# Patient Record
Sex: Male | Born: 1975 | Race: White | Hispanic: No | Marital: Married | Smoking: Current every day smoker
Health system: Southern US, Community
[De-identification: ages and names within clinical notes are randomized; demographics above are authoritative.]

## PROBLEM LIST (undated history)

## (undated) DIAGNOSIS — E119 Type 2 diabetes mellitus without complications: Secondary | ICD-10-CM

## (undated) DIAGNOSIS — F1910 Other psychoactive substance abuse, uncomplicated: Secondary | ICD-10-CM

## (undated) DIAGNOSIS — I10 Essential (primary) hypertension: Secondary | ICD-10-CM

## (undated) DIAGNOSIS — F419 Anxiety disorder, unspecified: Secondary | ICD-10-CM

## (undated) DIAGNOSIS — R569 Unspecified convulsions: Secondary | ICD-10-CM

## (undated) HISTORY — PX: HERNIA REPAIR: SHX51

## (undated) HISTORY — DX: Anxiety disorder, unspecified: F41.9

## (undated) HISTORY — DX: Unspecified convulsions: R56.9

## (undated) HISTORY — DX: Other psychoactive substance abuse, uncomplicated: F19.10

---

## 2005-03-18 ENCOUNTER — Emergency Department (HOSPITAL_COMMUNITY): Admission: EM | Admit: 2005-03-18 | Discharge: 2005-03-18 | Payer: Self-pay | Admitting: Emergency Medicine

## 2005-03-31 ENCOUNTER — Encounter: Payer: Self-pay | Admitting: Internal Medicine

## 2006-02-16 ENCOUNTER — Ambulatory Visit: Payer: Self-pay | Admitting: Internal Medicine

## 2006-02-18 ENCOUNTER — Ambulatory Visit (HOSPITAL_COMMUNITY): Admission: RE | Admit: 2006-02-18 | Discharge: 2006-02-18 | Payer: Self-pay | Admitting: Internal Medicine

## 2006-03-02 ENCOUNTER — Ambulatory Visit: Payer: Self-pay | Admitting: Internal Medicine

## 2006-08-29 ENCOUNTER — Ambulatory Visit (HOSPITAL_COMMUNITY): Admission: RE | Admit: 2006-08-29 | Discharge: 2006-08-29 | Payer: Self-pay | Admitting: Internal Medicine

## 2006-08-29 ENCOUNTER — Ambulatory Visit: Payer: Self-pay | Admitting: Internal Medicine

## 2007-02-06 ENCOUNTER — Emergency Department (HOSPITAL_COMMUNITY): Admission: EM | Admit: 2007-02-06 | Discharge: 2007-02-07 | Payer: Self-pay | Admitting: Emergency Medicine

## 2008-01-02 ENCOUNTER — Ambulatory Visit: Payer: Self-pay | Admitting: Internal Medicine

## 2008-01-02 DIAGNOSIS — M545 Low back pain: Secondary | ICD-10-CM

## 2008-01-02 DIAGNOSIS — E299 Testicular dysfunction, unspecified: Secondary | ICD-10-CM | POA: Insufficient documentation

## 2008-01-08 ENCOUNTER — Telehealth: Payer: Self-pay | Admitting: Internal Medicine

## 2008-01-10 ENCOUNTER — Encounter: Payer: Self-pay | Admitting: Internal Medicine

## 2008-01-30 ENCOUNTER — Encounter: Payer: Self-pay | Admitting: Internal Medicine

## 2008-05-20 ENCOUNTER — Telehealth: Payer: Self-pay | Admitting: Internal Medicine

## 2008-05-20 ENCOUNTER — Ambulatory Visit: Payer: Self-pay | Admitting: Internal Medicine

## 2008-05-20 ENCOUNTER — Encounter (INDEPENDENT_AMBULATORY_CARE_PROVIDER_SITE_OTHER): Payer: Self-pay | Admitting: *Deleted

## 2008-05-20 DIAGNOSIS — I10 Essential (primary) hypertension: Secondary | ICD-10-CM | POA: Insufficient documentation

## 2008-05-20 DIAGNOSIS — F410 Panic disorder [episodic paroxysmal anxiety] without agoraphobia: Secondary | ICD-10-CM

## 2008-05-20 DIAGNOSIS — F1010 Alcohol abuse, uncomplicated: Secondary | ICD-10-CM | POA: Insufficient documentation

## 2008-05-20 DIAGNOSIS — F17200 Nicotine dependence, unspecified, uncomplicated: Secondary | ICD-10-CM | POA: Insufficient documentation

## 2008-05-20 LAB — CONVERTED CEMR LAB
ALT: 60 units/L — ABNORMAL HIGH (ref 0–53)
AST: 44 units/L — ABNORMAL HIGH (ref 0–37)
Alkaline Phosphatase: 56 units/L (ref 39–117)
Basophils Absolute: 0.1 10*3/uL (ref 0.0–0.1)
Basophils Relative: 0.8 % (ref 0.0–1.0)
Bilirubin, Direct: 0.1 mg/dL (ref 0.0–0.3)
CO2: 29 meq/L (ref 19–32)
Chloride: 102 meq/L (ref 96–112)
Creatinine, Ser: 1.3 mg/dL (ref 0.4–1.5)
HDL: 33.1 mg/dL — ABNORMAL LOW (ref >39.0)
Lymphocytes Relative: 28.5 % (ref 12.0–46.0)
MCHC: 35.5 g/dL (ref 30.0–36.0)
Neutrophils Relative %: 64.5 % (ref 43.0–77.0)
Platelets: 123 10*3/uL — ABNORMAL LOW (ref 150–400)
Potassium: 4.4 meq/L (ref 3.5–5.1)
RBC: 5.26 M/uL (ref 4.22–5.81)
TSH: 2.62 microintl units/mL (ref 0.35–5.50)
Total Bilirubin: 0.9 mg/dL (ref 0.3–1.2)
VLDL: 63 mg/dL — ABNORMAL HIGH (ref 0–40)
WBC: 7.3 10*3/uL (ref 4.5–10.5)

## 2008-05-22 ENCOUNTER — Encounter: Payer: Self-pay | Admitting: Internal Medicine

## 2008-05-27 ENCOUNTER — Telehealth: Payer: Self-pay | Admitting: Internal Medicine

## 2008-05-28 ENCOUNTER — Telehealth: Payer: Self-pay | Admitting: Internal Medicine

## 2008-05-31 ENCOUNTER — Ambulatory Visit: Payer: Self-pay | Admitting: Internal Medicine

## 2008-05-31 DIAGNOSIS — R945 Abnormal results of liver function studies: Secondary | ICD-10-CM

## 2008-05-31 LAB — CONVERTED CEMR LAB
Ferritin: 342.3 ng/mL — ABNORMAL HIGH (ref 22.0–322.0)
Iron: 85 ug/dL (ref 42–165)
Saturation Ratios: 24 % (ref 20.0–50.0)
Transferrin: 253.2 mg/dL (ref >=212.0)

## 2008-06-04 ENCOUNTER — Telehealth: Payer: Self-pay | Admitting: Internal Medicine

## 2008-06-05 ENCOUNTER — Ambulatory Visit (HOSPITAL_BASED_OUTPATIENT_CLINIC_OR_DEPARTMENT_OTHER): Admission: RE | Admit: 2008-06-05 | Discharge: 2008-06-05 | Payer: Self-pay | Admitting: Internal Medicine

## 2008-06-06 ENCOUNTER — Telehealth: Payer: Self-pay | Admitting: Internal Medicine

## 2008-06-07 ENCOUNTER — Encounter: Payer: Self-pay | Admitting: Internal Medicine

## 2008-06-10 ENCOUNTER — Ambulatory Visit: Payer: Self-pay | Admitting: Cardiovascular Disease

## 2008-06-14 ENCOUNTER — Telehealth: Payer: Self-pay | Admitting: Internal Medicine

## 2008-06-18 ENCOUNTER — Ambulatory Visit: Payer: Self-pay

## 2008-06-18 ENCOUNTER — Encounter: Payer: Self-pay | Admitting: Cardiovascular Disease

## 2008-07-17 ENCOUNTER — Telehealth: Payer: Self-pay | Admitting: Internal Medicine

## 2008-08-09 ENCOUNTER — Telehealth: Payer: Self-pay | Admitting: Internal Medicine

## 2008-08-12 ENCOUNTER — Ambulatory Visit: Payer: Self-pay | Admitting: Internal Medicine

## 2008-08-12 ENCOUNTER — Encounter: Payer: Self-pay | Admitting: Family Medicine

## 2008-08-12 LAB — CONVERTED CEMR LAB
Anti Nuclear Antibody(ANA): NEGATIVE
Ceruloplasmin: 42 mg/dL (ref 21–63)
HCV Ab: NEGATIVE
Hep B Core Total Ab: NEGATIVE
Hepatitis B Surface Ag: NEGATIVE

## 2008-08-16 ENCOUNTER — Telehealth: Payer: Self-pay | Admitting: Internal Medicine

## 2008-12-30 ENCOUNTER — Emergency Department (HOSPITAL_COMMUNITY): Admission: EM | Admit: 2008-12-30 | Discharge: 2008-12-30 | Payer: Self-pay | Admitting: Emergency Medicine

## 2009-03-17 ENCOUNTER — Telehealth: Payer: Self-pay | Admitting: Internal Medicine

## 2009-04-16 ENCOUNTER — Ambulatory Visit: Payer: Self-pay | Admitting: Family Medicine

## 2009-04-30 ENCOUNTER — Ambulatory Visit: Payer: Self-pay | Admitting: Family Medicine

## 2009-04-30 LAB — CONVERTED CEMR LAB
ALT: 26 units/L (ref 0–53)
AST: 34 units/L (ref 0–37)
BUN: 15 mg/dL (ref 6–23)
CO2: 27 meq/L (ref 19–32)
Chloride: 110 meq/L (ref 96–112)
Cholesterol: 212 mg/dL — ABNORMAL HIGH (ref 0–200)
Creatinine, Ser: 1.3 mg/dL (ref 0.4–1.5)
Direct LDL: 153.1 mg/dL
Potassium: 4.2 meq/L (ref 3.5–5.1)
Total Protein: 6.7 g/dL (ref 6.0–8.3)

## 2009-05-07 ENCOUNTER — Ambulatory Visit: Payer: Self-pay | Admitting: Family Medicine

## 2009-05-07 DIAGNOSIS — E785 Hyperlipidemia, unspecified: Secondary | ICD-10-CM

## 2009-05-07 DIAGNOSIS — E1169 Type 2 diabetes mellitus with other specified complication: Secondary | ICD-10-CM | POA: Insufficient documentation

## 2009-05-07 DIAGNOSIS — R7309 Other abnormal glucose: Secondary | ICD-10-CM

## 2009-05-07 HISTORY — DX: Type 2 diabetes mellitus with other specified complication: E11.69

## 2009-05-07 LAB — CONVERTED CEMR LAB: Cholesterol, target level: 200 mg/dL

## 2009-05-20 ENCOUNTER — Telehealth: Payer: Self-pay | Admitting: Family Medicine

## 2009-06-19 ENCOUNTER — Telehealth: Payer: Self-pay | Admitting: Family Medicine

## 2009-06-20 ENCOUNTER — Ambulatory Visit: Payer: Self-pay | Admitting: Family Medicine

## 2009-07-18 ENCOUNTER — Telehealth: Payer: Self-pay | Admitting: Family Medicine

## 2009-08-08 ENCOUNTER — Ambulatory Visit: Payer: Self-pay | Admitting: Family Medicine

## 2009-08-21 ENCOUNTER — Encounter (INDEPENDENT_AMBULATORY_CARE_PROVIDER_SITE_OTHER): Payer: Self-pay | Admitting: *Deleted

## 2009-08-21 LAB — CONVERTED CEMR LAB
BUN: 17 mg/dL (ref 6–23)
CO2: 28 meq/L (ref 19–32)
GFR calc non Af Amer: 74.31 mL/min (ref >60)
Glucose, Bld: 102 mg/dL — ABNORMAL HIGH (ref 70–99)
HDL: 28.6 mg/dL — ABNORMAL LOW (ref >39.00)
Potassium: 4.5 meq/L (ref 3.5–5.1)
Sodium: 139 meq/L (ref 135–145)
Triglycerides: 516 mg/dL — ABNORMAL HIGH (ref 0.0–149.0)

## 2009-09-17 ENCOUNTER — Telehealth: Payer: Self-pay | Admitting: Family Medicine

## 2009-09-29 ENCOUNTER — Ambulatory Visit: Payer: Self-pay | Admitting: Family Medicine

## 2009-11-13 ENCOUNTER — Telehealth: Payer: Self-pay | Admitting: Family Medicine

## 2009-12-19 ENCOUNTER — Telehealth: Payer: Self-pay | Admitting: Family Medicine

## 2010-01-30 ENCOUNTER — Telehealth: Payer: Self-pay | Admitting: Family Medicine

## 2010-03-18 ENCOUNTER — Telehealth: Payer: Self-pay | Admitting: Family Medicine

## 2010-04-17 ENCOUNTER — Telehealth: Payer: Self-pay | Admitting: Family Medicine

## 2010-06-22 ENCOUNTER — Telehealth: Payer: Self-pay | Admitting: Family Medicine

## 2010-07-21 ENCOUNTER — Emergency Department: Payer: Self-pay | Admitting: Emergency Medicine

## 2010-08-03 ENCOUNTER — Telehealth: Payer: Self-pay | Admitting: Family Medicine

## 2010-09-18 ENCOUNTER — Telehealth: Payer: Self-pay | Admitting: Family Medicine

## 2010-10-14 ENCOUNTER — Encounter: Admission: RE | Admit: 2010-10-14 | Discharge: 2010-10-14 | Payer: Self-pay | Admitting: Internal Medicine

## 2010-11-17 ENCOUNTER — Telehealth: Payer: Self-pay | Admitting: Family Medicine

## 2010-12-14 ENCOUNTER — Encounter: Payer: Self-pay | Admitting: Internal Medicine

## 2010-12-18 ENCOUNTER — Telehealth: Payer: Self-pay | Admitting: Family Medicine

## 2010-12-22 NOTE — Progress Notes (Signed)
Summary: refill request for clonazepam  Phone Note Refill Request Message from:  Fax from Pharmacy  Refills Requested: Medication #1:  CLONAZEPAM 0.5 MG  TABS 1/2 to tab in the morning  and 1/2 tab in the evening.   Last Refilled: 02/02/2010 Faxed request from [facility] [address], [phone].  Initial call taken by: Lowella Petties CMA,  March 18, 2010 8:46 AM  Follow-up for Phone Call        Overdue for CPX.Marland Kitchen reifll until appt made  Follow-up by: Kerby Nora MD,  March 18, 2010 10:43 AM  Additional Follow-up for Phone Call Additional follow up Details #1::        Rx called to pharmacy unable to reach patient so will have pharmacy have patient call and schedule appt before any furthur refills.Consuello Masse CMA  Additional Follow-up by: Benny Lennert CMA [place] Dull),  March 19, 2010 12:24 PM    Prescriptions: CLONAZEPAM 0.5 MG  TABS (CLONAZEPAM) 1/2 to tab in the morning  and 1/2 tab in the evening  #30 x 0   Entered and Authorized by:   Kerby Nora MD   Signed by:   Kerby Nora MD on 03/18/2010   Method used:   Telephoned to ...       [id number]       [address]       [place], [place]  [address]       Ph: [phone]       Fax: [phone]   RxID:   [id number]

## 2010-12-22 NOTE — Progress Notes (Signed)
Summary: refill request for clonazepam  Phone Note Refill Request Message from:  Fax from Pharmacy  Refills Requested: Medication #1:  CLONAZEPAM 0.5 MG  TABS 1/2 to tab in the morning  and 1/2 tab in the evening.   Last Refilled: 08/04/2010 Faxed request from [facility] [address], [phone].  Initial call taken by: Lowella Petties CMA, AAMA,  September 18, 2010 8:03 AM  Follow-up for Phone Call        Overdue for CPX.Marland Kitchenneeds appt..one refill until appt.  Follow-up by: Kerby Nora MD,  September 18, 2010 11:09 AM  Additional Follow-up for Phone Call Additional follow up Details #1::        Rx called to pharmacy Additional Follow-up by: Benny Lennert CMA [place] Dull),  September 18, 2010 1:29 PM    Prescriptions: CLONAZEPAM 0.5 MG  TABS (CLONAZEPAM) 1/2 to tab in the morning  and 1/2 tab in the evening  #30 x 0   Entered and Authorized by:   Kerby Nora MD   Signed by:   Kerby Nora MD on 09/18/2010   Method used:   Telephoned to ...       [id number]       [address]       [place], [place]  [address]       Ph: [phone]       Fax: [phone]   RxID:   [id number]

## 2010-12-22 NOTE — Progress Notes (Signed)
Summary: refill request for clonazepam  Phone Note Refill Request Message from:  Fax from Pharmacy  Refills Requested: Medication #1:  CLONAZEPAM 0.5 MG  TABS 1/2 to tab in the morning  and 1/2 tab in the evening.   Last Refilled: 06/22/2010 Faxed requests from [facility] [address], [phone].  Initial call taken by: Lowella Petties CMA,  August 03, 2010 11:20 AM  Follow-up for Phone Call        Rx called to pharmacy Follow-up by: Benny Lennert CMA [place] Dull),  August 04, 2010 8:47 AM    Prescriptions: CLONAZEPAM 0.5 MG  TABS (CLONAZEPAM) 1/2 to tab in the morning  and 1/2 tab in the evening  #30 x 0   Entered and Authorized by:   Kerby Nora MD   Signed by:   Kerby Nora MD on 08/04/2010   Method used:   Telephoned to ...       [id number]       [address]       [place], [place]  [address]       Ph: [phone]       Fax: [phone]   RxID:   [id number]

## 2010-12-22 NOTE — Progress Notes (Signed)
Summary: clonazepam  Phone Note Refill Request Message from:  Fax from Pharmacy on Apr 17, 2010 2:30 PM  Refills Requested: Medication #1:  CLONAZEPAM 0.5 MG  TABS 1/2 to tab in the morning  and 1/2 tab in the evening.   [id number]   Method Requested: Telephone to Pharmacy Initial call taken by: Benny Lennert CMA Duncan Dull),  Apr 17, 2010 2:30 PM  Follow-up for Phone Call        OVerdue for CPX.Marland Kitchenschedule with fasting LIPIDs, CMET dX 272.0 No refills until that appt after this one.  Follow-up by: Kerby Nora MD,  Apr 17, 2010 3:20 PM  Additional Follow-up for Phone Call Additional follow up Details #1::        Rx called to pharmacy, patient advised no furhur refills until cpx appt Additional Follow-up by: Benny Lennert CMA [place] Dull),  Apr 17, 2010 3:28 PM    Prescriptions: CLONAZEPAM 0.5 MG  TABS (CLONAZEPAM) 1/2 to tab in the morning  and 1/2 tab in the evening  #30 x 0   Entered and Authorized by:   Kerby Nora MD   Signed by:   Kerby Nora MD on 04/17/2010   Method used:   Telephoned to ...       [id number]       [address]       [place], [place]  [address]       Ph: [phone]       Fax: [phone]   RxID:   [id number]

## 2010-12-22 NOTE — Progress Notes (Signed)
Summary: refill request for clonazepam  Phone Note Refill Request Message from:  Fax from Pharmacy  Refills Requested: Medication #1:  CLONAZEPAM 0.5 MG  TABS 1/2 to tab in the morning  and 1/2 tab in the evening.   Last Refilled: 12/19/2009 Faxed request from [facility] [address], [phone].  Initial call taken by: Lowella Petties CMA,  January 30, 2010 8:15 AM  Follow-up for Phone Call        Rx called to pharmacy Follow-up by: Benny Lennert CMA Duncan Dull),  February 02, 2010 7:44 AM    Prescriptions: CLONAZEPAM 0.5 MG  TABS (CLONAZEPAM) 1/2 to tab in the morning  and 1/2 tab in the evening  #30 x 0   Entered and Authorized by:   Kerby Nora MD   Signed by:   Kerby Nora MD on 02/02/2010   Method used:   Telephoned to ...       [id number]       [address]       [place], [place]  [address]       Ph: [phone]       Fax: [phone]   RxID:   [id number]

## 2010-12-22 NOTE — Progress Notes (Signed)
Summary: Rx Clonazepam  Phone Note Refill Request Call back at [phone] Message from:  [facility] on December 19, 2009 8:16 AM  Refills Requested: Medication #1:  CLONAZEPAM 0.5 MG  TABS 1/2 to tab in the morning  and 1/2 tab in the evening.   Last Refilled: 11/13/2009 Received faxed refill request, please advise   Method Requested: Telephone to Pharmacy Initial call taken by: Linde Gillis CMA [place] Dull),  December 19, 2009 8:17 AM  Follow-up for Phone Call        Rx called to pharmacy Follow-up by: Linde Gillis CMA Duncan Dull),  December 19, 2009 5:29 PM    Prescriptions: CLONAZEPAM 0.5 MG  TABS (CLONAZEPAM) 1/2 to tab in the morning  and 1/2 tab in the evening  #30 x 0   Entered and Authorized by:   Kerby Nora MD   Signed by:   Kerby Nora MD on 12/19/2009   Method used:   Telephoned to ...       [id number]       [address]       [place], [place]  [address]       Ph: [phone]       Fax: [phone]   RxID:   [id number]

## 2010-12-22 NOTE — Progress Notes (Signed)
Summary: clonazepam  Phone Note Refill Request Message from:  Fax from Pharmacy on June 22, 2010 3:58 PM  Refills Requested: Medication #1:  CLONAZEPAM 0.5 MG  TABS 1/2 to tab in the morning  and 1/2 tab in the evening.   Supply Requested: [facility] month [facility]   Method Requested: Telephone to Pharmacy Initial call taken by: Benny Lennert CMA Duncan Dull),  June 22, 2010 3:59 PM  Follow-up for Phone Call        Rx called to pharmacy Follow-up by: Sydell Axon LPN,  June 22, 2010 5:05 PM    Prescriptions: CLONAZEPAM 0.5 MG  TABS (CLONAZEPAM) 1/2 to tab in the morning  and 1/2 tab in the evening  #30 x 0   Entered and Authorized by:   Kerby Nora MD   Signed by:   Kerby Nora MD on 06/22/2010   Method used:   Telephoned to ...       [id number]       [address]       [place], [place]  [address]       Ph: [phone]       Fax: [phone]   RxID:   [id number]

## 2010-12-24 NOTE — Progress Notes (Signed)
Summary: clonazepam  Phone Note Refill Request Message from:  Fax from Pharmacy on December 18, 2010 10:37 AM  Refills Requested: Medication #1:  CLONAZEPAM 0.5 MG  TABS 1/2 to tab in the morning  and 1/2 tab in the evening.   Last Refilled: 11/17/2010 Refill request from [facility] [address] [phone]  Initial call taken by: Melody Comas,  December 18, 2010 10:37 AM  Follow-up for Phone Call        Overdue for CPX.Marland Kitchen please schedule with fasting labs prior  No reiflls until appt made after this one.  Follow-up by: Kerby Nora MD,  December 18, 2010 2:09 PM  Additional Follow-up for Phone Call Additional follow up Details #1::        Rx called to pharmacy  Patient has appt on 01-06-2011 Additional Follow-up by: Benny Lennert CMA Duncan Dull),  December 18, 2010 2:41 PM    Prescriptions: CLONAZEPAM 0.5 MG  TABS (CLONAZEPAM) 1/2 to tab in the morning  and 1/2 tab in the evening  #30 x 0   Entered and Authorized by:   Kerby Nora MD   Signed by:   Kerby Nora MD on 12/18/2010   Method used:   Telephoned to ...       [id number]       [address]       [place], [place]  [address]       Ph: [phone]       Fax: [phone]   RxID:   [id number]

## 2010-12-24 NOTE — Progress Notes (Signed)
Summary: Rx Clonzaepam  Phone Note Refill Request Call back at [phone] Message from:  [facility] on November 17, 2010 9:31 AM  Refills Requested: Medication #1:  CLONAZEPAM 0.5 MG  TABS 1/2 to tab in the morning  and 1/2 tab in the evening.   Last Refilled: 09/18/2010 Received faxed refill request please advise.   Method Requested: Telephone to Pharmacy Initial call taken by: Linde Gillis CMA Duncan Dull),  November 17, 2010 9:31 AM  Follow-up for Phone Call        Rx called to pharmacy Follow-up by: Benny Lennert CMA Duncan Dull),  November 17, 2010 10:13 AM    Prescriptions: CLONAZEPAM 0.5 MG  TABS (CLONAZEPAM) 1/2 to tab in the morning  and 1/2 tab in the evening  #30 x 0   Entered and Authorized by:   Kerby Nora MD   Signed by:   Kerby Nora MD on 11/17/2010   Method used:   Telephoned to ...       [id number]       [address]       [place], [place]  [address]       Ph: [phone]       Fax: [phone]   RxID:   [id number]

## 2011-01-06 ENCOUNTER — Encounter: Payer: Self-pay | Admitting: Family Medicine

## 2011-01-06 ENCOUNTER — Ambulatory Visit (INDEPENDENT_AMBULATORY_CARE_PROVIDER_SITE_OTHER): Payer: 59 | Admitting: Family Medicine

## 2011-01-06 ENCOUNTER — Other Ambulatory Visit: Payer: Self-pay | Admitting: Family Medicine

## 2011-01-06 DIAGNOSIS — R945 Abnormal results of liver function studies: Secondary | ICD-10-CM

## 2011-01-06 DIAGNOSIS — R7309 Other abnormal glucose: Secondary | ICD-10-CM

## 2011-01-06 DIAGNOSIS — M109 Gout, unspecified: Secondary | ICD-10-CM | POA: Insufficient documentation

## 2011-01-06 DIAGNOSIS — E785 Hyperlipidemia, unspecified: Secondary | ICD-10-CM

## 2011-01-06 DIAGNOSIS — Z0000 Encounter for general adult medical examination without abnormal findings: Secondary | ICD-10-CM

## 2011-01-06 LAB — HEPATIC FUNCTION PANEL
AST: 48 U/L — ABNORMAL HIGH (ref 0–37)
Albumin: 4 g/dL (ref 3.5–5.2)
Alkaline Phosphatase: 69 U/L (ref 39–117)
Bilirubin, Direct: 0.1 mg/dL (ref 0.0–0.3)
Total Bilirubin: 0.4 mg/dL (ref 0.3–1.2)

## 2011-01-06 LAB — LIPID PANEL
Total CHOL/HDL Ratio: 7
VLDL: 87.4 mg/dL — ABNORMAL HIGH (ref 0.0–40.0)

## 2011-01-06 LAB — BASIC METABOLIC PANEL
BUN: 18 mg/dL (ref 6–23)
Chloride: 104 mEq/L (ref 96–112)
Creatinine, Ser: 1.2 mg/dL (ref 0.4–1.5)
Glucose, Bld: 98 mg/dL (ref 70–99)
Potassium: 4.3 mEq/L (ref 3.5–5.1)

## 2011-01-13 ENCOUNTER — Encounter (INDEPENDENT_AMBULATORY_CARE_PROVIDER_SITE_OTHER): Payer: Self-pay | Admitting: *Deleted

## 2011-01-13 NOTE — Assessment & Plan Note (Signed)
Summary: cpx   Vital Signs:  Patient profile:   35 year old male Height:      68.5 inches Weight:      206 pounds BMI:     30.98 Temp:     98.3 degrees F oral Pulse rate:   84 / minute Pulse rhythm:   regular BP sitting:   110 / 80  (left arm) Cuff size:   regular  Vitals Entered By: Benny Lennert CMA Duncan Dull) (January 06, 2011 8:14 AM)  History of Present Illness: Chief complaint cpx  The patient is here for annual wellness exam and preventative care.   Has not been seen since 2010... overdue for labs etc.   HTN, well controlled on clonidine, well controlled at home as well.   High cholesterol: Due for reeval.  Poor diet...eating a lot of fast food.  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  In past severely high triglycerides.  Panic attacks; well controlled  on sertraline and using clonazepam  daily in AMs   Prediabetes and elevated  transaninases: Due for reeval.  Alcohol use currently: Has stopped drinking beer... 5 mixed drinks a night... every night.  . 2 months ago left foot.. red, warm , very painful. No known injury.  X-ray of foot at urgent care nml. Told likely gout...used indocin for pain until gone.  2 weeks later notice nodule appearing. No current pain.   Lipid Management History:      Positive NCEP/ATP III risk factors include HDL cholesterol less than 40, current tobacco user, and hypertension.  Negative NCEP/ATP III risk factors include male age less than 91 years old.        The patient states that he knows about the "Therapeutic Lifestyle Change" diet.  His compliance with the TLC diet is poor.     Preventive Screening-Counseling & Management  Alcohol-Tobacco     Alcohol drinks/day: 5     >5/day in last 3 mos: yes     Alcohol Counseling: to decrease amount and/or frequency of alcohol intake     Feels need to cut down: yes     Feels annoyed by complaints: no     Feels guilty re: drinking: no     Needs 'eye opener' in am: no  Smoking Status: current     Smoking Cessation Counseling: yes     Smoke Cessation Stage: precontemplative     Packs/Day: 2 packs a day     Tobacco Counseling: to quit use of tobacco products  Problems Prior to Update: 1)  Rib Pain, Left Sided  (ICD-786.50) 2)  Oth Spec Pers Hx Presenting Hazards Health Oth  (ICD-V15.89) 3)  Prediabetes  (ICD-790.29) 4)  Hyperlipidemia  (ICD-272.4) 5)  Liver Function Tests, Abnormal  (ICD-794.8) 6)  Alcohol Abuse  (ICD-305.00) 7)  Tobacco Abuse  (ICD-305.1) 8)  Chest Pain  (ICD-786.50) 9)  Hypertension  (ICD-401.9) 10)  Panic Attack  (ICD-300.01) 11)  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 1 Tab By Mouth Daily 2)  Clonidine Hcl 0.1 Mg  Tabs (Clonidine Hcl) .... Take 1 Tablet By Mouth Two Times A Day (Office Visit Required For Refills) 3)  Clonazepam 0.5 Mg  Tabs (Clonazepam) .... 1/2 To Tab in The Morning  and 1/2 Tab in The Evening  Allergies (verified): No Known Drug Allergies  Past History:  Past medical, surgical, family and social histories (including risk factors) reviewed, and no changes noted (except as noted below).  Past Medical History: Reviewed history from 08/12/2008 and no changes required. History of Seizures as adolescent Chronic neck pain Chronic low back pain Alcohol abuse Tobacco abuse Anxiety/panic attacks Atypical chest pain - normal stress echo 06/18/08 Abnormal LFT's  Past Surgical History: Reviewed history from 08/12/2008 and no changes required. 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 Mother is 61 - DM II Father 73 - alcoholism, hyperlipidemia, CAD, CHF  Social History: Reviewed history from 04/16/2009 and no changes required. Occupation:  Works for [employer],  plumbing service Married 2 children: healthy Current Smoker - 10 cigarettes a day Alcohol use-1 case a week!! Drug use-no (denies cocaine or amphetamines) Regular exercise-yes Diet: fast food, skips meals Packs/Day:  2 packs a day  Review of Systems General:  Denies fatigue. CV:  Denies chest pain or discomfort. Resp:  Denies shortness of breath; no daily cough. GI:  Denies abdominal pain and bloody stools. GU:  Denies dysuria.  Physical Exam  General:  Overweight appearing male with central obesity Ears:  External ear exam shows no significant lesions or deformities.  Otoscopic examination reveals clear canals, tympanic membranes are intact bilaterally without bulging, retraction, inflammation or discharge. Hearing is grossly normal bilaterally. Nose:  External nasal examination shows no deformity or inflammation. Nasal mucosa are pink and moist without lesions or exudates. Mouth:  Oral mucosa and oropharynx without lesions or exudates.  Teeth in good repair. Neck:  no carotid bruit or thyromegaly no cervical or supraclavicular lymphadenopathy  Lungs:  Normal respiratory effort, chest expands symmetrically. Lungs are clear to auscultation, no crackles or wheezes. Heart:  Normal rate and regular rhythm. S1 and S2 normal without gallop, murmur, click, rub or other extra sounds. Abdomen:  Bowel sounds positive,abdomen soft and non-tender without masses, organomegaly or hernias noted. Genitalia:  Testes bilaterally descended without nodularity, tenderness or masses. No scrotal masses or lesions. No penis lesions or urethral discharge. Msk:  No deformity or scoliosis noted of thoracic or lumbar spine.   Firm nodule on left dorasal foot, nonmobile... consistent with gout tophi Pulses:  R and L posterior tibial pulses are full and equal bilaterally  Extremities:  no edema Neurologic:  No cranial nerve deficits noted. Station and gait are normal. Plantar reflexes are down-going bilaterally.  DTRs are symmetrical throughout. Sensory, motor and coordinative functions appear intact. Skin:  Intact without suspicious lesions or rashes Psych:  Cognition and judgment appear intact. Alert and cooperative with normal attention span and concentration. No apparent delusions, illusions, hallucinations   Impression & Recommendations:  Problem # 1:  Preventive Health Care (ICD-V70.0) The patient's preventative maintenance and recommended screening tests for an annual wellness exam were reviewed in full today. Brought up to date unless services declined.  Counselled on the importance of diet, exercise, and its role in overall health and mortality. The patient's FH and SH was reviewed, including their home life, tobacco status, and drug and alcohol status.     Problem # 2:  HYPERLIPIDEMIA (ICD-272.4) Due for reeval.  Encouraged exercise, weight loss, healthy eating habits.  Orders: TLB-Lipid Panel (80061-LIPID) TLB-Hepatic/Liver Function Pnl (80076-HEPATIC)  Problem # 3:  LIVER FUNCTION TESTS, ABNORMAL (ICD-794.8) Due for reeval. Stop or decrease alcohol. Orders: TLB-Hepatic/Liver Function Pnl (80076-HEPATIC)  Problem # 4:  ? of GOUT, UNSPECIFIED (ICD-274.9) Likely  recent gout flare. Counsled on low uric acid diet. Decrease alcohol. Will eval uric acid level... nodule likely a tophi... can consider allopurinol if level very high and not decreasing with lifestyle changes.   Orders: TLB-Uric Acid, Blood (84550-URIC)  Problem # 5:  HYPERTENSION (ICD-401.9) Well controlled. Continue current medication.  His updated medication list for this problem includes:    Clonidine Hcl 0.1 Mg Tabs (Clonidine hcl) .Marland Kitchen... Take 1 tablet by mouth two times a day (office visit required for refills)  Problem # 6:  PANIC ATTACK (ICD-300.01) Stable on current mediction.  His updated medication list for this problem includes:    Sertraline Hcl 100 Mg Tabs (Sertraline hcl) .Marland Kitchen... 1 tab by mouth daily     Clonazepam 0.5 Mg Tabs (Clonazepam) .Marland Kitchen... 1/2 to tab in the morning  and 1/2 tab in the evening  Complete Medication List: 1)  Sertraline Hcl 100 Mg Tabs (Sertraline hcl) .Marland Kitchen.. 1 tab by mouth daily 2)  Clonidine Hcl 0.1 Mg Tabs (Clonidine hcl) .... Take 1 tablet by mouth two times a day (office visit required for refills) 3)  Clonazepam 0.5 Mg Tabs (Clonazepam) .... 1/2 to tab in the morning  and 1/2 tab in the evening  Other Orders: TLB-BMP (Basic Metabolic Panel-BMET) (80048-METABOL)  Lipid Assessment/Plan:      Based on NCEP/ATP III, the patient's risk factor category is "2 or more risk factors and a calculated 10 year CAD risk of > 20%".  The patient's lipid goals are as follows: Total cholesterol goal is 200; LDL cholesterol goal is 130; HDL cholesterol goal is 40; Triglyceride goal is 150.  His LDL cholesterol goal has not been met.    Patient Instructions: 1)  Decrease alcohol intake to at least 2 drinks  minimum a day. 2)   Work on low uric acid diet.Marland Kitchen decrease alcohol, , avoid shellfish, beans, red meat. 3)  Increase exercise and work on low fat diet. Stop fast food. 4)   We will call with lab results.  Prescriptions: CLONIDINE HCL 0.1 MG  TABS (CLONIDINE HCL) Take 1 tablet by mouth two times a day (OFFICE VISIT REQUIRED FOR REFILLS)  #60 Tablet x 11   Entered and Authorized by:   Kerby Nora MD   Signed by:   Kerby Nora MD on 01/06/2011   Method used:   Electronically to        [facility] [phone]* (retail)       [address]       [place], [place]  [address]       Ph: [phone]       Fax: [phone]   RxID:   [id number] SERTRALINE HCL 100 MG TABS (SERTRALINE HCL) 1 tab by mouth daily  #30 Tablet x 11   Entered and Authorized by:   Kerby Nora MD   Signed by:   Kerby Nora MD on 01/06/2011   Method used:   Electronically to        [facility] [phone]* (retail)       [address] [place] [address]       [place], [place]   [address]       Ph: [phone]       Fax: [phone]   RxID:   [id number]    Orders Added: 1)  TLB-BMP (Basic Metabolic Panel-BMET) [80048-METABOL] 2)  TLB-Lipid Panel [80061-LIPID] 3)  TLB-Hepatic/Liver  Function Pnl [80076-HEPATIC] 4)  TLB-Uric Acid, Blood [84550-URIC] 5)  Est. Patient 18-39 years [[address]] 6)  Est. Patient Level III [[address]]    Current Allergies (reviewed today): No known allergies   Flu Vaccine Next Due:  Refused

## 2011-01-19 NOTE — Letter (Signed)
Summary: Generic Letter  [place] [facility]  [address] [place], [place] [address]   Phone: [phone]  Fax: [phone]    01/13/2011  TOUSSAINT GOLSON [address] [place], [place]  [address]  [place]  Dear Mr. Reveles,  Notify pt that triglycerides remain very elevated.. he is high risk for pancreatitis.Marland KitchenMarland KitchenLDL at goal <130 and HDL low. Start fish oil 4000 mg daily and I would like to call in rx for fenofibrate daily. LET ME KNOW if pt agreeable. No DM, nml kidney one liver test mildly elevated likely due to ETOH use...decrease alcohol use as previously discussed. Uric acid level is high... decrease foods that are high in uric acid.. like alcohol, small fish, beand, shrimp, red meats. Will recheck uric acid in 3 month Dx 274.9.Marland Kitchen. if remains elevated and/or he has additional gout flares , we can treat with a med to lower uric acid like allopurinol.  Recheck fasting LIPIDS, AST, ALT  in 3 months Dx 272.0     Please contact our office to let us no if you are agreeable to medication.         Sincerely,  Kerby Nora MD

## 2011-01-21 ENCOUNTER — Telehealth: Payer: Self-pay | Admitting: Family Medicine

## 2011-01-28 NOTE — Progress Notes (Signed)
Summary: clonazepam  Phone Note Refill Request Message from:  Fax from Pharmacy on January 21, 2011 8:17 AM  Refills Requested: Medication #1:  CLONAZEPAM 0.5 MG  TABS 1/2 to tab in the morning  and 1/2 tab in the evening   Last Refilled: 12/18/2010 Refill request from [facility] [address] [phone]  Initial call taken by: Melody Comas,  January 21, 2011 8:18 AM  Follow-up for Phone Call        Rx called to pharmacy Follow-up by: Benny Lennert CMA Duncan Dull),  January 22, 2011 2:07 PM    Prescriptions: CLONAZEPAM 0.5 MG  TABS (CLONAZEPAM) 1/2 to tab in the morning  and 1/2 tab in the evening  #30 x 0   Entered and Authorized by:   Kerby Nora MD   Signed by:   Kerby Nora MD on 01/22/2011   Method used:   Telephoned to ...       [id number]       [address]       [place], [place]  [address]       Ph: [phone]       Fax: [phone]   RxID:   [id number]

## 2011-02-21 ENCOUNTER — Other Ambulatory Visit: Payer: Self-pay | Admitting: Family Medicine

## 2011-02-22 ENCOUNTER — Other Ambulatory Visit: Payer: Self-pay | Admitting: *Deleted

## 2011-02-22 MED ORDER — CLONAZEPAM 0.5 MG PO TABS: 0.5000 mg | ORAL_TABLET | Freq: Two times a day (BID) | ORAL | Status: DC

## 2011-02-22 NOTE — Telephone Encounter (Signed)
Forward to AEB 

## 2011-02-22 NOTE — Telephone Encounter (Signed)
Call in prescription.

## 2011-02-22 NOTE — Telephone Encounter (Signed)
Will defer to AEB

## 2011-02-23 NOTE — Telephone Encounter (Signed)
Rx called to pharmacy

## 2011-03-23 ENCOUNTER — Other Ambulatory Visit: Payer: Self-pay | Admitting: *Deleted

## 2011-03-23 MED ORDER — CLONAZEPAM 0.5 MG PO TABS: ORAL_TABLET | ORAL | Status: DC

## 2011-03-23 NOTE — Telephone Encounter (Signed)
Rx called to pharmacy

## 2011-03-29 ENCOUNTER — Other Ambulatory Visit: Payer: Self-pay | Admitting: *Deleted

## 2011-03-29 MED ORDER — CLONAZEPAM 0.5 MG PO TABS: ORAL_TABLET | ORAL | Status: DC

## 2011-03-29 NOTE — Telephone Encounter (Signed)
rx called to pharmacy 

## 2011-04-06 NOTE — Assessment & Plan Note (Signed)
HEALTHCARE                            CARDIOLOGY OFFICE NOTE   NAME:Oscar Ruiz, Oscar Ruiz                   MRN:          [id number]  DATE:06/10/2008                            DOB:          1975/11/26    Oscar Ruiz is a 35 year old patient referred by Dr. Artist Pais for multiple  coronary risk factors and atypical chest pain.   The patient seems to be under a lot of stress lately.  He has been  having anxiety attacks.  They particularly happen in social places and  at work.  He actually sounds like he is somewhat agoraphobic.   The patient has had some benefit from clonazepam that he is taking  b.i.d. as well as sertraline.   He has had mild atypical chest pain.  He has had occasional episode of  substernal chest pressure, which is sharp.  It is fleeting.  It is not  long lasting.  It is nonexertional.  In regards to his blood pressure,  he has been started on clonidine over the last few weeks.   He has noted an improvement.  I am not quite sure how clonidine got  chosen as a first-line agent, particularly given its propensity for  rebound hypertension if the patient is noncompliant.   The patient unfortunately is smoking about 2 packs per day.  He also  probably drinks a little bit excessively.   Overall, I think his biggest issues seem to be anxiety, depression, and  smoking.   He is concerned about a family history of coronary artery disease.  His  grandfather was 86 or 3 years old and just had a massive heart attack.  He also has a father with heart failure who sees Dr. Antoine Poche.   His review of systems is otherwise negative.  In particular, he has not  had rapid palpitations.  He has not had a cough.  He has not had fever.  He has not had any suicidal ideation.   He has no known allergies.   He is on clonidine 0.1 b.i.d., clonazepam half a tablet 0.5 mg b.i.d.  and sertraline 25 a day.   He is married.  He has 2 children.  They are with  him today.  This is a  source of stress as well.  He works at the [employer].  He has  been there for 2 years.  He smokes 2 packs a day and drinks beer  excessively.   His past medical history is otherwise remarkable for no previous  surgeries.  He has had multiple tattoos.   He has limited hobbies.   His family history is remarkable for mother and father being alive.  His  father is 64 and has heart failure.  Grandfather died at age 38 of an  MI.   The patient drinks way too many caffeinated beverages a day, up to 5,  and he has 7-9 beers almost on a daily basis and smokes +2 packs a day.   His exam is remarkable for an anxious white male in no distress.  Blood  pressure is 136/88, pulse 86  and regular, weight 192, respiratory rate  14, and afebrile.  He has multiple tattoos.  HEENT is unremarkable.  Carotids are normal without bruit.  No lymphadenopathy, thyromegaly, or  JVP elevation.  Lungs are clear with good diaphragmatic motion.  No  wheezing.  S1 and S2, with normal heart sounds.  PMI normal.  Abdomen is  benign.  Bowel sounds positive.  No triple A.  No tenderness.  No bruit.  No hepatosplenomegaly or hepatojugular reflux.  Distal pulses are  intact.  No edema.  Neuro is nonfocal.  Skin is warm and dry.  No  muscular weakness.   His electrocardiogram shows sinus rhythm with some nonspecific ST-T wave  changes.   IMPRESSION:  1. Atypical chest pain, coronary risk factors.  Check stress      echocardiogram.  2. Smoking cessation.  I talked to the patient at length regarding      long-term risks of smoking.  He has not tried to quit in over 2      years.  He will follow up with Dr. Artist Pais given his anxiety and      depression and the fact that he is on sertraline already.  It may      be worthwhile to try Chantix once his anxiety is better controlled.  3. Hypertension, currently improved on clonidine.  I would be a little      bit concerned about long-term clonidine  in a young patient.  I      think he would probably do better with an angiotensin converting      enzyme inhibitor.  I will leave this up to Dr. Artist Pais.  4. Anxiety and depression.  The patient probably needs some      psychological counseling in addition to his clonazepam and      sertraline.  Consider referral to Dr. Dellia Cloud.   As long as the patient's stress echocardiogram is normal he will only be  seen on a p.r.n. basis.     Noralyn Pick. Eden Emms, MD, [facility]  Electronically Signed    PCN/MedQ  DD: 06/10/2008  DT: 06/11/2008  Job #: [id number]   cc:   Barbette Hair. Artist Pais, DO

## 2011-05-24 ENCOUNTER — Other Ambulatory Visit: Payer: Self-pay | Admitting: Family Medicine

## 2011-05-25 ENCOUNTER — Other Ambulatory Visit: Payer: Self-pay | Admitting: *Deleted

## 2011-05-25 MED ORDER — CLONAZEPAM 0.5 MG PO TABS: ORAL_TABLET | ORAL | Status: DC

## 2011-05-25 NOTE — Telephone Encounter (Signed)
Rx called to pharmacy

## 2011-06-22 ENCOUNTER — Other Ambulatory Visit: Payer: Self-pay | Admitting: *Deleted

## 2011-06-22 MED ORDER — CLONAZEPAM 0.5 MG PO TABS: ORAL_TABLET | ORAL | Status: DC

## 2011-06-22 NOTE — Telephone Encounter (Signed)
Last refilled 05/25/2011

## 2011-06-23 NOTE — Telephone Encounter (Signed)
rx called to pharmacy 

## 2011-07-21 ENCOUNTER — Other Ambulatory Visit: Payer: Self-pay | Admitting: *Deleted

## 2011-07-22 MED ORDER — CLONAZEPAM 0.5 MG PO TABS: ORAL_TABLET | ORAL | Status: DC

## 2011-07-22 NOTE — Telephone Encounter (Signed)
Rx phoned to pharmacy.  

## 2011-08-23 ENCOUNTER — Other Ambulatory Visit: Payer: Self-pay | Admitting: *Deleted

## 2011-08-23 MED ORDER — CLONAZEPAM 0.5 MG PO TABS: ORAL_TABLET | ORAL | Status: DC

## 2011-08-23 NOTE — Telephone Encounter (Signed)
Patient called to check the status of this refill

## 2011-08-23 NOTE — Telephone Encounter (Signed)
rx called to pharmacy 

## 2011-10-22 ENCOUNTER — Other Ambulatory Visit: Payer: Self-pay | Admitting: *Deleted

## 2011-10-22 MED ORDER — CLONAZEPAM 0.5 MG PO TABS: ORAL_TABLET | ORAL | Status: DC

## 2011-10-22 NOTE — Telephone Encounter (Signed)
rx called to pharmacy 

## 2011-11-20 ENCOUNTER — Emergency Department (HOSPITAL_COMMUNITY)
Admission: EM | Admit: 2011-11-20 | Discharge: 2011-11-21 | Disposition: A | Discharge status: Home / Self Care | Payer: 59 | Attending: Emergency Medicine | Admitting: Emergency Medicine

## 2011-11-20 DIAGNOSIS — F17200 Nicotine dependence, unspecified, uncomplicated: Secondary | ICD-10-CM | POA: Insufficient documentation

## 2011-11-20 DIAGNOSIS — IMO0002 Reserved for concepts with insufficient information to code with codable children: Secondary | ICD-10-CM | POA: Insufficient documentation

## 2011-11-20 DIAGNOSIS — I10 Essential (primary) hypertension: Secondary | ICD-10-CM | POA: Insufficient documentation

## 2011-11-20 DIAGNOSIS — W540XXA Bitten by dog, initial encounter: Secondary | ICD-10-CM | POA: Insufficient documentation

## 2011-11-20 DIAGNOSIS — R209 Unspecified disturbances of skin sensation: Secondary | ICD-10-CM | POA: Insufficient documentation

## 2011-11-20 DIAGNOSIS — S61209A Unspecified open wound of unspecified finger without damage to nail, initial encounter: Secondary | ICD-10-CM | POA: Insufficient documentation

## 2011-11-20 DIAGNOSIS — S61259A Open bite of unspecified finger without damage to nail, initial encounter: Secondary | ICD-10-CM

## 2011-11-20 DIAGNOSIS — M79609 Pain in unspecified limb: Secondary | ICD-10-CM | POA: Insufficient documentation

## 2011-11-20 DIAGNOSIS — Z79899 Other long term (current) drug therapy: Secondary | ICD-10-CM | POA: Insufficient documentation

## 2011-11-20 HISTORY — DX: Essential (primary) hypertension: I10

## 2011-11-20 MED ORDER — LIDOCAINE-EPINEPHRINE (PF) 1 %-1:200000 IJ SOLN
INTRAMUSCULAR | Status: AC
  Filled 2011-11-20: qty 10

## 2011-11-20 NOTE — ED Notes (Signed)
Pt presents with 3 dog bites to left hand. Pt states finger is numb. Bleeding controlled. Dog was pts dog and dog was put down after incident.

## 2011-11-21 ENCOUNTER — Emergency Department (HOSPITAL_COMMUNITY): Payer: 59

## 2011-11-21 MED ORDER — LIDOCAINE-EPINEPHRINE (PF) 1 %-1:200000 IJ SOLN: 30.0000 mL | Freq: Once | INTRAMUSCULAR | Status: DC

## 2011-11-21 MED ORDER — AMOXICILLIN-POT CLAVULANATE 875-125 MG PO TABS
1.0000 | ORAL_TABLET | Freq: Two times a day (BID) | ORAL | Status: DC
Start: 2011-11-21 — End: 2011-11-21
  Administered 2011-11-21: 1 via ORAL
  Filled 2011-11-21: qty 1

## 2011-11-21 MED ORDER — HYDROCODONE-ACETAMINOPHEN 5-500 MG PO TABS: 1.0000 | ORAL_TABLET | Freq: Four times a day (QID) | ORAL | Status: AC | PRN

## 2011-11-21 MED ORDER — AMOXICILLIN-POT CLAVULANATE 500-125 MG PO TABS: 1.0000 | ORAL_TABLET | Freq: Two times a day (BID) | ORAL | Status: AC

## 2011-11-21 NOTE — ED Provider Notes (Signed)
History     CSN: [id number]  Arrival date & time 11/20/11  2149   First MD Initiated Contact with Patient 11/20/11 2303      Chief Complaint  Patient presents with  . Animal Bite     HPI The patient was bit on his left hand this evening by his own dog is up-to-date on his shots.  He reports pain and numbness of his left index finger.  He has no other complaints.  Reports no difficulty bending or extending his left index finger.  He reports is on erythromycin and and is not concerned about infection.  He reports is concerned about the numbness.  Past Medical History  Diagnosis Date  . Hypertension     History reviewed. No pertinent past surgical history.  No family history on file.  History  Substance Use Topics  . Smoking status: Current Everyday Smoker -- 2.0 packs/day  . Smokeless tobacco: Not on file  . Alcohol Use: Yes      Review of Systems  All other systems reviewed and are negative.    Allergies  Review of patient's allergies indicates no known allergies.  Home Medications   Current Outpatient Rx  Name Route Sig Dispense Refill  . CLONAZEPAM 0.5 MG PO TABS  Take 1/2 tablet by mouth twice daily 30 tablet 1    Take 1/2 tablet by mouth in the morning and 1/2 in ...  . SERTRALINE HCL 100 MG PO TABS  TAKE 1 TABLET EVERY DAY 30 tablet 9    BP 147/80  Pulse 99  Temp(Src) 98.2 F (36.8 C) (Oral)  Resp 20  Ht 5\' 8"  (1.727 m)  Wt 220 lb (99.791 kg)  BMI 33.45 kg/m2  SpO2 99%  Physical Exam  Constitutional: He is oriented to person, place, and time. He appears well-developed and well-nourished.  HENT:  Head: Normocephalic.  Eyes: EOM are normal.  Neck: Normal range of motion.  Pulmonary/Chest: Effort normal.  Musculoskeletal: Normal range of motion.       Left index finger with evidence of laceration/puncture wound at the radial aspect of the left index finger on the volar surface overlying the proximal phalanx.  This is a V-shaped cut and there  does appear to be a very small cut on the ulnar aspect of the proximal phalanx of the left finger on the volar surface.  He has normal extension and flexion of his left index finger.  There is a small abrasion of the ulnar aspect of his left little finger.  He has normal perfusion of his tip of his left index finger.  He reports decreased sensation to fine touch of his left index finger distal to the wound however he can feel pressure and pain  Neurological: He is alert and oriented to person, place, and time.  Psychiatric: He has a normal mood and affect.    ED Course  Procedures (including critical care time)  Labs Reviewed - No data to display No results found.   1. Animal bite of finger       MDM  The wound was washed out extensively at the bedside after local anesthetic infiltration.  Infection warnings were given.  The patient we placed on Augmentin.  He's been instructed to stop his erythromycin.  He has been instructed to return to the ER in 24 hours for recheck of his finger and he understands the significant risk of infection given the swelling.  He has normal extensor and flexor tendon function.  I have ordered an x-ray of his finger however the patient has grown inpatient waiting for the x-ray and he would like to be discharged prior to the x-ray.  My suspicion for an underlying fracture is very low.  I suspect the majority of his "numbness" is probably more neuropraxia than it is true nerve injury        Lyanne Co, MD 11/21/11 [phone]

## 2011-11-22 ENCOUNTER — Other Ambulatory Visit: Payer: Self-pay | Admitting: *Deleted

## 2011-11-22 MED ORDER — CLONAZEPAM 0.5 MG PO TABS: ORAL_TABLET | ORAL | Status: DC

## 2012-01-18 ENCOUNTER — Other Ambulatory Visit: Payer: Self-pay | Admitting: Family Medicine

## 2012-01-19 ENCOUNTER — Other Ambulatory Visit: Payer: Self-pay | Admitting: *Deleted

## 2012-01-19 MED ORDER — CLONAZEPAM 0.5 MG PO TABS: ORAL_TABLET | ORAL | Status: DC

## 2012-01-19 NOTE — Telephone Encounter (Signed)
   Okay to refill, but only if pt schedules a Cpx in 03/2012 as he was due 2/15.

## 2012-01-20 ENCOUNTER — Telehealth: Payer: Self-pay | Admitting: Family Medicine

## 2012-01-20 NOTE — Telephone Encounter (Signed)
Medication called to pharmacy. 

## 2012-01-20 NOTE — Telephone Encounter (Signed)
Pt called, he will be out of his medications  on Friday, March 1st. Need all medication refill. Pt has scheduled an appointment w/ Dr. Patsy Lager for March 13th.  Mr France says his medication was denied because he did not have an appointment scheduled.

## 2012-01-20 NOTE — Telephone Encounter (Signed)
Left message for patient to return call and schedule appt

## 2012-01-20 NOTE — Telephone Encounter (Signed)
Patient scheduled appt with dr copland in march

## 2012-02-01 ENCOUNTER — Encounter: Payer: Self-pay | Admitting: Family Medicine

## 2012-02-02 ENCOUNTER — Ambulatory Visit (INDEPENDENT_AMBULATORY_CARE_PROVIDER_SITE_OTHER): Payer: 59 | Admitting: Family Medicine

## 2012-02-02 ENCOUNTER — Encounter: Payer: Self-pay | Admitting: Family Medicine

## 2012-02-02 VITALS — BP 120/82 | HR 76 | Temp 98.3°F | Ht 68.0 in | Wt 216.0 lb

## 2012-02-02 DIAGNOSIS — Z79899 Other long term (current) drug therapy: Secondary | ICD-10-CM

## 2012-02-02 DIAGNOSIS — I10 Essential (primary) hypertension: Secondary | ICD-10-CM

## 2012-02-02 DIAGNOSIS — E785 Hyperlipidemia, unspecified: Secondary | ICD-10-CM

## 2012-02-02 LAB — HEPATIC FUNCTION PANEL
ALT: 27 U/L (ref 0–53)
Albumin: 4.1 g/dL (ref 3.5–5.2)
Total Bilirubin: 0.3 mg/dL (ref 0.3–1.2)
Total Protein: 6.6 g/dL (ref 6.0–8.3)

## 2012-02-02 LAB — BASIC METABOLIC PANEL
BUN: 18 mg/dL (ref 6–23)
Chloride: 104 mEq/L (ref 96–112)
Glucose, Bld: 111 mg/dL — ABNORMAL HIGH (ref 70–99)
Potassium: 4.2 mEq/L (ref 3.5–5.1)

## 2012-02-02 LAB — CBC WITH DIFFERENTIAL/PLATELET
Basophils Relative: 0.4 % (ref 0.0–3.0)
Eosinophils Relative: 1.7 % (ref 0.0–5.0)
HCT: 42.8 % (ref 39.0–52.0)
Hemoglobin: 14.6 g/dL (ref 13.0–17.0)
Lymphs Abs: 2.1 10*3/uL (ref 0.7–4.0)
MCV: 92.6 fl (ref 78.0–100.0)
Monocytes Absolute: 0.7 10*3/uL (ref 0.1–1.0)
Neutro Abs: 5.7 10*3/uL (ref 1.4–7.7)
Platelets: 152 10*3/uL (ref 150.0–400.0)
WBC: 8.6 10*3/uL (ref 4.5–10.5)

## 2012-02-02 LAB — LIPID PANEL
Cholesterol: 224 mg/dL — ABNORMAL HIGH (ref 0–200)
Triglycerides: 234 mg/dL — ABNORMAL HIGH (ref 0.0–149.0)

## 2012-02-02 MED ORDER — FENOFIBRATE 160 MG PO TABS: 160.0000 mg | ORAL_TABLET | Freq: Every day | ORAL | Status: DC

## 2012-02-02 MED ORDER — CLONIDINE HCL 0.1 MG PO TABS: ORAL_TABLET | ORAL | Status: DC

## 2012-02-02 MED ORDER — CLONAZEPAM 0.5 MG PO TABS: ORAL_TABLET | ORAL | Status: DC

## 2012-02-02 MED ORDER — SERTRALINE HCL 100 MG PO TABS: 100.0000 mg | ORAL_TABLET | Freq: Every day | ORAL | Status: DC

## 2012-02-02 NOTE — Progress Notes (Signed)
Patient Name: Oscar Ruiz Date of Birth: 1976-09-05 Age: 36 y.o. Medical Record Number: [id number] Gender: male Date of Encounter: 02/02/2012  History of Present Illness:  Oscar Ruiz is a 36 y.o. very pleasant male patient who presents with the following:  HTN: Tolerating all medications without side effects Stable and at goal No CP, no sob. No HA.  BP Readings from Last 3 Encounters:  02/02/12 120/82  11/20/11 147/80  01/06/11 110/80    Basic Metabolic Panel:    Component Value Date/Time   NA 136 01/06/2011 0835   K 4.3 01/06/2011 0835   CL 104 01/06/2011 0835   CO2 26 01/06/2011 0835   BUN 18 01/06/2011 0835   CREATININE 1.2 01/06/2011 0835   GLUCOSE 98 01/06/2011 0835   CALCIUM 9.2 01/06/2011 0835   Lipids: Doing well, no recent f/u of levels. Tolerating meds fine with no SE. Panel reviewed with patient.  Lipids:    Component Value Date/Time   CHOL 222* 01/06/2011 0835   TRIG 437.0 Triglyceride is over 400; calculations on Lipids are invalid.* 01/06/2011 0835   HDL 33.80* 01/06/2011 0835   LDLDIRECT 130.4 01/06/2011 0835   VLDL 87.4* 01/06/2011 0835   CHOLHDL 7 01/06/2011 0835    Lab Results  Component Value Date   ALT 52 01/06/2011   AST 48* 01/06/2011   ALKPHOS 69 01/06/2011   BILITOT 0.4 01/06/2011   Panic attacks, stable on ssri and klonopin qday to bid  Past Medical History, Surgical History, Social History, Family History, Problem List, Medications, and Allergies have been reviewed and updated if relevant.  Review of Systems:  GEN: No acute illnesses, no fevers, chills. GI: No n/v/d, eating normally Pulm: No SOB Interactive and getting along well at home.  Otherwise, ROS is as per the HPI.   Physical Examination: Filed Vitals:   02/02/12 0807  BP: 120/82  Pulse: 76  Temp: 98.3 F (36.8 C)  TempSrc: Oral  Height: 5\' 8"  (1.727 m)  Weight: 216 lb (97.977 kg)    Body mass index is 32.84 kg/(m^2).   GEN: WDWN, NAD, Non-toxic, A & O x  3 HEENT: Atraumatic, Normocephalic. Neck supple. No masses, No LAD. Ears and Nose: No external deformity. CV: RRR, No M/G/R. No JVD. No thrill. No extra heart sounds. PULM: CTA B, no wheezes, crackles, rhonchi. No retractions. No resp. distress. No accessory muscle use. EXTR: No c/c/e NEURO Normal gait.  PSYCH: Normally interactive. Conversant. Not depressed or anxious appearing.  Calm demeanor.    Assessment and Plan:  1. HYPERLIPIDEMIA  Lipid panel  2. HYPERTENSION  Basic metabolic panel  3. Encounter for long-term (current) use of other medications  CBC with Differential, Hepatic function panel    Medications Today: Meds ordered this encounter  Medications  . clonazePAM (KLONOPIN) 0.5 MG tablet    Sig: 1/2 tab po in AM and 1/2 tab po in PM.    Dispense:  30 tablet    Refill:  0    Take 1/2 tablet by mouth in the morning and 1/2 in the evening  . DISCONTD: cloNIDine (CATAPRES) 0.1 MG tablet    Sig: Take 1 tablet two times daily    Dispense:  60 tablet    Refill:  11  . fenofibrate 160 MG tablet    Sig: Take 1 tablet (160 mg total) by mouth daily.    Dispense:  30 tablet    Refill:  11  . sertraline (ZOLOFT) 100 MG tablet  Sig: Take 1 tablet (100 mg total) by mouth daily.    Dispense:  30 tablet    Refill:  11  . cloNIDine (CATAPRES) 0.1 MG tablet    Sig: Take 1 tablet two times daily    Dispense:  60 tablet    Refill:  11    Recheck labs and refill all meds

## 2012-02-11 ENCOUNTER — Encounter: Payer: Self-pay | Admitting: *Deleted

## 2012-02-21 ENCOUNTER — Other Ambulatory Visit: Payer: Self-pay | Admitting: *Deleted

## 2012-02-21 MED ORDER — CLONAZEPAM 0.5 MG PO TABS: ORAL_TABLET | ORAL | Status: DC

## 2012-02-21 NOTE — Telephone Encounter (Signed)
Last filled 01-20-2012 and last seen 02-02-2012

## 2012-02-22 NOTE — Telephone Encounter (Signed)
rx called to pharmacy 

## 2012-03-21 ENCOUNTER — Other Ambulatory Visit: Payer: Self-pay | Admitting: *Deleted

## 2012-03-21 MED ORDER — CLONAZEPAM 0.5 MG PO TABS: ORAL_TABLET | ORAL | Status: DC

## 2012-03-21 NOTE — Telephone Encounter (Signed)
rx called to pharmacy 

## 2012-04-19 ENCOUNTER — Other Ambulatory Visit: Payer: Self-pay | Admitting: *Deleted

## 2012-04-19 NOTE — Telephone Encounter (Signed)
Faxed refill request from [facility] [address], last filled date not given.

## 2012-04-20 MED ORDER — CLONAZEPAM 0.5 MG PO TABS: ORAL_TABLET | ORAL | Status: DC

## 2012-04-20 NOTE — Telephone Encounter (Signed)
[facility] called to [facility].

## 2012-05-22 ENCOUNTER — Other Ambulatory Visit: Payer: Self-pay | Admitting: *Deleted

## 2012-05-22 MED ORDER — CLONAZEPAM 0.5 MG PO TABS: ORAL_TABLET | ORAL | Status: DC

## 2012-06-22 ENCOUNTER — Other Ambulatory Visit: Payer: Self-pay | Admitting: *Deleted

## 2012-06-22 MED ORDER — CLONAZEPAM 0.5 MG PO TABS: ORAL_TABLET | ORAL | Status: DC

## 2012-06-22 NOTE — Telephone Encounter (Signed)
LAST REFILLED 05-22-2012

## 2012-06-22 NOTE — Telephone Encounter (Signed)
Rx called in as directed.   

## 2012-07-21 ENCOUNTER — Other Ambulatory Visit: Payer: Self-pay | Admitting: Family Medicine

## 2012-07-21 MED ORDER — CLONAZEPAM 0.5 MG PO TABS: ORAL_TABLET | ORAL | Status: DC

## 2012-07-21 NOTE — Telephone Encounter (Signed)
Pt needs clonazepam called in to pharmacy only has 3 left and will not make it through the weekend. Please advise.

## 2012-07-21 NOTE — Telephone Encounter (Signed)
Medication phoned to [facility] as instructed.Patient notified as instructed by telephone.

## 2012-08-21 ENCOUNTER — Other Ambulatory Visit: Payer: Self-pay | Admitting: Family Medicine

## 2012-08-21 NOTE — Telephone Encounter (Signed)
Electronic refill request.  Please advise. 

## 2012-08-22 NOTE — Telephone Encounter (Signed)
Medication phoned to [facility] as instructed.  

## 2012-09-18 ENCOUNTER — Other Ambulatory Visit: Payer: Self-pay | Admitting: Family Medicine

## 2012-09-18 NOTE — Telephone Encounter (Signed)
Received refill request electronically.  Last office visit 02/02/12. Is it okay to refill medication?

## 2012-09-19 NOTE — Telephone Encounter (Signed)
RX called into pharmacy

## 2012-10-17 ENCOUNTER — Other Ambulatory Visit: Payer: Self-pay | Admitting: Family Medicine

## 2012-10-18 NOTE — Telephone Encounter (Signed)
rx called to pharmacy 

## 2012-11-19 ENCOUNTER — Other Ambulatory Visit: Payer: Self-pay | Admitting: Family Medicine

## 2012-11-21 ENCOUNTER — Other Ambulatory Visit: Payer: Self-pay | Admitting: Family Medicine

## 2012-11-21 NOTE — Telephone Encounter (Signed)
Pt left vm stating that [facility] informed him that they have sent a refill request x 2 for clonazepam 0.5 mg tablet without any response from our office.  Dr Ermalene Searing approved refill on 11/19/12.  Refill called in to pharmacy and pt notified.

## 2012-11-21 NOTE — Telephone Encounter (Signed)
Medication phoned [facility] as instructed.

## 2012-12-17 ENCOUNTER — Other Ambulatory Visit: Payer: Self-pay | Admitting: Family Medicine

## 2012-12-19 NOTE — Telephone Encounter (Signed)
[facility]ill [facility] [place] [address]

## 2013-01-20 ENCOUNTER — Other Ambulatory Visit: Payer: Self-pay | Admitting: Family Medicine

## 2013-01-22 NOTE — Telephone Encounter (Signed)
[facility] electronically request refill Clonazepam.Please advise.

## 2013-01-23 NOTE — Telephone Encounter (Signed)
[facility] 

## 2013-02-14 ENCOUNTER — Other Ambulatory Visit: Payer: Self-pay | Admitting: Family Medicine

## 2013-02-15 NOTE — Telephone Encounter (Signed)
Notify pt he is not due until 4/3.Marland Kitchen Will send in pharmacy not fill until then.

## 2013-02-15 NOTE — Telephone Encounter (Signed)
Advised patient as instructed, [facility]

## 2013-03-18 ENCOUNTER — Other Ambulatory Visit: Payer: Self-pay | Admitting: Family Medicine

## 2013-03-20 NOTE — Telephone Encounter (Signed)
Overdue for CPX.  Refill until appt made.

## 2013-03-20 NOTE — Telephone Encounter (Signed)
rx called to pharmacy 

## 2013-04-20 ENCOUNTER — Other Ambulatory Visit: Payer: Self-pay | Admitting: Family Medicine

## 2013-04-20 MED ORDER — FENOFIBRATE 160 MG PO TABS: ORAL_TABLET | ORAL | Status: DC

## 2013-04-20 MED ORDER — SERTRALINE HCL 100 MG PO TABS: ORAL_TABLET | ORAL | Status: DC

## 2013-04-20 MED ORDER — CLONIDINE HCL 0.1 MG PO TABS: ORAL_TABLET | ORAL | Status: DC

## 2013-04-20 NOTE — Telephone Encounter (Signed)
Patient has appt for next Friday scheduled. Refilled all medication requested except for clonazepam b/c patient said he could wait until appt

## 2013-04-20 NOTE — Telephone Encounter (Signed)
Overdue for CPX.Marland Kitchen He needs to schedule this prior to further refills.

## 2013-04-20 NOTE — Telephone Encounter (Signed)
Left message for patient to return my call to set up appt

## 2013-04-20 NOTE — Telephone Encounter (Signed)
Patient not seen in over 1 year is it okay to refill

## 2013-04-26 ENCOUNTER — Encounter: Payer: Self-pay | Admitting: Radiology

## 2013-04-27 ENCOUNTER — Encounter: Payer: Self-pay | Admitting: Family Medicine

## 2013-04-27 ENCOUNTER — Ambulatory Visit (INDEPENDENT_AMBULATORY_CARE_PROVIDER_SITE_OTHER): Payer: 59 | Admitting: Family Medicine

## 2013-04-27 VITALS — BP 120/80 | HR 84 | Temp 98.2°F | Wt 213.0 lb

## 2013-04-27 DIAGNOSIS — I10 Essential (primary) hypertension: Secondary | ICD-10-CM

## 2013-04-27 DIAGNOSIS — M109 Gout, unspecified: Secondary | ICD-10-CM

## 2013-04-27 DIAGNOSIS — F411 Generalized anxiety disorder: Secondary | ICD-10-CM

## 2013-04-27 DIAGNOSIS — M1A9XX1 Chronic gout, unspecified, with tophus (tophi): Secondary | ICD-10-CM

## 2013-04-27 DIAGNOSIS — R7309 Other abnormal glucose: Secondary | ICD-10-CM

## 2013-04-27 DIAGNOSIS — E785 Hyperlipidemia, unspecified: Secondary | ICD-10-CM

## 2013-04-27 DIAGNOSIS — R945 Abnormal results of liver function studies: Secondary | ICD-10-CM

## 2013-04-27 LAB — COMPREHENSIVE METABOLIC PANEL
Albumin: 4.1 g/dL (ref 3.5–5.2)
BUN: 14 mg/dL (ref 6–23)
CO2: 24 mEq/L (ref 19–32)
Calcium: 9.3 mg/dL (ref 8.4–10.5)
Chloride: 103 mEq/L (ref 96–112)
Creatinine, Ser: 1.5 mg/dL (ref 0.4–1.5)
GFR: 56.19 mL/min — ABNORMAL LOW (ref >60.00)
Glucose, Bld: 121 mg/dL — ABNORMAL HIGH (ref 70–99)
Potassium: 5 mEq/L (ref 3.5–5.1)

## 2013-04-27 LAB — LIPID PANEL
HDL: 22.4 mg/dL — ABNORMAL LOW (ref >39.00)
Triglycerides: 272 mg/dL — ABNORMAL HIGH (ref 0.0–149.0)

## 2013-04-27 MED ORDER — FENOFIBRATE 160 MG PO TABS: ORAL_TABLET | ORAL | Status: DC

## 2013-04-27 MED ORDER — CLONIDINE HCL 0.1 MG PO TABS: ORAL_TABLET | ORAL | Status: DC

## 2013-04-27 MED ORDER — CLONAZEPAM 0.5 MG PO TABS: ORAL_TABLET | ORAL | Status: DC

## 2013-04-27 MED ORDER — SERTRALINE HCL 100 MG PO TABS: ORAL_TABLET | ORAL | Status: DC

## 2013-04-27 NOTE — Assessment & Plan Note (Signed)
Well controlled. Continue current medication. Possible high Bp when feeling dizzy at mid day vs anxiety... Will check BPs then and call with measurements

## 2013-04-27 NOTE — Assessment & Plan Note (Signed)
Due for re-eval. 

## 2013-04-27 NOTE — Patient Instructions (Addendum)
Schedule CPX with NO fasting labs prior in next few months. Work ing healthy eating.. No fast food, increase fruit and veggies, increase water. Stop soda. Start exercise 3-5 times a week. Check BPs mid day.. To see if BP is elevating then. Try to decrease clonazepam to 1/2 tab in morning only.. Decrease slowly.

## 2013-04-27 NOTE — Progress Notes (Signed)
  Subjective:    Patient ID: Oscar Ruiz, male    DOB: [birth year], 37 y.o.   MRN: [id number]  HPI 37 year old male presents for med refill follow up  Hypertension:   Well controlled on clonidine  Using medication without problems or lightheadedness: Daily feels lightheaded at midday.. ? If medicaiton wearing off at that time. Chest pain with exertion:None Edema:None Short of breath:None Average home BPs: Not checking Other issues:  ALSo skipping meals.  Elevated Cholesterol:  Due for re-eval. On fenofibrate Lab Results  Component Value Date   CHOL 224* 02/02/2012   HDL 28.70* 02/02/2012   LDLDIRECT 147.0 02/02/2012   TRIG 234.0* 02/02/2012   CHOLHDL 8 02/02/2012    Using medications without problems: None Muscle aches: None Diet compliance: Poor Exercise:None Wt Readings from Last 3 Encounters:  04/27/13 213 lb (96.616 kg)  02/02/12 216 lb (97.977 kg)  11/20/11 220 lb (99.791 kg)    Other complaints:  Panic attack, anxiety: Fairly well controlled on clonazepam (using 1/2 tab BID) and sertraline  100 mg daily   Review of Systems  Constitutional: Negative for fever, fatigue and unexpected weight change.  HENT: Negative for ear pain, congestion, sore throat, rhinorrhea, trouble swallowing and postnasal drip.   Eyes: Negative for pain.  Respiratory: Negative for cough, shortness of breath and wheezing.   Cardiovascular: Negative for chest pain, palpitations and leg swelling.  Gastrointestinal: Negative for nausea, abdominal pain, diarrhea, constipation and blood in stool.  Genitourinary: Negative for dysuria, urgency, hematuria, discharge, penile swelling, scrotal swelling, difficulty urinating, penile pain and testicular pain.  Skin: Negative for rash.  Neurological: Positive for light-headedness. Negative for syncope, weakness, numbness and headaches.  Psychiatric/Behavioral: Negative for behavioral problems and dysphoric mood. The patient is not nervous/anxious.         Objective:   Physical Exam  Constitutional: Vital signs are normal. He appears well-developed and well-nourished.  HENT:  Head: Normocephalic.  Right Ear: Hearing normal.  Left Ear: Hearing normal.  Nose: Nose normal.  Mouth/Throat: Oropharynx is clear and moist and mucous membranes are normal.  Neck: Trachea normal. Carotid bruit is not present. No mass and no thyromegaly present.  Cardiovascular: Normal rate, regular rhythm and normal pulses.  Exam reveals no gallop, no distant heart sounds and no friction rub.   No murmur heard. No peripheral edema  Pulmonary/Chest: Effort normal and breath sounds normal. No respiratory distress.  Skin: Skin is warm, dry and intact. No rash noted.  Psychiatric: He has a normal mood and affect. His speech is normal and behavior is normal. Thought content normal.          Assessment & Plan:

## 2013-04-27 NOTE — Assessment & Plan Note (Signed)
Fairly good control on current regimen..he will try to decrease clonazepam if possible.  ? Anxiety at mid day.. Consider different SSRI so we can increase it.

## 2013-05-01 ENCOUNTER — Other Ambulatory Visit: Payer: 59

## 2013-05-04 ENCOUNTER — Other Ambulatory Visit: Payer: Self-pay | Admitting: Family Medicine

## 2013-05-04 ENCOUNTER — Other Ambulatory Visit (INDEPENDENT_AMBULATORY_CARE_PROVIDER_SITE_OTHER): Payer: 59

## 2013-05-04 DIAGNOSIS — R7303 Prediabetes: Secondary | ICD-10-CM

## 2013-05-04 DIAGNOSIS — R7309 Other abnormal glucose: Secondary | ICD-10-CM

## 2013-05-04 LAB — HEMOGLOBIN A1C: Hgb A1c MFr Bld: 6.1 % (ref 4.6–6.5)

## 2013-05-08 ENCOUNTER — Telehealth: Payer: Self-pay | Admitting: Family Medicine

## 2013-05-08 DIAGNOSIS — R7303 Prediabetes: Secondary | ICD-10-CM

## 2013-05-08 DIAGNOSIS — E7800 Pure hypercholesterolemia, unspecified: Secondary | ICD-10-CM

## 2013-05-08 MED ORDER — SERTRALINE HCL 100 MG PO TABS: ORAL_TABLET | ORAL | Status: DC

## 2013-05-08 MED ORDER — CLONIDINE HCL 0.1 MG PO TABS: ORAL_TABLET | ORAL | Status: DC

## 2013-05-08 MED ORDER — FENOFIBRATE 160 MG PO TABS: ORAL_TABLET | ORAL | Status: DC

## 2013-05-08 MED ORDER — ATORVASTATIN CALCIUM 40 MG PO TABS: 40.0000 mg | ORAL_TABLET | Freq: Every day | ORAL | Status: DC

## 2013-05-08 NOTE — Addendum Note (Signed)
Addended by: Consuello Masse on: 05/08/2013 08:51 AM   Modules accepted: Orders

## 2013-05-08 NOTE — Telephone Encounter (Signed)
Rx sent in.. Please make sure pt has appt scheduled for labs in 3 months.

## 2013-05-08 NOTE — Telephone Encounter (Signed)
Message copied by Excell Seltzer on Tue May 08, 2013  3:40 PM ------      Message from: Consuello Masse      Created: Tue May 08, 2013  9:01 AM       Patient is open to cholesterol medication but, would like just one pill. Patient does not  want nutritionist referral.      [facility] [address] and info mailed ------

## 2013-05-08 NOTE — Telephone Encounter (Signed)
Advised patient that script has been sent to pharmacy.  He declined to schedule lab appt, said he will call back.  Advised him he needs the labs in mid September.

## 2013-05-15 ENCOUNTER — Encounter: Payer: Self-pay | Admitting: Family Medicine

## 2013-05-21 ENCOUNTER — Other Ambulatory Visit: Payer: Self-pay | Admitting: Family Medicine

## 2013-06-09 ENCOUNTER — Other Ambulatory Visit: Payer: Self-pay | Admitting: Family Medicine

## 2013-06-11 NOTE — Telephone Encounter (Signed)
Received refill electronically from pharmacy. Last office visit 04/27/13. Is it okay to refill?

## 2013-06-12 NOTE — Telephone Encounter (Signed)
rx called to pharmacy 

## 2013-07-20 ENCOUNTER — Other Ambulatory Visit: Payer: Self-pay | Admitting: Family Medicine

## 2013-07-20 NOTE — Telephone Encounter (Signed)
Rx called to pharmacy

## 2013-07-20 NOTE — Telephone Encounter (Addendum)
Received refill request electronically.  Last office visit 6/ 6/14. Is it okay to refill?

## 2013-09-10 ENCOUNTER — Other Ambulatory Visit: Payer: Self-pay | Admitting: Family Medicine

## 2013-09-10 NOTE — Telephone Encounter (Signed)
Last office visit 04/27/2013.  Ok to refill?

## 2013-09-11 NOTE — Telephone Encounter (Signed)
Called to [facility] [address] 

## 2013-10-12 ENCOUNTER — Telehealth: Payer: Self-pay | Admitting: Family Medicine

## 2013-10-15 ENCOUNTER — Encounter: Payer: Self-pay | Admitting: Radiology

## 2013-10-16 ENCOUNTER — Encounter: Payer: Self-pay | Admitting: Family Medicine

## 2013-10-16 ENCOUNTER — Ambulatory Visit (INDEPENDENT_AMBULATORY_CARE_PROVIDER_SITE_OTHER): Payer: 59 | Admitting: Family Medicine

## 2013-10-16 ENCOUNTER — Ambulatory Visit (INDEPENDENT_AMBULATORY_CARE_PROVIDER_SITE_OTHER)
Admission: RE | Admit: 2013-10-16 | Discharge: 2013-10-16 | Disposition: A | Discharge status: Home / Self Care | Payer: 59 | Source: Ambulatory Visit | Attending: Family Medicine | Admitting: Family Medicine

## 2013-10-16 ENCOUNTER — Ambulatory Visit
Admission: RE | Admit: 2013-10-16 | Discharge: 2013-10-16 | Disposition: A | Discharge status: Home / Self Care | Payer: 59 | Source: Ambulatory Visit | Attending: Family Medicine | Admitting: Family Medicine

## 2013-10-16 VITALS — BP 130/70 | HR 107 | Temp 98.1°F | Ht 68.0 in | Wt 217.0 lb

## 2013-10-16 DIAGNOSIS — R209 Unspecified disturbances of skin sensation: Secondary | ICD-10-CM

## 2013-10-16 DIAGNOSIS — R200 Anesthesia of skin: Secondary | ICD-10-CM

## 2013-10-16 DIAGNOSIS — M5010 Cervical disc disorder with radiculopathy, unspecified cervical region: Secondary | ICD-10-CM

## 2013-10-16 DIAGNOSIS — M545 Low back pain, unspecified: Secondary | ICD-10-CM

## 2013-10-16 DIAGNOSIS — F411 Generalized anxiety disorder: Secondary | ICD-10-CM

## 2013-10-16 DIAGNOSIS — F1010 Alcohol abuse, uncomplicated: Secondary | ICD-10-CM

## 2013-10-16 DIAGNOSIS — R5381 Other malaise: Secondary | ICD-10-CM

## 2013-10-16 DIAGNOSIS — R55 Syncope and collapse: Secondary | ICD-10-CM

## 2013-10-16 DIAGNOSIS — E785 Hyperlipidemia, unspecified: Secondary | ICD-10-CM

## 2013-10-16 DIAGNOSIS — R5383 Other fatigue: Secondary | ICD-10-CM

## 2013-10-16 DIAGNOSIS — M5412 Radiculopathy, cervical region: Secondary | ICD-10-CM

## 2013-10-16 DIAGNOSIS — F410 Panic disorder [episodic paroxysmal anxiety] without agoraphobia: Secondary | ICD-10-CM

## 2013-10-16 DIAGNOSIS — Z79899 Other long term (current) drug therapy: Secondary | ICD-10-CM

## 2013-10-16 DIAGNOSIS — I10 Essential (primary) hypertension: Secondary | ICD-10-CM

## 2013-10-16 MED ORDER — SERTRALINE HCL 50 MG PO TABS: ORAL_TABLET | ORAL | Status: DC

## 2013-10-16 MED ORDER — AMLODIPINE BESYLATE 5 MG PO TABS: 5.0000 mg | ORAL_TABLET | Freq: Every day | ORAL | Status: DC

## 2013-10-16 MED ORDER — CLONAZEPAM 0.5 MG PO TABS: 0.5000 mg | ORAL_TABLET | Freq: Two times a day (BID) | ORAL | Status: DC

## 2013-10-16 MED ORDER — SERTRALINE HCL 50 MG PO TABS: 150.0000 mg | ORAL_TABLET | Freq: Every day | ORAL | Status: DC

## 2013-10-16 NOTE — Progress Notes (Signed)
Date:  10/16/2013   Name:  Oscar Ruiz   DOB:  Apr 14, 1976   MRN:  [id number] Gender: male Age: 37 y.o.  Primary Physician:  Kerby Nora, MD   Chief Complaint: Back & Neck Pain, Numbness and Near Syncope   Subjective:   History of Present Illness:  Oscar Ruiz is a 37 y.o. pleasant patient who presents with the following:  This is a patient of Dr. Ermalene Searing, who I saw once years ago who presents today with a multitude of complaints.  Neck and back tingling. He reports having a history of chronic neck and back pain for years, approaching a decade. He reports that he had an MRI of the cervical spine in 2006 or 2007 that showed 3 herniated discs. That is not available for my review. He states that he is having some numbness and tingling bilaterally and his arms that has been worsening progressively over the last few months.  Anxiety. The patient is taking Zoloft 100 mg daily and he also is taking approximately 0.25 to less than 0.5 mg of Klonopin daily. He has been markedly more anxious and having panic attacks routinely. He does report compliance with his medication. His wife is with him today, and he is compliant. From a comorbid standpoint, the patient also drinks a large volume of alcohol nightly. He states that he drinks approximately 8 beers a night, and sometimes he will drink more than this to up to a 12 pack.  Last week was driving and had a lot of pressure on the head, jaw, and yes felt very dry. Left arm, in the right and left - felt fairly bad. Thought BP might have been up. He denies any other neurological symptoms except for those listed above. He denies blurred vision, slurred speech. No staggering or difficulty walking. No trouble with memory.  HTN: the patient's currently on Catapres orally b.i.d. For his blood pressure. He feels like he may be getting some fluctuation in his blood pressure during the day. clonodine bid. Worried that BP wil lgo up during the day.  Right now it is 130. He has been on Catapres for a number of years.  3 bulging disks. MR of your neck in 2005. Has been getting worse.   Has had a lot more anxiety recently. Feels like medicine is working not as well. Sertraline 100 mg Klonopin 0.5 mg, 3/4 tablet BID. Alcohol 8-14.  Just started lipitor. He has not had a repeat on his lipid panel are as LFTs since starting this.    Patient Active Problem List   Diagnosis Date Noted  . 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/21/2013  . Generalized anxiety disorder 04/27/2013  . GOUTY TOPHI OF OTHER SITES 01/06/2011  . GOUT, UNSPECIFIED 01/06/2011  . HYPERLIPIDEMIA 05/07/2009  . PREDIABETES 05/07/2009  . LIVER FUNCTION TESTS, ABNORMAL 05/31/2008  . PANIC ATTACK 05/20/2008  . ALCOHOL ABUSE 05/20/2008  . TOBACCO ABUSE 05/20/2008  . HYPERTENSION 05/20/2008  . LOW BACK PAIN, CHRONIC 01/02/2008    Past Medical History  Diagnosis Date  . Hypertension   . Seizures   . Substance abuse     tobacco and alcohol  . Anxiety     Past Surgical History  Procedure Laterality Date  . Hernia repair      History   Social History  . Marital Status: Married    Spouse Name: N/A    Number of Children: 2  . Years of Education: N/A  Occupational History  . plumbing    Social History Main Topics  . Smoking status: Current Every Day Smoker -- 2.00 packs/day  . Smokeless tobacco: Former Neurosurgeon  . Alcohol Use: Yes  . Drug Use: No  . Sexual Activity: Not on file   Other Topics Concern  . Not on file   Social History Narrative   Regular exercise-yes   Diet: Fast food, skips meals    Family History  Problem Relation Age of Onset  . Diabetes Mother   . Alcohol abuse Father   . Hyperlipidemia Father   . Coronary artery disease Father   . Heart failure Father     No Known Allergies  Medication list has been reviewed and updated.  Review of Systems:   GEN: No acute illnesses, no fevers,  chills. GI: No n/v/d, eating normally Pulm: No SOB Interactive and getting along well at home. Multiple other complaints are detailed above. No chest pain or shortness of breath. No other neurological complaints aside from those listed above. Anxiety without significant depression. Panic attacks. Eating and drinking normally. Otherwise, the pertinent positives and negatives are listed above and in the HPI, otherwise a full review of systems has been reviewed and is negative unless noted positive.   Objective:   Physical Examination: BP 130/70  Pulse 107  Temp(Src) 98.1 F (36.7 C) (Oral)  Ht 5\' 8"  (1.727 m)  Wt 217 lb (98.431 kg)  BMI 33.00 kg/m2  Ideal Body Weight: Weight in (lb) to have BMI = 25: 164.1   GEN: WDWN, NAD, Non-toxic, A & O x 3 HEENT: Atraumatic, Normocephalic. Neck supple. No masses, No LAD. Ears and Nose: No external deformity. CV: RRR, No M/G/R. No JVD. No thrill. No extra heart sounds. PULM: CTA B, no wheezes, crackles, rhonchi. No retractions. No resp. distress. No accessory muscle use. Neurological: CN 2-12 grossly intact. PERRLA. EOMI. Rhomberg neg. Heel shin neg, Finger nose neg. Sensation throughout to light touch, somewhat decreased on B UE not following one nerve root.  DTR 2+ throughout Str 5/5 UE and LE No clonus A and O x 4, pleasant, conversant. Cervical spine: there is mild loss of motion in multiple directions. There is some inducible radiculopathy with Spurling's maneuver. From a neurovascular standpoint, pulses are intact. Strength is grossly intact. There is some tingling and altered sensation from a sensory standpoint in his upper extremities. It does not follow a pattern that would correspond to one level. EXTR: No c/c/e PSYCH: Normally interactive. Conversant. Moderately anxious and animated.  Laboratory and Imaging Data: Dg Cervical Spine Complete  10/16/2013   CLINICAL DATA:  One week history of cervical radiculopathy.  EXAM:  CERVICAL SPINE  4+ VIEWS  COMPARISON:  None.  FINDINGS: There is no evidence of prevertebral soft tissue swelling. On the lateral image there is reversal of the normal cervical lordosis which may be associated with muscle spasm. There is narrowing of the intervertebral disc space at the level of C5-C6. Marginal osteophyte formation is seen representing degenerative spondylosis. No fracture, bony destruction, or dislocation is evident. No significant subluxation is seen. There is slight foraminal encroachment by uncovertebral spurring at the level of C5-C6 on the left. No cervical ribs are evident.  IMPRESSION: On the lateral image there is reversal of the normal cervical lordosis which may be associated with muscle spasm. There is narrowing of the intervertebral disc space at the level of C5-C6. Changes of degenerative spondylosis.   Electronically Signed   By: Onalee Hua  Call  M.D.   On: 10/16/2013 14:33    Assessment & Plan:    Near syncope - Plan: EKG 12-Lead  Cervical disc disorder with radiculopathy of cervical region - Plan: MR Cervical Spine Wo Contrast, DG Cervical Spine Complete: patient with worsening cervical radiculopathy and cervical spine pain over years with significantly worsening neurological symptoms over the last few months. Prior MRI around 2005-2007 with per the patient's report 3 herniated cervical discs. Clinically, it appears as if he is having at least nerve root encroachment. Obtain a cervical spine MRI without contrast to evaluate for potential spinal cord impingement, foraminal encroachment by bone or herniated disc. Disposition depends on MRI.  Numbness and tingling of right arm - Plan: MR Cervical Spine Wo Contrast, DG Cervical Spine Complete  Encounter for long-term (current) use of other medications - Plan: Basic metabolic panel, CBC with Differential, Hepatic function panel  Other malaise and fatigue - Plan: TSH  Other and unspecified hyperlipidemia - Plan: Lipid  panel  PANIC ATTACK: the patient is doing poorly from an anxiety and panic attacks standpoint. I am going to increase his Zoloft 150 mg p.o. Daily. I am also going to increase his Klonopin 0.5 mg p.o. B.i.d. The patient and his wife recognized that his alcohol abuse contribute, and he will not take Klonopin at nighttime when he is drinking any significant amount of alcohol.  Generalized anxiety disorder  ALCOHOL ABUSE: the patient is precontemplative and not ready to discontinue his alcohol abuse. He understands that he is an alcoholic. I offered to help him find resources and rehabilitation if he ever feels like he is ready to stop.  LOW BACK PAIN, CHRONIC: he also has some chronic back pain. I could not fully evaluate this today, and he brought this up after talking him for close to one hour. This can be fully addressed and evaluated in the future.  HYPERTENSION: I suspected that his fluctuation in blood pressure from oral clonidine twice daily dosing may be contributing to fluctuation. I suspect that this may have contributed to some of his lightheadedness and presyncope. Discontinue clonidine and start Norvasc 5 mg.  EKG: Normal sinus rhythm. Normal axis, normal R wave progression, No acute ST elevation or depression. R1 - R prime, but otherwise normal.  >40 minutes spent in face to face time with patient, >50% spent in counselling or coordination of care: approx 55 mins spent. Multiple changes made to the patient's medical and psychiatric medications. Details of discussion above. I have asked him to followup in one month with his primary care provider, Dr. Ermalene Searing.  Orders Today:  Orders Placed This Encounter  Procedures  . MR Cervical Spine Wo Contrast  . DG Cervical Spine Complete  . Basic metabolic panel  . CBC with Differential  . Hepatic function panel  . TSH  . Lipid panel  . EKG 12-Lead    New medications, updates to list, dose adjustments: Meds ordered this encounter   Medications  . amLODipine (NORVASC) 5 MG tablet    Sig: Take 1 tablet (5 mg total) by mouth daily.    Dispense:  30 tablet    Refill:  5  . DISCONTD: sertraline (ZOLOFT) 50 MG tablet    Sig: TAKE 1 TABLET BY MOUTH DAILY    Dispense:  90 tablet    Refill:  3  . clonazePAM (KLONOPIN) 0.5 MG tablet    Sig: Take 1 tablet (0.5 mg total) by mouth 2 (two) times daily.    Dispense:  60  tablet    Refill:  0  . sertraline (ZOLOFT) 50 MG tablet    Sig: Take 3 tablets (150 mg total) by mouth daily. TAKE 1 TABLET BY MOUTH DAILY    Dispense:  90 tablet    Refill:  3    Correction to prior script    Signed,  Arrin Pintor T. Aleah Ahlgrim, MD, CAQ Sports Medicine  [facility] [address] [place] [address] Phone: [phone] Fax: [phone]  Updated Complete Medication List:   Medication List       This list is accurate as of: 10/16/13 11:59 PM.  Always use your most recent med list.               amLODipine 5 MG tablet  Commonly known as:  NORVASC  Take 1 tablet (5 mg total) by mouth daily.     atorvastatin 40 MG tablet  Commonly known as:  LIPITOR  Take 1 tablet (40 mg total) by mouth daily.     clonazePAM 0.5 MG tablet  Commonly known as:  KLONOPIN  Take 1 tablet (0.5 mg total) by mouth 2 (two) times daily.     sertraline 50 MG tablet  Commonly known as:  ZOLOFT  Take 3 tablets (150 mg total) by mouth daily. TAKE 1 TABLET BY MOUTH DAILY

## 2013-10-16 NOTE — Progress Notes (Signed)
Pre-visit discussion using our clinic review tool. No additional management support is needed unless otherwise documented below in the visit note.  

## 2013-10-21 DIAGNOSIS — M5010 Cervical disc disorder with radiculopathy, unspecified cervical region: Secondary | ICD-10-CM | POA: Insufficient documentation

## 2013-10-21 MED ORDER — SERTRALINE HCL 50 MG PO TABS: 150.0000 mg | ORAL_TABLET | Freq: Every day | ORAL | Status: DC

## 2013-10-24 ENCOUNTER — Other Ambulatory Visit (INDEPENDENT_AMBULATORY_CARE_PROVIDER_SITE_OTHER): Payer: 59

## 2013-10-24 DIAGNOSIS — R7309 Other abnormal glucose: Secondary | ICD-10-CM

## 2013-10-24 DIAGNOSIS — E7800 Pure hypercholesterolemia, unspecified: Secondary | ICD-10-CM

## 2013-10-24 DIAGNOSIS — R7303 Prediabetes: Secondary | ICD-10-CM

## 2013-10-24 LAB — LIPID PANEL
Cholesterol: 145 mg/dL (ref 0–200)
HDL: 33.8 mg/dL — ABNORMAL LOW (ref >39.00)
Triglycerides: 232 mg/dL — ABNORMAL HIGH (ref 0.0–149.0)
VLDL: 46.4 mg/dL — ABNORMAL HIGH (ref 0.0–40.0)

## 2013-10-24 LAB — COMPREHENSIVE METABOLIC PANEL
AST: 40 U/L — ABNORMAL HIGH (ref 0–37)
Alkaline Phosphatase: 53 U/L (ref 39–117)
BUN: 13 mg/dL (ref 6–23)
CO2: 26 mEq/L (ref 19–32)
Calcium: 9.3 mg/dL (ref 8.4–10.5)
Creatinine, Ser: 1.2 mg/dL (ref 0.4–1.5)
GFR: 73.92 mL/min (ref >60.00)
Glucose, Bld: 115 mg/dL — ABNORMAL HIGH (ref 70–99)
Sodium: 138 mEq/L (ref 135–145)
Total Bilirubin: 0.6 mg/dL (ref 0.3–1.2)
Total Protein: 6.7 g/dL (ref 6.0–8.3)

## 2013-10-24 LAB — LDL CHOLESTEROL, DIRECT: Direct LDL: 77.1 mg/dL

## 2013-10-25 ENCOUNTER — Other Ambulatory Visit: Payer: 59

## 2013-10-29 ENCOUNTER — Other Ambulatory Visit: Payer: 59

## 2013-11-07 ENCOUNTER — Other Ambulatory Visit: Payer: Self-pay | Admitting: Family Medicine

## 2013-11-07 ENCOUNTER — Ambulatory Visit
Admission: RE | Admit: 2013-11-07 | Discharge: 2013-11-07 | Disposition: A | Discharge status: Home / Self Care | Payer: 59 | Source: Ambulatory Visit | Attending: Family Medicine | Admitting: Family Medicine

## 2013-11-07 DIAGNOSIS — M5010 Cervical disc disorder with radiculopathy, unspecified cervical region: Secondary | ICD-10-CM

## 2013-11-07 DIAGNOSIS — R200 Anesthesia of skin: Secondary | ICD-10-CM

## 2013-11-07 DIAGNOSIS — Z77018 Contact with and (suspected) exposure to other hazardous metals: Secondary | ICD-10-CM

## 2013-11-08 ENCOUNTER — Other Ambulatory Visit: Payer: Self-pay | Admitting: Family Medicine

## 2013-11-08 DIAGNOSIS — M5020 Other cervical disc displacement, unspecified cervical region: Secondary | ICD-10-CM

## 2013-11-08 DIAGNOSIS — R200 Anesthesia of skin: Secondary | ICD-10-CM

## 2013-11-09 ENCOUNTER — Encounter: Payer: Self-pay | Admitting: Family Medicine

## 2013-11-18 ENCOUNTER — Emergency Department (HOSPITAL_COMMUNITY): Payer: 59

## 2013-11-18 ENCOUNTER — Other Ambulatory Visit: Payer: Self-pay

## 2013-11-18 ENCOUNTER — Emergency Department (HOSPITAL_COMMUNITY)
Admission: EM | Admit: 2013-11-18 | Discharge: 2013-11-18 | Disposition: A | Discharge status: Home / Self Care | Payer: 59 | Attending: Emergency Medicine | Admitting: Emergency Medicine

## 2013-11-18 ENCOUNTER — Encounter (HOSPITAL_COMMUNITY): Payer: Self-pay | Admitting: Emergency Medicine

## 2013-11-18 DIAGNOSIS — R002 Palpitations: Secondary | ICD-10-CM | POA: Insufficient documentation

## 2013-11-18 DIAGNOSIS — R0602 Shortness of breath: Secondary | ICD-10-CM | POA: Insufficient documentation

## 2013-11-18 DIAGNOSIS — F17200 Nicotine dependence, unspecified, uncomplicated: Secondary | ICD-10-CM | POA: Insufficient documentation

## 2013-11-18 DIAGNOSIS — Z79899 Other long term (current) drug therapy: Secondary | ICD-10-CM | POA: Insufficient documentation

## 2013-11-18 DIAGNOSIS — F411 Generalized anxiety disorder: Secondary | ICD-10-CM | POA: Insufficient documentation

## 2013-11-18 DIAGNOSIS — R072 Precordial pain: Secondary | ICD-10-CM | POA: Insufficient documentation

## 2013-11-18 DIAGNOSIS — R209 Unspecified disturbances of skin sensation: Secondary | ICD-10-CM | POA: Insufficient documentation

## 2013-11-18 DIAGNOSIS — G40909 Epilepsy, unspecified, not intractable, without status epilepticus: Secondary | ICD-10-CM | POA: Insufficient documentation

## 2013-11-18 DIAGNOSIS — I10 Essential (primary) hypertension: Secondary | ICD-10-CM | POA: Insufficient documentation

## 2013-11-18 DIAGNOSIS — F1010 Alcohol abuse, uncomplicated: Secondary | ICD-10-CM | POA: Insufficient documentation

## 2013-11-18 DIAGNOSIS — R079 Chest pain, unspecified: Secondary | ICD-10-CM

## 2013-11-18 LAB — CBC WITH DIFFERENTIAL/PLATELET
Basophils Absolute: 0 10*3/uL (ref 0.0–0.1)
Basophils Relative: 0 % (ref 0–1)
Eosinophils Relative: 0 % (ref 0–5)
HCT: 46.6 % (ref 39.0–52.0)
MCHC: 36.5 g/dL — ABNORMAL HIGH (ref 30.0–36.0)
MCV: 86.3 fL (ref 78.0–100.0)
Monocytes Absolute: 0.7 10*3/uL (ref 0.1–1.0)
Neutro Abs: 9 10*3/uL — ABNORMAL HIGH (ref 1.7–7.7)
Platelets: 156 10*3/uL (ref 150–400)
RBC: 5.4 MIL/uL (ref 4.22–5.81)
RDW: 12.3 % (ref 11.5–15.5)
WBC: 11.4 10*3/uL — ABNORMAL HIGH (ref 4.0–10.5)

## 2013-11-18 LAB — POCT I-STAT TROPONIN I: Troponin i, poc: 0 ng/mL (ref 0.00–0.08)

## 2013-11-18 LAB — BASIC METABOLIC PANEL
Calcium: 9 mg/dL (ref 8.4–10.5)
Chloride: 97 mEq/L (ref 96–112)
Creatinine, Ser: 1.18 mg/dL (ref 0.50–1.35)
GFR calc Af Amer: >90 mL/min (ref >90)
Sodium: 135 mEq/L (ref 135–145)

## 2013-11-18 MED ORDER — NITROGLYCERIN 0.4 MG SL SUBL
0.4000 mg | SUBLINGUAL_TABLET | SUBLINGUAL | Status: DC | PRN
  Administered 2013-11-18: 0.4 mg via SUBLINGUAL

## 2013-11-18 NOTE — ED Provider Notes (Signed)
CSN: [id number]     Arrival date & time 11/18/13  1809 History   First MD Initiated Contact with Patient 11/18/13 1819     Chief Complaint  Patient presents with  . Chest Pain  . Alcohol Intoxication    Patient is a 37 y.o. male presenting with chest pain.  Chest Pain Associated symptoms: palpitations and shortness of breath    37 y/o male with history of HTN and ETOH abuse who presents with cc of chest pain. Symptoms began this afternoon while watching television. He states he had one can of beer this morning. He has had associated chest palpitations between yesterday and today. He states that his palpitations were worse yesterday but when had the episode of chest pain he got scared. He states his pain was substernal. He described it as "tight", non-radiating and a 5/10 when it was at it's worst. He had associated shortness of breath. Both chest pain and shortness of breath have significantly improved since calling EMS. He states that today he has had associated "tingling" in the toes of his right foot.   Past Medical History  Diagnosis Date  . Hypertension   . Seizures   . Substance abuse     tobacco and alcohol  . Anxiety    Past Surgical History  Procedure Laterality Date  . Hernia repair     Family History  Problem Relation Age of Onset  . Diabetes Mother   . Alcohol abuse Father   . Hyperlipidemia Father   . Coronary artery disease Father   . Heart failure Father    History  Substance Use Topics  . Smoking status: Current Every Day Smoker -- 2.00 packs/day  . Smokeless tobacco: Former Neurosurgeon  . Alcohol Use: Yes    Review of Systems  Respiratory: Positive for shortness of breath.   Cardiovascular: Positive for chest pain and palpitations. Negative for leg swelling.  All other systems reviewed and are negative.    Allergies  Review of patient's allergies indicates no known allergies.  Home Medications   Current Outpatient Rx  Name  Route  Sig  Dispense  Refill   . amLODipine (NORVASC) 5 MG tablet   Oral   Take 1 tablet (5 mg total) by mouth daily.   30 tablet   5   . atorvastatin (LIPITOR) 40 MG tablet   Oral   Take 1 tablet (40 mg total) by mouth daily.   90 tablet   3   . clonazePAM (KLONOPIN) 0.5 MG tablet   Oral   Take 1 tablet (0.5 mg total) by mouth 2 (two) times daily.   60 tablet   0   . sertraline (ZOLOFT) 50 MG tablet   Oral   Take 3 tablets (150 mg total) by mouth daily.   90 tablet   3     Correction to prior script    BP 147/89  Pulse 102  Resp 21  SpO2 97% Physical Exam  Constitutional: He is oriented to person, place, and time. He appears well-developed and well-nourished. No distress.  HENT:  Head: Normocephalic and atraumatic.  Mouth/Throat: No oropharyngeal exudate.  Eyes: Conjunctivae are normal. Pupils are equal, round, and reactive to light.  Neck: Normal range of motion. Neck supple.  Cardiovascular: Normal rate and normal heart sounds.  Exam reveals no gallop and no friction rub.   No murmur heard. Pulses:      Dorsalis pedis pulses are 2+ on the right side, and 2+ on the  left side.       Posterior tibial pulses are 2+ on the right side, and 2+ on the left side.  +2 symmetric radial pulses bilaterally  Pulmonary/Chest: Effort normal and breath sounds normal.  Abdominal: Soft. He exhibits no distension. There is no tenderness.  Musculoskeletal: Normal range of motion. He exhibits no edema and no tenderness.  Neurological: He is alert and oriented to person, place, and time. He has normal strength and normal reflexes. No cranial nerve deficit or sensory deficit. Coordination normal. GCS eye subscore is 4. GCS verbal subscore is 5. GCS motor subscore is 6.  Skin: Skin is warm and dry.    ED Course  Procedures (including critical care time) Labs Review Labs Reviewed  CBC WITH DIFFERENTIAL - Abnormal; Notable for the following:    WBC 11.4 (*)    MCHC 36.5 (*)    Neutrophils Relative % 79 (*)     Neutro Abs 9.0 (*)    All other components within normal limits  BASIC METABOLIC PANEL - Abnormal; Notable for the following:    Glucose, Bld 105 (*)    GFR calc non Af Amer 78 (*)    All other components within normal limits  POCT I-STAT TROPONIN I  POCT I-STAT TROPONIN I   Imaging Review Dg Chest 2 View  11/18/2013   CLINICAL DATA:  Chest pain.  Alcohol intoxication  EXAM: CHEST  2 VIEW  COMPARISON:  None.  FINDINGS: The heart size and mediastinal contours are within normal limits. Both lungs are clear. The visualized skeletal structures are unremarkable.  IMPRESSION: No active cardiopulmonary disease.   Electronically Signed   By: Marlan Palau M.D.   On: 11/18/2013 19:56    EKG Interpretation    Date/Time:  Sunday November 18 2013 18:15:23 EST Ventricular Rate:  113 PR Interval:  157 QRS Duration: 90 QT Interval:  319 QTC Calculation: 437 R Axis:   68 Text Interpretation:  Sinus tachycardia Baseline wander in lead(s) I II aVR Non-specific ST-t changes Confirmed by Bebe Shaggy  MD, DONALD [phone]) on 11/18/2013 6:21:57 PM Also confirmed by Bebe Shaggy  MD, DONALD [phone]), editor Colton, Dois Davenport [phone])  on 11/19/2013 7:15:43 AM           MDM   37 y/o male with history of HTN and ETOH abuse who presents with cc of chest pain. Afebrile. VSS other than mild tachycardia. Doubt active withdrawal given he had ETOH this AM. Clinically sober at this time. EKG without acute ischmic changes. Delta troponin negative. Low risk for ACS and symptoms are atypical. CXR not c/w dissection, pneumonia or pneumothorax. Doubt PE as symptoms are atypical and has no significant risk factors. Likely Gastritis. The patient was discharged home with instructions to follow up with a pcp. Return precautions given and discussed with the patient who was in agreement with the plan.    1. Chest pain        Shanon Ace, MD 11/19/13 [phone]

## 2013-11-18 NOTE — ED Notes (Signed)
Pt arrived to ED via [facility] . Pt called EMS because of CP and withdrawal SX'S. Pt reports he drinks 16- 12oz cans of beer every day for 17years. Pt reports he felt bad yesterday and drank much less . Pt now t

## 2013-11-20 NOTE — ED Provider Notes (Signed)
I have personally seen and examined the patient.  I have discussed the plan of care with the resident.  I have reviewed the documentation on PMH/FH/Soc. History.  I have reviewed the documentation of the resident and agree.  I have reviewed and agree with the ECG interpretation(s) documented by the resident.  Pt well appearing and in no distress I spoke to patient and significant other several times and discussed plan of care His repeat ekg/troponin were unremarkable I doubt ACS/Pe/Dissection We discussed strict return precautions Pt agreeable with plan  Joya Gaskins, MD 11/20/13 0630

## 2013-11-29 ENCOUNTER — Other Ambulatory Visit: Payer: Self-pay | Admitting: Family Medicine

## 2013-11-29 NOTE — Telephone Encounter (Signed)
Last office visit 10/16/2013 with Dr. Patsy Lageropland.  Ok to refill?

## 2013-11-29 NOTE — Telephone Encounter (Signed)
Pt left v/m; pt asked [facility] to request refill clonazepam on 11/28/13 and pt has one pill left. Pt request refilled today.Please advise.pt request cb when called in.

## 2013-11-29 NOTE — Telephone Encounter (Signed)
Called to [facility] [address] Shareef notified prescription has been called to his pharmacy.  Also reminded him of his appointment tomorrow at 8:30am with Dr. Ermalene SearingBedsole.

## 2013-11-29 NOTE — Telephone Encounter (Signed)
In Dr. Senaida LangeB's absence, ok to refill 30, 0 ref

## 2013-11-30 ENCOUNTER — Ambulatory Visit (INDEPENDENT_AMBULATORY_CARE_PROVIDER_SITE_OTHER): Payer: 59 | Admitting: Family Medicine

## 2013-11-30 ENCOUNTER — Encounter: Payer: Self-pay | Admitting: Family Medicine

## 2013-11-30 VITALS — BP 150/90 | HR 109 | Temp 98.2°F | Ht 68.0 in | Wt 211.2 lb

## 2013-11-30 DIAGNOSIS — E785 Hyperlipidemia, unspecified: Secondary | ICD-10-CM

## 2013-11-30 DIAGNOSIS — F1010 Alcohol abuse, uncomplicated: Secondary | ICD-10-CM

## 2013-11-30 DIAGNOSIS — I10 Essential (primary) hypertension: Secondary | ICD-10-CM

## 2013-11-30 DIAGNOSIS — F410 Panic disorder [episodic paroxysmal anxiety] without agoraphobia: Secondary | ICD-10-CM

## 2013-11-30 DIAGNOSIS — R7309 Other abnormal glucose: Secondary | ICD-10-CM

## 2013-11-30 NOTE — Assessment & Plan Note (Signed)
Poor control

## 2013-11-30 NOTE — Assessment & Plan Note (Signed)
Improved control on lipitor. LDL at goal. Trigs still elevated.

## 2013-11-30 NOTE — Patient Instructions (Addendum)
Increase norvasc  to 10 mg daily. Call if swelling in legs bothersome as side effect. Goal BP < 140/90. Call if BPs remain elevated after several days on higher dose. Decrease sertraline back to 100 mg daily.   Continue clonazepam at current dose. Follow up in 1 week for BP check. I will call you with referral for alcohol dependance later today hopefully.

## 2013-11-30 NOTE — Assessment & Plan Note (Signed)
Poor control... Increase norvasc to 10 mg daily. Follow BP at home. Call if greater than or equal to 140/90

## 2013-11-30 NOTE — Assessment & Plan Note (Signed)
Poor control, continue clonazepam.  Wean off sertraline back to 100 mg to start.  needs referral to psychiatry.

## 2013-11-30 NOTE — Assessment & Plan Note (Addendum)
He needs formal outpt program tpo assist in stopping alcohol use.  I have contacted behavoiral health and will let pt know next step with they return my call.  Discussed pt case in detail with Dewayne HatchAnn at [facility].. She stated the only safe way to go about this is hospital admission [facility] detox [facility]. I discussed with the pt and his  wife and he is willing to be admitted for detox. He will consider when to do this, likely over the next few days. [facility]. He will continue alcohol and current meds until then.

## 2013-11-30 NOTE — Progress Notes (Signed)
Pre-visit discussion using our clinic review tool. No additional management support is needed unless otherwise documented below in the visit note.  

## 2013-11-30 NOTE — Progress Notes (Signed)
Subjective:    Patient ID: Oscar Ruiz, male    DOB: Aug 03, 1976, 38 y.o.   MRN: [id number]  HPI 38 year old male presents for follow up after an appt with Dr. Patsy Lageropland in early Nov. As well as ER visit on 12/28 for the following:   Neck and back tingling. He reports having a history of chronic neck and back pain for years, approaching a decade. He reports that he had an MRI of the cervical spine in 2006 or 2007 that showed 3 herniated discs. That is not available for my review. He states that he is having some numbness and tingling bilaterally and his arms that has been worsening progressively over the last few months.  3 bulging disks. MR of neck in 2005. Has been getting worse.   Sent for mri neck 11/28: IMPRESSION:  1. 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  2. Soft disc protrusion paracentral and to the left at C6-7.  Then referred to neurosurgery.. Dr. Gerlene FeeKritzer. They performed a thoracic MRI results are pending.. This eval was for increased reflexes in lower extremities. Plans surgery in crevical spine.  Anxiety. The patient is taking Zoloft 100 mg daily and he also is taking approximately 0.25 to less than 0.5 mg of Klonopin daily. He has been markedly more anxious and having panic attacks routinely. He does report compliance with his medication. His wife is with him today, and he is compliant. From a comorbid standpoint, the patient also drinks a large volume of alcohol nightly. He states that he drinks approximately 8 beers a night, and sometimes he will drink more than this to up to a 12 pack.   Dr Patsy Lageropland increased sertraline to 150 mg daily and increased dosing of klonopin.  ? presyncopal event: eval'd with labs and EKG was normal.  HTN: the patient's currently on Catapres orally b.i.d. For his blood pressure. He feels like he may be getting some fluctuation in his blood pressure during the day. clonodine bid. Worried that BP wil lgo up during the day.  Right now it is 130. He has been on Catapres for a number of years.   Dr. Patsy Lageropland stopped catapres and changed to amlodipine 5 mg daily. BP Readings from Last 3 Encounters:  11/30/13 150/90  11/18/13 147/89  10/16/13 130/70    He was also was seen in ED on 12/28 for chest pain ( He describes as flashes of dizziness, facial pressure, flushing presyncope , SOB and chest pain)   BP 190/?, HR 120  neg cxr, neg EKG, nml labs and troponin.  Thought likely gastritits vs DTs   Hyperlipidemia:  LDL much improved now on 40 mg daily of lipitor. Trigs still increased. Lab Results  Component Value Date   CHOL 145 10/24/2013   HDL 33.80* 10/24/2013   LDLDIRECT 77.1 10/24/2013   TRIG 232.0* 10/24/2013   CHOLHDL 4 10/24/2013    He report he has continued to have  episodes of palpitations, face pressure, tingling in head occur daily. His wife reports his symptoms have gotten much worse in last 6 weeks since increase in sertraline ( cannot got to work) he is more emotional, a big ball of nerves. No chest pain, shortness of breath. He took clonazepam and lay down... Symptoms went away.   He is still drinking 15 beers a day. Self medicating with alcohol.  He is interested in quitting alcohol in a controlled fashion.  He states he only feels well hen he  is drinking or when he is asleep.      Review of Systems  Constitutional: Positive for fatigue. Negative for fever.  HENT: Negative for ear pain.   Eyes: Negative for pain.  Respiratory: Negative for shortness of breath.   Cardiovascular: Positive for palpitations. Negative for chest pain and leg swelling.  Gastrointestinal: Negative for abdominal pain.  Endocrine: Negative for polydipsia.       Objective:   Physical Exam  Constitutional: Vital signs are normal. He appears well-developed and well-nourished.  HENT:  Head: Normocephalic.  Right Ear: Hearing normal.  Left Ear: Hearing normal.  Nose: Nose normal.  Mouth/Throat: Oropharynx  is clear and moist and mucous membranes are normal.  Neck: Trachea normal. Carotid bruit is not present. No mass and no thyromegaly present.  Cardiovascular: Normal rate, regular rhythm and normal pulses.  Exam reveals no gallop, no distant heart sounds and no friction rub.   No murmur heard. No peripheral edema  Pulmonary/Chest: Effort normal and breath sounds normal. No respiratory distress.  Skin: Skin is warm, dry and intact. No rash noted.  Psychiatric: His speech is normal and behavior is normal. Judgment and thought content normal. His mood appears anxious. He is not agitated. Cognition and memory are normal. He does not exhibit a depressed mood. He expresses no homicidal and no suicidal ideation. He expresses no suicidal plans and no homicidal plans.          Assessment & Plan:

## 2013-12-02 ENCOUNTER — Emergency Department (HOSPITAL_COMMUNITY)
Admission: EM | Admit: 2013-12-02 | Discharge: 2013-12-02 | Disposition: A | Discharge status: Other Acute Inpatient Hospital | Payer: 59 | Attending: Emergency Medicine | Admitting: Emergency Medicine

## 2013-12-02 ENCOUNTER — Encounter (HOSPITAL_COMMUNITY): Payer: Self-pay | Admitting: *Deleted

## 2013-12-02 ENCOUNTER — Inpatient Hospital Stay (HOSPITAL_COMMUNITY)
Admission: AD | Admit: 2013-12-02 | Discharge: 2013-12-06 | DRG: 897 | Disposition: A | Discharge status: Home / Self Care | Payer: 59 | Source: Intra-hospital | Attending: Psychiatry | Admitting: Psychiatry

## 2013-12-02 ENCOUNTER — Encounter (HOSPITAL_COMMUNITY): Payer: Self-pay | Admitting: Emergency Medicine

## 2013-12-02 DIAGNOSIS — F410 Panic disorder [episodic paroxysmal anxiety] without agoraphobia: Secondary | ICD-10-CM

## 2013-12-02 DIAGNOSIS — Z0289 Encounter for other administrative examinations: Secondary | ICD-10-CM | POA: Insufficient documentation

## 2013-12-02 DIAGNOSIS — Z9289 Personal history of other medical treatment: Secondary | ICD-10-CM | POA: Insufficient documentation

## 2013-12-02 DIAGNOSIS — F32A Depression, unspecified: Secondary | ICD-10-CM | POA: Diagnosis present

## 2013-12-02 DIAGNOSIS — F1010 Alcohol abuse, uncomplicated: Secondary | ICD-10-CM | POA: Diagnosis present

## 2013-12-02 DIAGNOSIS — F1020 Alcohol dependence, uncomplicated: Secondary | ICD-10-CM

## 2013-12-02 DIAGNOSIS — F411 Generalized anxiety disorder: Secondary | ICD-10-CM

## 2013-12-02 DIAGNOSIS — F329 Major depressive disorder, single episode, unspecified: Secondary | ICD-10-CM | POA: Diagnosis present

## 2013-12-02 DIAGNOSIS — F419 Anxiety disorder, unspecified: Secondary | ICD-10-CM

## 2013-12-02 DIAGNOSIS — Z79899 Other long term (current) drug therapy: Secondary | ICD-10-CM

## 2013-12-02 DIAGNOSIS — R259 Unspecified abnormal involuntary movements: Secondary | ICD-10-CM | POA: Insufficient documentation

## 2013-12-02 DIAGNOSIS — Z09 Encounter for follow-up examination after completed treatment for conditions other than malignant neoplasm: Secondary | ICD-10-CM

## 2013-12-02 DIAGNOSIS — F1021 Alcohol dependence, in remission: Secondary | ICD-10-CM | POA: Diagnosis present

## 2013-12-02 DIAGNOSIS — G40909 Epilepsy, unspecified, not intractable, without status epilepticus: Secondary | ICD-10-CM | POA: Insufficient documentation

## 2013-12-02 DIAGNOSIS — F17200 Nicotine dependence, unspecified, uncomplicated: Secondary | ICD-10-CM | POA: Insufficient documentation

## 2013-12-02 DIAGNOSIS — I10 Essential (primary) hypertension: Secondary | ICD-10-CM

## 2013-12-02 LAB — COMPREHENSIVE METABOLIC PANEL
ALK PHOS: 73 U/L (ref 39–117)
ALT: 67 U/L — ABNORMAL HIGH (ref 0–53)
AST: 63 U/L — ABNORMAL HIGH (ref 0–37)
Albumin: 4.4 g/dL (ref 3.5–5.2)
BILIRUBIN TOTAL: 0.4 mg/dL (ref 0.3–1.2)
BUN: 13 mg/dL (ref 6–23)
CHLORIDE: 95 meq/L — AB (ref 96–112)
CO2: 23 meq/L (ref 19–32)
Calcium: 9 mg/dL (ref 8.4–10.5)
Creatinine, Ser: 1.27 mg/dL (ref 0.50–1.35)
GFR, EST AFRICAN AMERICAN: 83 mL/min — AB (ref >90)
GFR, EST NON AFRICAN AMERICAN: 71 mL/min — AB (ref >90)
GLUCOSE: 112 mg/dL — AB (ref 70–99)
POTASSIUM: 3.9 meq/L (ref 3.7–5.3)
Sodium: 134 mEq/L — ABNORMAL LOW (ref 137–147)
Total Protein: 7.6 g/dL (ref 6.0–8.3)

## 2013-12-02 LAB — CBC
HCT: 43.2 % (ref 39.0–52.0)
HEMOGLOBIN: 15.8 g/dL (ref 13.0–17.0)
MCH: 31.2 pg (ref 26.0–34.0)
MCHC: 36.6 g/dL — ABNORMAL HIGH (ref 30.0–36.0)
MCV: 85.2 fL (ref 78.0–100.0)
PLATELETS: 172 10*3/uL (ref 150–400)
RBC: 5.07 MIL/uL (ref 4.22–5.81)
RDW: 12.3 % (ref 11.5–15.5)
WBC: 12.3 10*3/uL — AB (ref 4.0–10.5)

## 2013-12-02 LAB — RAPID URINE DRUG SCREEN, HOSP PERFORMED
Amphetamines: NOT DETECTED
Barbiturates: NOT DETECTED
Benzodiazepines: NOT DETECTED
Cocaine: NOT DETECTED
Opiates: NOT DETECTED
TETRAHYDROCANNABINOL: NOT DETECTED

## 2013-12-02 LAB — ETHANOL: Alcohol, Ethyl (B): <11 mg/dL (ref 0–11)

## 2013-12-02 MED ORDER — IBUPROFEN 200 MG PO TABS: 600.0000 mg | ORAL_TABLET | Freq: Three times a day (TID) | ORAL | Status: DC | PRN

## 2013-12-02 MED ORDER — AMLODIPINE BESYLATE 10 MG PO TABS
10.0000 mg | ORAL_TABLET | Freq: Every day | ORAL | Status: DC
  Filled 2013-12-02: qty 1

## 2013-12-02 MED ORDER — CHLORDIAZEPOXIDE HCL 25 MG PO CAPS
25.0000 mg | ORAL_CAPSULE | Freq: Three times a day (TID) | ORAL | Status: AC
  Administered 2013-12-04 (×3): 25 mg via ORAL
  Filled 2013-12-02 (×3): qty 1

## 2013-12-02 MED ORDER — IBUPROFEN 600 MG PO TABS: 600.0000 mg | ORAL_TABLET | Freq: Three times a day (TID) | ORAL | Status: DC | PRN

## 2013-12-02 MED ORDER — LOPERAMIDE HCL 2 MG PO CAPS: 2.0000 mg | ORAL_CAPSULE | ORAL | Status: DC | PRN

## 2013-12-02 MED ORDER — ZOLPIDEM TARTRATE 5 MG PO TABS: 5.0000 mg | ORAL_TABLET | Freq: Every evening | ORAL | Status: DC | PRN

## 2013-12-02 MED ORDER — THIAMINE HCL 100 MG/ML IJ SOLN: 100.0000 mg | Freq: Every day | INTRAMUSCULAR | Status: DC

## 2013-12-02 MED ORDER — CHLORDIAZEPOXIDE HCL 25 MG PO CAPS: 25.0000 mg | ORAL_CAPSULE | Freq: Four times a day (QID) | ORAL | Status: AC | PRN

## 2013-12-02 MED ORDER — ADULT MULTIVITAMIN W/MINERALS CH
1.0000 | ORAL_TABLET | Freq: Every day | ORAL | Status: DC
  Administered 2013-12-03 – 2013-12-06 (×4): 1 via ORAL
  Filled 2013-12-02 (×5): qty 1

## 2013-12-02 MED ORDER — SERTRALINE HCL 50 MG PO TABS: 150.0000 mg | ORAL_TABLET | Freq: Every day | ORAL | Status: DC

## 2013-12-02 MED ORDER — SERTRALINE HCL 50 MG PO TABS: 100.0000 mg | ORAL_TABLET | Freq: Every day | ORAL | Status: DC

## 2013-12-02 MED ORDER — SERTRALINE HCL 100 MG PO TABS
100.0000 mg | ORAL_TABLET | Freq: Every day | ORAL | Status: DC
  Administered 2013-12-02 – 2013-12-05 (×4): 100 mg via ORAL
  Filled 2013-12-02 (×6): qty 1

## 2013-12-02 MED ORDER — ATORVASTATIN CALCIUM 40 MG PO TABS
40.0000 mg | ORAL_TABLET | Freq: Every day | ORAL | Status: DC
  Administered 2013-12-02 – 2013-12-05 (×4): 40 mg via ORAL
  Filled 2013-12-02 (×6): qty 1

## 2013-12-02 MED ORDER — CHLORDIAZEPOXIDE HCL 25 MG PO CAPS
25.0000 mg | ORAL_CAPSULE | Freq: Once | ORAL | Status: DC
Start: 2013-12-02 — End: 2013-12-02
  Administered 2013-12-02: 25 mg via ORAL
  Filled 2013-12-02: qty 1

## 2013-12-02 MED ORDER — NICOTINE 21 MG/24HR TD PT24
21.0000 mg | MEDICATED_PATCH | Freq: Every day | TRANSDERMAL | Status: DC
  Administered 2013-12-03 – 2013-12-06 (×4): 21 mg via TRANSDERMAL
  Filled 2013-12-02 (×5): qty 1

## 2013-12-02 MED ORDER — CHLORDIAZEPOXIDE HCL 25 MG PO CAPS
25.0000 mg | ORAL_CAPSULE | Freq: Four times a day (QID) | ORAL | Status: DC
  Administered 2013-12-02: 25 mg via ORAL
  Filled 2013-12-02: qty 1

## 2013-12-02 MED ORDER — CHLORDIAZEPOXIDE HCL 25 MG PO CAPS
25.0000 mg | ORAL_CAPSULE | Freq: Four times a day (QID) | ORAL | Status: AC
  Administered 2013-12-02 – 2013-12-03 (×4): 25 mg via ORAL
  Filled 2013-12-02 (×4): qty 1

## 2013-12-02 MED ORDER — CHLORDIAZEPOXIDE HCL 25 MG PO CAPS
25.0000 mg | ORAL_CAPSULE | Freq: Once | ORAL | Status: AC
  Administered 2013-12-02: 25 mg via ORAL
  Filled 2013-12-02: qty 1

## 2013-12-02 MED ORDER — CHLORDIAZEPOXIDE HCL 25 MG PO CAPS: 25.0000 mg | ORAL_CAPSULE | Freq: Four times a day (QID) | ORAL | Status: DC | PRN

## 2013-12-02 MED ORDER — VITAMIN B-1 100 MG PO TABS
100.0000 mg | ORAL_TABLET | Freq: Every day | ORAL | Status: DC
  Administered 2013-12-03 – 2013-12-06 (×4): 100 mg via ORAL
  Filled 2013-12-02 (×5): qty 1

## 2013-12-02 MED ORDER — CHLORDIAZEPOXIDE HCL 25 MG PO CAPS
25.0000 mg | ORAL_CAPSULE | ORAL | Status: AC
  Administered 2013-12-05 (×2): 25 mg via ORAL
  Filled 2013-12-02 (×2): qty 1

## 2013-12-02 MED ORDER — CHLORDIAZEPOXIDE HCL 25 MG PO CAPS
25.0000 mg | ORAL_CAPSULE | Freq: Every day | ORAL | Status: AC
  Administered 2013-12-06: 25 mg via ORAL
  Filled 2013-12-02: qty 1

## 2013-12-02 MED ORDER — ONDANSETRON HCL 4 MG PO TABS: 4.0000 mg | ORAL_TABLET | Freq: Three times a day (TID) | ORAL | Status: DC | PRN

## 2013-12-02 MED ORDER — LORAZEPAM 1 MG PO TABS: 0.0000 mg | ORAL_TABLET | Freq: Two times a day (BID) | ORAL | Status: DC

## 2013-12-02 MED ORDER — ATORVASTATIN CALCIUM 40 MG PO TABS
40.0000 mg | ORAL_TABLET | Freq: Every day | ORAL | Status: DC
  Filled 2013-12-02: qty 1

## 2013-12-02 MED ORDER — CLONAZEPAM 0.5 MG PO TABS: 0.5000 mg | ORAL_TABLET | Freq: Every day | ORAL | Status: DC

## 2013-12-02 MED ORDER — NICOTINE 21 MG/24HR TD PT24
21.0000 mg | MEDICATED_PATCH | Freq: Every day | TRANSDERMAL | Status: DC
  Administered 2013-12-02: 21 mg via TRANSDERMAL
  Filled 2013-12-02: qty 1

## 2013-12-02 MED ORDER — ALUM & MAG HYDROXIDE-SIMETH 200-200-20 MG/5ML PO SUSP: 30.0000 mL | ORAL | Status: DC | PRN

## 2013-12-02 MED ORDER — CHLORDIAZEPOXIDE HCL 25 MG PO CAPS: 25.0000 mg | ORAL_CAPSULE | ORAL | Status: DC

## 2013-12-02 MED ORDER — ONDANSETRON 4 MG PO TBDP: 4.0000 mg | ORAL_TABLET | Freq: Four times a day (QID) | ORAL | Status: AC | PRN

## 2013-12-02 MED ORDER — MAGNESIUM HYDROXIDE 400 MG/5ML PO SUSP: 30.0000 mL | Freq: Every day | ORAL | Status: DC | PRN

## 2013-12-02 MED ORDER — VITAMIN B-1 100 MG PO TABS
100.0000 mg | ORAL_TABLET | Freq: Every day | ORAL | Status: DC
  Administered 2013-12-02: 100 mg via ORAL
  Filled 2013-12-02: qty 1

## 2013-12-02 MED ORDER — LOPERAMIDE HCL 2 MG PO CAPS: 2.0000 mg | ORAL_CAPSULE | ORAL | Status: AC | PRN

## 2013-12-02 MED ORDER — LORAZEPAM 1 MG PO TABS: 1.0000 mg | ORAL_TABLET | Freq: Three times a day (TID) | ORAL | Status: DC | PRN

## 2013-12-02 MED ORDER — CHLORDIAZEPOXIDE HCL 25 MG PO CAPS: 25.0000 mg | ORAL_CAPSULE | Freq: Three times a day (TID) | ORAL | Status: DC

## 2013-12-02 MED ORDER — ONDANSETRON 4 MG PO TBDP: 4.0000 mg | ORAL_TABLET | Freq: Four times a day (QID) | ORAL | Status: DC | PRN

## 2013-12-02 MED ORDER — AMLODIPINE BESYLATE 10 MG PO TABS
10.0000 mg | ORAL_TABLET | Freq: Every day | ORAL | Status: DC
  Administered 2013-12-02 – 2013-12-05 (×4): 10 mg via ORAL
  Filled 2013-12-02 (×6): qty 1

## 2013-12-02 MED ORDER — ACETAMINOPHEN 325 MG PO TABS: 650.0000 mg | ORAL_TABLET | ORAL | Status: DC | PRN

## 2013-12-02 MED ORDER — ADULT MULTIVITAMIN W/MINERALS CH
1.0000 | ORAL_TABLET | Freq: Every day | ORAL | Status: DC
  Administered 2013-12-02: 1 via ORAL
  Filled 2013-12-02: qty 1

## 2013-12-02 MED ORDER — LORAZEPAM 1 MG PO TABS: 0.0000 mg | ORAL_TABLET | Freq: Four times a day (QID) | ORAL | Status: DC

## 2013-12-02 MED ORDER — LORAZEPAM 2 MG/ML IJ SOLN: 0.0000 mg | Freq: Four times a day (QID) | INTRAMUSCULAR | Status: DC

## 2013-12-02 MED ORDER — LORAZEPAM 2 MG/ML IJ SOLN: 0.0000 mg | Freq: Two times a day (BID) | INTRAMUSCULAR | Status: DC

## 2013-12-02 MED ORDER — CHLORDIAZEPOXIDE HCL 25 MG PO CAPS: 25.0000 mg | ORAL_CAPSULE | Freq: Every day | ORAL | Status: DC

## 2013-12-02 NOTE — Tx Team (Signed)
Initial Interdisciplinary Treatment Plan  PATIENT STRENGTHS: (choose at least two) Ability for insight Average or above average intelligence Capable of independent living Supportive family/friends  PATIENT STRESSORS: Substance abuse   PROBLEM LIST: Problem List/Patient Goals Date to be addressed Date deferred Reason deferred Estimated date of resolution  ETOH abuse 12/02/13                                                      DISCHARGE CRITERIA:  Ability to meet basic life and health needs Improved stabilization in mood, thinking, and/or behavior Verbal commitment to aftercare and medication compliance Withdrawal symptoms are absent or subacute and managed without 24-hour nursing intervention  PRELIMINARY DISCHARGE PLAN: Attend aftercare/continuing care group Return to previous living arrangement  PATIENT/FAMIILY INVOLVEMENT: This treatment plan has been presented to and reviewed with the patient, Birdie Riddleimothy G Wiedel, and/or family member, .  The patient and family have been given the opportunity to ask questions and make suggestions.  Netanel Yannuzzi, [place] 12/02/2013, 7:41 PM

## 2013-12-02 NOTE — ED Notes (Signed)
Pt states that he is here for ETOH detox.  Last drink was last night at 1 am.  States that on a normal day he drinks 15 beers.  Pt is shaky but denies NVD.  Denies AV hallucinations.  Never has done detox before.  Denies SI/HI.

## 2013-12-02 NOTE — ED Notes (Signed)
TTS into see 

## 2013-12-02 NOTE — ED Notes (Signed)
Pt [facility] comfortable with [facility], wife at bedside.

## 2013-12-02 NOTE — ED Notes (Signed)
Patient has been wanded by security, patient belongings have been taken to car by family.

## 2013-12-02 NOTE — BH Assessment (Addendum)
Assessment Note  Oscar Ruiz is an 38 y.o. male. Pt presents requesting detox.  Pt reports long term history of daily alcohol use, usually 15 beers per day, more on weekends.  Pt reports he has begun to have alcohol withdrawal earlier and earlier in the day.  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  Pt reports he came to ED several weeks ago due to racing heart and was told he was in withdrawal.  Pt has since talked with PCP, who recommended he be detoxed and [facility].  Pt reports he is also having neck issues and is going to need surgery for this.  Pt denies drug use.  Pt denies any prior treatment.  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  Pt states he does not read and has not had a siezure since age 38.  Pt denies any mental health history, denies depression.  Denies SI/HI/AV.  Pt does report problems with anxiety and is on zoloft and klonapin from PCP for this for past 4 years.    Axis I: alcohol dependence Axis II: Deferred Axis III:  Past Medical History  Diagnosis Date  . Hypertension   . Seizures   . Substance abuse     tobacco and alcohol  . Anxiety    Axis IV: medical issues Axis V: 41-50 serious symptoms  Past Medical History:  Past Medical History  Diagnosis Date  . Hypertension   . Seizures   . Substance abuse     tobacco and alcohol  . Anxiety     Past Surgical History  Procedure Laterality Date  . Hernia repair      Family History:  Family History  Problem Relation Age of Onset  . Diabetes Mother   . Alcohol abuse Father   . Hyperlipidemia Father   . Coronary artery disease Father   . Heart failure Father     Social History:  reports that he has been smoking.  He has quit using smokeless tobacco. He reports that he drinks alcohol. He reports that he does not use illicit drugs.  Additional Social  History:  Alcohol / Drug Use Pain Medications: Pt denies Prescriptions: Pt denies Over the Counter: Pt denies History of alcohol / drug use?: Yes Negative Consequences of Use: Personal relationships Withdrawal Symptoms: Tremors Substance #1 Name of Substance 1: alcohol 1 - Age of First Use: 17 1 - Amount (size/oz): 15 beers 1 - Frequency: daily 1 - Duration: 9 years 1 - Last Use / Amount: 1/11, 18 beers  CIWA: CIWA-Ar BP: 158/86 mmHg Pulse Rate: 111 Nausea and Vomiting: no nausea and no vomiting Tactile Disturbances: none Tremor: three Auditory Disturbances: not present Paroxysmal Sweats: no sweat visible Visual Disturbances: not present Anxiety: no anxiety, at ease Headache, Fullness in Head: none present Agitation: normal activity Orientation and Clouding of Sensorium: oriented and can do serial additions CIWA-Ar Total: 3 COWS:    Allergies: No Known Allergies  Home Medications:  (Not in a hospital admission)  OB/GYN Status:  No LMP for male patient.  General Assessment Data Location of Assessment: [facility] ACT Assessment: Yes Is this a Tele or Face-to-Face Assessment?: Face-to-Face Is this an Initial Assessment or a Re-assessment for this encounter?: Initial Assessment Living Arrangements: Children;Spouse/significant other Can pt return to current living arrangement?: Yes Admission Status: Voluntary  Is patient capable of signing voluntary admission?: Yes Transfer from: Acute Hospital Referral Source: MD     [facility] Crisis Care Plan Living Arrangements: Children;Spouse/significant other Name of Psychiatrist: none Name of Therapist: none  Education Status Is patient currently in school?: No  Risk to self Suicidal Ideation: No Suicidal Intent: No Is patient at risk for suicide?: No Suicidal Plan?: No Access to Means: No What has been your use of drugs/alcohol within the last 12 months?: current long term, heavy alcohol use Previous Attempts/Gestures:  No Intentional Self Injurious Behavior: None Family Suicide History: No Recent stressful life event(s): Other (Comment) (addiction. medical issues: neck surgery pending) Persecutory voices/beliefs?: No Depression: No Substance abuse history and/or treatment for substance abuse?: No Suicide prevention information given to non-admitted patients: Not applicable  Risk to Others Homicidal Ideation: No Thoughts of Harm to Others: No Current Homicidal Intent: No Current Homicidal Plan: No Access to Homicidal Means: No History of harm to others?: No Assessment of Violence: None Noted Does patient have access to weapons?: Yes (Comment) (several guns/knives in home) Criminal Charges Pending?: No Does patient have a court date: No  Psychosis Hallucinations: None noted Delusions: None noted  Mental Status Report Appear/Hygiene: Other (Comment) (casual) Eye Contact: Good Motor Activity: Unremarkable Speech: Logical/coherent Level of Consciousness: Alert Mood: Other (Comment) (pleasant) Affect: Appropriate to circumstance Anxiety Level: None Thought Processes: Coherent;Relevant Judgement: Unimpaired Orientation: Person;Place;Time;Situation Obsessive Compulsive Thoughts/Behaviors: None  Cognitive Functioning Concentration: Normal Memory: Recent Intact;Remote Intact IQ: Average Insight: Good Impulse Control: Poor Appetite: Good Weight Loss: 0 Weight Gain: 0 Sleep: No Change Vegetative Symptoms: None  ADLScreening [facility] Assessment Services) Patient's cognitive ability adequate to safely complete daily activities?: Yes Patient able to express need for assistance with ADLs?: Yes Independently performs ADLs?: Yes (appropriate for developmental age)  Prior Inpatient Therapy Prior Inpatient Therapy: No  Prior Outpatient Therapy Prior Outpatient Therapy: No  ADL Screening (condition at time of admission) Patient's cognitive ability adequate to safely complete daily activities?:  Yes Patient able to express need for assistance with ADLs?: Yes Independently performs ADLs?: Yes (appropriate for developmental age)       Abuse/Neglect Assessment (Assessment to be complete while patient is alone) Physical Abuse: Denies Verbal Abuse: Denies Sexual Abuse: Denies Exploitation of patient/patient's resources: Denies Self-Neglect: Denies Values / Beliefs Cultural Requests During Hospitalization: None Spiritual Requests During Hospitalization: None   Advance Directives (For Healthcare) Advance Directive: Patient does not have advance directive;Patient would not like information    Additional Information 1:1 In Past 12 Months?: No CIRT Risk: No Elopement Risk: No Does patient have medical clearance?: Yes     Disposition: Discussed this pt with Lala Lund at [facility], who agrees with plan for pt to enter detox program.  Also discussed with Shuvon Rankin of [facility], who will review pt.  NO beds at [facility] currently.  Insured pt: [employer].   1733: Pt accepted at [facility].  Shuvon Rankin to Dr. Dub Mikes.  306-2. Disposition Initial Assessment Completed for this Encounter: Yes Disposition of Patient: Inpatient treatment program Type of inpatient treatment program: Adult  On Site Evaluation by:   Reviewed with Physician:    Lorri Frederick 12/02/2013 3:57 PM

## 2013-12-02 NOTE — Progress Notes (Signed)
Patient did not attend the evening speaker AA meeting. Pt was a new admit to unit and remained in his room during the meeting. Pt had some questions about the unit and treatment that were answered.

## 2013-12-02 NOTE — Consult Note (Signed)
  Subjective:  Patient states that he needs help with detox.  "I drink about 15 beers a day.  No I have never had detox before or been in a psychiatric hospital."  Patient denies suicidal/homicidal ideation, psychosis, and paranoia.  Patient is having some tremors, diaphoresis, and anxiety.  Patient states that he does have a history of seizures at the age of [age] or [age] but was related to reading.  States that he has not had a seizure since.  Patient denies blackouts. Axis I: Alcohol Abuse Axis II: Deferred Axis III:  Past Medical History  Diagnosis Date  . Hypertension   . Seizures   . Substance abuse     tobacco and alcohol  . Anxiety    Axis IV: other psychosocial or environmental problems and problems related to social environment Axis V: 41-50 serious symptoms  Psychiatric Specialty Exam: Physical Exam  ROS  Blood pressure 158/86, pulse 111, temperature 98.9 F (37.2 C), temperature source Oral, resp. rate 16, SpO2 96.00%.There is no weight on file to calculate BMI.  General Appearance: Casual  Eye Contact::  Good  Speech:  Clear and Coherent and Normal Rate  Volume:  Normal  Mood:  Anxious  Affect:  Congruent  Thought Process:  Circumstantial, Coherent and Goal Directed  Orientation:  Full (Time, Place, and Person)  Thought Content:  "I need help with alcohol detox"  Suicidal Thoughts:  No  Homicidal Thoughts:  No  Memory:  Immediate;   Good Recent;   Good  Judgement:  Fair  Insight:  Fair  Psychomotor Activity:  Tremor  Concentration:  Good  Recall:  Good  Akathisia:  No  Handed:  Right  AIMS (if indicated):     Assets:  Communication Skills Desire for Improvement  Sleep:      Face to face consult and discussed with Dr. Lolly MustacheArfeen  Disposition:  Inpatient detox treatment.  [facility].  Start Librium protocol; monitor for safety and stabilization until [facility]  Shuvon B. Rankin FNP-BC  I agreed with the findings,  treatment and disposition plan of this patient. Kathryne SharperSyed Meghanne Pletz, MD

## 2013-12-02 NOTE — Progress Notes (Signed)
38 year old male pt admitted on voluntary basis. Pt requesting detox from ETOH and states that he has been drinking daily for many years and drinks up to 15 beers daily and stated that his daily usage is beginning to become earlier in the day. Pt reports that he is to have surgery on his neck but is unable to have that surgery until he gets detoxed. 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 Pt denies any SI or depression and is able to contract for safety on the unit. Pt was oriented to the unit and safety maintained.

## 2013-12-02 NOTE — ED Provider Notes (Signed)
CSN: [id number]     Arrival date & time 12/02/13  1318 History  This chart was scribed for non-physician practitioner, ,working with Geoffery Lyonsouglas Delo, MD, by Karle PlumberJennifer Tensley, ED Scribe.  This patient was seen in room [facility] and the patient's care was started at 2:56 PM.  Chief Complaint  Patient presents with  . Medical Clearance   The history is provided by the patient. No language interpreter was used.   HPI Comments:  Oscar Ruiz is a 38 y.o. male who presents to the Emergency Department needing detox from alcohol. Pt states he drinks approximately 15 beers daily for the past 17 years and occasionally consumes liquor. He states his last drink was approximately 13 hours ago. He has been experiencing tremors that have somewhat resolved. He denies any illicit drug use. He denies suicidal or homicidal ideations. He denies abdominal pain, nausea, or vomiting.  He reports h/o HTN in which he states he takes daily medications.  Past Medical History  Diagnosis Date  . Hypertension   . Seizures   . Substance abuse     tobacco and alcohol  . Anxiety    Past Surgical History  Procedure Laterality Date  . Hernia repair     Family History  Problem Relation Age of Onset  . Diabetes Mother   . Alcohol abuse Father   . Hyperlipidemia Father   . Coronary artery disease Father   . Heart failure Father    History  Substance Use Topics  . Smoking status: Current Every Day Smoker -- 2.00 packs/day  . Smokeless tobacco: Former NeurosurgeonUser  . Alcohol Use: Yes    Review of Systems  Constitutional: Negative for fever.  Gastrointestinal: Negative for nausea, vomiting and diarrhea.  Neurological: Positive for tremors.  Psychiatric/Behavioral: Negative for suicidal ideas and hallucinations.  All other systems reviewed and are negative.    Allergies  Review of patient's allergies indicates no known allergies.  Home Medications   Current Outpatient Rx  Name  Route  Sig  Dispense  Refill   . amLODipine (NORVASC) 5 MG tablet   Oral   Take 10 mg by mouth daily.         Marland Kitchen. atorvastatin (LIPITOR) 40 MG tablet   Oral   Take 1 tablet (40 mg total) by mouth daily.   90 tablet   3   . clonazePAM (KLONOPIN) 0.5 MG tablet      TAKE 1 TABLET BY MOUTH daily and additional dose prn later in day         . sertraline (ZOLOFT) 50 MG tablet   Oral   Take 100 mg by mouth daily.          Triage Vitals: BP 158/86  Pulse 111  Temp(Src) 98.9 F (37.2 C) (Oral)  Resp 16  SpO2 96% Physical Exam  Nursing note and vitals reviewed. Constitutional: He is oriented to person, place, and time. He appears well-developed and well-nourished. No distress.  HENT:  Head: Normocephalic and atraumatic.  Right Ear: External ear normal.  Left Ear: External ear normal.  Nose: Nose normal.  Eyes: Conjunctivae are normal.  Neck: Normal range of motion. No tracheal deviation present.  Cardiovascular: Normal rate, regular rhythm and normal heart sounds.   Pulmonary/Chest: Effort normal and breath sounds normal. No stridor.  Abdominal: Soft. He exhibits no distension. There is no tenderness.  Musculoskeletal: Normal range of motion.  Neurological: He is alert and oriented to person, place, and time.  Skin: Skin is warm  and dry. He is not diaphoretic.  Psychiatric: He has a normal mood and affect. His behavior is normal.    ED Course  Procedures (including critical care time) DIAGNOSTIC STUDIES: Oxygen Saturation is 96% on RA, adequate by my interpretation.   COORDINATION OF CARE: 3:02 PM- Consult to ACT team and CIWA. Pt verbalizes understanding and agrees to plan.  Medications  alum & mag hydroxide-simeth (MAALOX/MYLANTA) 200-200-20 MG/5ML suspension 30 mL (not administered)  ondansetron (ZOFRAN) tablet 4 mg (not administered)  nicotine (NICODERM CQ - dosed in mg/24 hours) patch 21 mg (not administered)  zolpidem (AMBIEN) tablet 5 mg (not administered)  ibuprofen (ADVIL,MOTRIN)  tablet 600 mg (not administered)  acetaminophen (TYLENOL) tablet 650 mg (not administered)  LORazepam (ATIVAN) tablet 1 mg (not administered)  LORazepam (ATIVAN) injection 0-4 mg (not administered)    Followed by  LORazepam (ATIVAN) injection 0-4 mg (not administered)  LORazepam (ATIVAN) tablet 0-4 mg (not administered)    Followed by  LORazepam (ATIVAN) tablet 0-4 mg (not administered)  thiamine (VITAMIN B-1) tablet 100 mg (not administered)    Or  thiamine (B-1) injection 100 mg (not administered)    Labs Review Labs Reviewed  CBC - Abnormal; Notable for the following:    WBC 12.3 (*)    MCHC 36.6 (*)    All other components within normal limits  COMPREHENSIVE METABOLIC PANEL - Abnormal; Notable for the following:    Sodium 134 (*)    Chloride 95 (*)    Glucose, Bld 112 (*)    AST 63 (*)    ALT 67 (*)    GFR calc non Af Amer 71 (*)    GFR calc Af Amer 83 (*)    All other components within normal limits  ETHANOL  URINE RAPID DRUG SCREEN (HOSP PERFORMED)   Imaging Review No results found.  EKG Interpretation   None       MDM   1. Alcohol abuse    Patient presents to ED for detox from EtOH. This is the first time in 17 years he has attempted detox. No hx of DTs. Pt currently feels well. NO SI/HI. Patient seen by Tammy Sours who recommends inpatient program which patient is agreeable to. Pt is medically cleared. Vital signs stable. Home medications reordered.   I personally performed the services described in this documentation, which was scribed in my presence. The recorded information has been reviewed and is accurate.    Mora Bellman, PA-C 12/02/13 1606

## 2013-12-02 NOTE — ED Notes (Signed)
[facility] to [facility] after supper

## 2013-12-02 NOTE — ED Notes (Addendum)
Wife at bedside.  Pt reports that he is questioning going to a 3-5 day detox program.  He does not want to do group/aa or be around other people.  Pt reports that he wants to be detoxed then dc'd and is not really interested in a program at this time, and that he thought it would be a more individual and not group.  Pt also reports that he has not had a seizure since high school, and that it was not determended what caused them

## 2013-12-02 NOTE — ED Notes (Signed)
Wife into see, [place] contact for transport

## 2013-12-02 NOTE — ED Notes (Signed)
[facility] and [facility], 1 bag of belonging sent w/ pt.

## 2013-12-03 DIAGNOSIS — F1021 Alcohol dependence, in remission: Secondary | ICD-10-CM | POA: Diagnosis present

## 2013-12-03 DIAGNOSIS — F410 Panic disorder [episodic paroxysmal anxiety] without agoraphobia: Secondary | ICD-10-CM

## 2013-12-03 DIAGNOSIS — F1020 Alcohol dependence, uncomplicated: Principal | ICD-10-CM

## 2013-12-03 DIAGNOSIS — F411 Generalized anxiety disorder: Secondary | ICD-10-CM

## 2013-12-03 MED ORDER — GABAPENTIN 100 MG PO CAPS
100.0000 mg | ORAL_CAPSULE | Freq: Three times a day (TID) | ORAL | Status: DC
  Administered 2013-12-03 – 2013-12-06 (×10): 100 mg via ORAL
  Filled 2013-12-03 (×14): qty 1

## 2013-12-03 NOTE — [facility] Suicide Risk Assessment (Signed)
[facility] INPATIENT: Family/Significant Other Suicide Prevention Education   Suicide Prevention Education:  Education Completed; No one has been identified by the patient as the family member/significant other with whom the patient will be residing, and identified as the person(s) who will aid the patient in the event of a mental health crisis (suicidal ideations/suicide attempt).   Pt did not c/o SI at admission, nor have they endorsed SI during their stay here. SPE not required. SPI pamphlet provided to pt and he was encouraged to share information with his support network, ask questions, and talk about any concerns.   The suicide prevention education provided includes the following:  Suicide risk factors  Suicide prevention and interventions  National Suicide Hotline telephone number  [facility] assessment telephone number  [facility] Emergency Assistance 911  [facility] and/or Residential Mobile Crisis Unit telephone number  Reyes IvanChelsea Horton, [place]CS[place] 12/03/2013  9:37 AM

## 2013-12-03 NOTE — [facility] Group Notes (Signed)
[facility]   12/03/2013 8:45 AM  Participation Quality:  Alert, Appropriate and Oriented  Mood/Affect:  Calm  Depression Rating:  0  Anxiety Rating:  0  Thoughts of Suicide:  Pt denies SI/HI  Will you contract for safety?   Yes  Current AVH:  Pt denies  Plan for Discharge/Comments:  Pt attended discharge planning group and actively participated in group.  CSW provided pt with today's workbook.  Pt reports his plan is to "stay sober" but not buying it.  Pt states that his PCP sent him here for detox before he can get neck surgery.  [place] and follow up with his PCP.  CSW will confirm pt's follow up appointment.  No further needs voiced by pt at this time.    Transportation Means: Pt reports access to transportation - wife will pick pt up  Supports: No supports mentioned at this time  Reyes IvanChelsea Horton, LCSW 12/03/2013 9:44 AM

## 2013-12-03 NOTE — [facility] Counselor (Signed)
Adult Comprehensive Assessment  Patient ID: Oscar Ruiz, male   DOB: 09-Mar-1976, 38 y.o.   MRN: [id number]  Information Source: Information source: Patient  Current Stressors:  Educational / Learning stressors: N/A Employment / Job issues: N/A Family Relationships: N/A Surveyor, quantity / Lack of resources (include bankruptcy): N/A Housing / Lack of housing: N/A Physical health (include injuries & life threatening diseases): needing neck surgery Social relationships: N/A Substance abuse: Alcohol abuse Bereavement / Loss: N/A  Living/Environment/Situation:  Living Arrangements: Spouse/significant other Living conditions (as described by patient or guardian): Pt lives with wife and two kids in [place].  Pt reports this is a good environment.  How long has patient lived in current situation?: 14 yearsx What is atmosphere in current home: Supportive;Loving;Comfortable  Family History:  Marital status: Married Number of Years Married: 20 What types of issues is patient dealing with in the relationship?: Pt reports no issues in the marriage and wife is supportive. Pt than states that he has been unfaithful to wife due to his drinking.   Additional relationship information: N/A Does patient have children?: Yes How many children?: 2 How is patient's relationship with their children?: Pt has a 49 and 8 year old.  Pt reports having a good relatoinship with his children.  Childhood History:  By whom was/is the patient raised?: Both parents;Other (Comment) Additional childhood history information: Pt states that he was raised by 4 parents, his parents and step parents.  Pt states that his childhood sucked but reports they did the best they could.  Description of patient's relationship with caregiver when they were a child: Pt reports getting along with parents okay growing up.  Patient's description of current relationship with people who raised him/her: Pt states that he doesn't talk to  his parents now.   Does patient have siblings?: Yes Number of Siblings: 5 Description of patient's current relationship with siblings: Pt reports being close to siblings.   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 How long has patient been employed?: 18 years Patient's job has been impacted by current illness: No What is the longest time patient has a held a job?: 18 years Where was the patient employed at that time?: current job Has patient ever been in the [employer]?: No Has patient ever served in Buyer, retail?: No  Financial Resources:   Financial resources: Income from [employer] insurance Does patient have a representative payee or guardian?: No  Alcohol/Substance Abuse:   What has been your use of drugs/alcohol within the last 12 months?: Alcohol - 15-18 beers daily for the past 17 years and reports withdrawal symptoms increasingly getting worse.   If attempted suicide, did drugs/alcohol play a role in this?: No Alcohol/Substance Abuse Treatment Hx: Denies past history If yes, describe treatment: N/A Has alcohol/substance abuse ever caused legal problems?: No  Social Support System:   Patient's Community Support System: Fair Describe Community Support System: Pt states that his wife is his only support Type of faith/religion: believes in God  How does patient's faith help to cope with  current illness?: prayer  Leisure/Recreation:   Leisure and Hobbies: hunting, fishing, boating  Strengths/Needs:   What things does the patient do well?: 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  [place].  Pt reports having upcoming neck surgery and his PCP wanting him to come here to detox off of alcohol first.  Patient will benefit from crisis stabilization, medication evaluation, group therapy and psycho education in addition to case management for discharge planning.    Horton, Salome Arnthelsea Nicole. 12/03/2013

## 2013-12-03 NOTE — Progress Notes (Signed)
Recreation Therapy Notes  Date: 01.12.2015 Time: 3:00pm Location: [address] Dayroom  Group Topic: Wellness  Goal Area(s) Addresses:  Patient will identify healthy investments in dimensions of wellness.  Patient will identify importance of balance in all dimensions of wellness.   Behavioral Response: Appropriate   Intervention: Group Activity  Activity: Wellness Dimensions. As a group patients were asked to identify healthy ways to invest in 6 dimensions of wellness: Physical, Social, Intellectual, Emotional, Environmental, Spiritual.   Education: Wellness, Discharge Planning, Coping Skills   Education Outcome: Acknowledges Education   Clinical Observations/Feedback: Patient actively engaged in group activity. Patient offered suggestions to group list, as well as identified ability to use activities on group list as coping skills. Patient additionally identified prospect of positive change if he invests in dimensions of wellness.    Marykay Lexenise L Emberli Ballester, LRT/CTRS  Jearl KlinefelterBlanchfield, Linken Mcglothen L 12/03/2013 4:22 PM

## 2013-12-03 NOTE — Progress Notes (Signed)
Recreation Therapy Notes  INPATIENT RECREATION THERAPY ASSESSMENT  Patient Stressors: Other: Blood Pressure High & Surgery  Coping Skills: Substance Abuse,   Leisure Interests: Sports,   Personal Challenges: Concentration, Social Interaction, Substance Abuse, Work International aid/development workererformance  Patient indicated he has/does not have a physical disability that would prevent participating in recreation therapy group sessions. PATIENT DID NOT ANSWER THIS QUESTION  Patient listed the following strengths: NONE LISTED  Patient indicated he would like to change the following about himself: Alcohol Abuse and health in general  Patient listed the following current recreation interests: Psychologist, clinicalCoach Baseball        Patient goal for hospitalization: "The goal is to sober."  Patient [place].   [name], LRT/CTRS  Oscar KlinefelterBlanchfield, Oscar Ruiz L 12/03/2013 4:17 PM

## 2013-12-03 NOTE — Progress Notes (Signed)
D: Pt denies SI/HI/AV. Pt is pleasant and cooperative. Pt denies depression and Helplessness/hopelessness.  A: Pt was offered support and encouragement. Pt was given scheduled medications. Pt was encourage to attend groups. Q 15 minute checks were done for safety.  R:Pt attends groups and interacts well with peers and staff. Pt taking medication. Pt has no complaints.Pt receptive to treatment and safety maintained on unit.

## 2013-12-03 NOTE — [facility] Suicide Risk Assessment (Signed)
Suicide Risk Assessment  Admission Assessment     Nursing information obtained from:    Demographic factors:    Current Mental Status:    Loss Factors:    Historical Factors:    Risk Reduction Factors:     CLINICAL FACTORS:   Alcohol/Substance Abuse/Dependencies  COGNITIVE FEATURES THAT CONTRIBUTE TO RISK:  Closed-mindedness Polarized thinking Thought constriction (tunnel vision)    SUICIDE RISK:   Moderate:  Frequent suicidal ideation with limited intensity, and duration, some specificity in terms of plans, no associated intent, good self-control, limited dysphoria/symptomatology, some risk factors present, and identifiable protective factors, including available and accessible social support.  PLAN OF CARE: Supportive approach/coping skills/relapse prevention                               Librium detox/reassess co morbidities  I certify that inpatient services furnished can reasonably be expected to improve the patient's condition.  Linder Prajapati A 12/03/2013, 5:32 PM

## 2013-12-03 NOTE — [facility] Group Notes (Signed)
[facility] LCSW Group Therapy  12/03/2013  1:15 PM   Type of Therapy:  Group Therapy  Participation Level:  Active  Participation Quality:  Attentive, Sharing and Supportive  Affect:  Calm  Cognitive:  Alert and Oriented  Insight:  Developing/Improving and Engaged  Engagement in Therapy:  Developing/Improving and Engaged  Modes of Intervention:  Clarification, Confrontation, Discussion, Education, Exploration, Limit-setting, Orientation, Problem-solving, Rapport Building, Dance movement psychotherapisteality Testing, Socialization and Support  Summary of Progress/Problems: Pt identified obstacles faced currently and processed barriers involved in overcoming these obstacles. Pt identified steps necessary for overcoming these obstacles and explored motivation (internal and external) for facing these difficulties head on. Pt further identified one area of concern in their lives and chose a goal to focus on for today.  Pt shared that his obstacle was his alcohol use and was able to overcome it by coming here.  Pt states that his motivation to stop drinking is his kids and the fact that he felt he would die by 51 if he didn't stop drinking.  Pt states that he plans to change his routine when he gets home by replacing the times he would drink with exercise or playing with his kids.  Pt actively participated and was engaged in group discussion.    Oscar IvanChelsea Horton, LCSW 12/03/2013 2:42 PM

## 2013-12-03 NOTE — Progress Notes (Signed)
Pt observed in the dayroom watching TV and talking with peers.  Pt reports he is doing fine this evening and is not having any withdrawal symptoms.  He is concerned about his BP staying elevated and says he feels he needs his blood pressure medication changed.  Discussed concerns with pt and suggested that maybe he needs another medication added instead of a change.  Encouraged pt to discuss with the MD and NP tomorrow.  Will check BP tonight before giving the Norvasc.  Pt reports no withdrawal symptoms at this time.  He denies SI/HI/AV at this time.  Pt states he has been going to groups and participating.  He wants to get clean for himself and his family.  He voices no other needs or concerns.  Support and encouragement offered.  Safety maintained with q15 minute checks.

## 2013-12-03 NOTE — H&P (Signed)
Psychiatric Admission Assessment Adult  Patient Identification:  Oscar Ruiz Date of Evaluation:  12/03/2013 Chief Complaint:  alcohol dependence History of Present Illness:: 38 Y/O male who states he drinks at least 15 beers a night. Has been drinking for  17 years and his tolerance has increased for that period of time. Occasional use of liquor on weekends.. Denies any prolonged sobriety. States that he suffers high blood pressure. States he starts panicking worrying he is going to have a stroke. This build up to having a panic attack. He is having problems with his neck and needs surgery. While he is drinking they are not going to do the surgery. He feels that his life is unmanageable. 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  Elements:  Location:  alcohol dependecence with underlying anxiety. Quality:  unable to function well as affecting his business, his relationship, his BP, TG. Severity:  severe. Timing:  every day after 4 PM starts drinking but recocnizes it is getting to be eralier and earlier. Duration:  building for 17 years no significant sobriety. Context:  alcohol dependency affecting his life in all areas. Associated Signs/Synptoms: Depression Symptoms:  Denies depression other than changes in his mood when he drinks (Hypo) Manic Symptoms:  Denies Anxiety Symptoms:  Excessive Worry, Panic Symptoms, Psychotic Symptoms:  Denies PTSD Symptoms: Denies  Psychiatric Specialty Exam: Physical Exam  Review of Systems  Constitutional: Positive for malaise/fatigue.  Eyes: Negative.   Respiratory: Positive for shortness of breath.        Pack and a half to two packs a day  Cardiovascular: Negative.   Gastrointestinal: Negative.   Genitourinary: Negative.   Musculoskeletal: Positive for back pain and neck pain.  Skin: Negative.   Neurological: Positive for tremors and weakness.  Endo/Heme/Allergies: Negative.   Psychiatric/Behavioral:  Positive for substance abuse. The patient is nervous/anxious.     Blood pressure 151/98, pulse 101, temperature 97.6 F (36.4 C), temperature source Oral, resp. rate 20, height 5' 8" (1.727 m), weight 92.534 kg (204 lb).Body mass index is 31.03 kg/(m^2).  General Appearance: Fairly Groomed  Engineer, water::  Fair  Speech:  Clear and Coherent  Volume:  Normal  Mood:  Anxious, Depressed and worried  Affect:  anxious, worried  Thought Process:  Coherent and Goal Directed  Orientation:  Full (Time, Place, and Person)  Thought Content:  symtpoms, worries, concerns  Suicidal Thoughts:  No  Homicidal Thoughts:  No  Memory:  Immediate;   Fair Recent;   Fair Remote;   Fair  Judgement:  Fair  Insight:  Present and Shallow  Psychomotor Activity:  Restlessness  Concentration:  Fair  Recall:  Fair  Akathisia:  No  Handed:    AIMS (if indicated):     Assets:  Desire for Improvement  Sleep:  Number of Hours: 5.25    Past Psychiatric History: Diagnosis:  Hospitalizations: Denies  Outpatient Care: Denies  Substance Abuse Care: Denies  Self-Mutilation: Denies  Suicidal Attempts:Denies  Violent Behaviors:Denies   Past Medical History:   Past Medical History  Diagnosis Date  . Hypertension   . Seizures   . Substance abuse     tobacco and alcohol  . Anxiety    Seizure History:  [age] had four and was on Depakote states he was told anxiety, only when he read Allergies:  No Known Allergies PTA Medications: Prescriptions prior to admission  Medication Sig Dispense Refill  . amLODipine (NORVASC) 5 MG tablet Take 10  mg by mouth daily.      . atorvastatin (LIPITOR) 40 MG tablet Take 1 tablet (40 mg total) by mouth daily.  90 tablet  3  . clonazePAM (KLONOPIN) 0.5 MG tablet TAKE 1 TABLET BY MOUTH daily and additional dose prn later in day      . sertraline (ZOLOFT) 50 MG tablet Take 100 mg by mouth daily.        Previous Psychotropic Medications:  Medication/Dose  Zoloft for anxiety                Substance Abuse History in the last 12 months:  yes  Consequences of Substance Abuse: Withdrawal Symptoms:   Diarrhea Tremors Increased blood pressure Blackouts Social History:  reports that he has been smoking.  He has quit using smokeless tobacco. He reports that he drinks alcohol. He reports that he does not use illicit drugs. Additional Social History:                      Current Place of Residence: Lives with his wife, 20 years Place of Birth:   Family Members: Marital Status:  Married Children:  Sons: 9  Daughters: 14 Relationships: Education:  nineth grade, started having the seizures, wanted to work Educational Problems/Performance: Religious Beliefs/Practices: Christian History of Abuse (Emotional/Phsycial/Sexual)Denies Occupational Experiences; plumber Military History:  None. Legal History: Hobbies/Interests:  Family History:   Family History  Problem Relation Age of Onset  . Diabetes Mother   . Alcohol abuse Father   . Hyperlipidemia Father   . Coronary artery disease Father   . Heart failure Father     Results for orders placed during the hospital encounter of 12/02/13 (from the past 72 hour(s))  CBC     Status: Abnormal   Collection Time    12/02/13  1:55 PM      Result Value Range   WBC 12.3 (*) 4.0 - 10.5 K/uL   RBC 5.07  4.22 - 5.81 MIL/uL   Hemoglobin 15.8  13.0 - 17.0 g/dL   HCT 43.2  39.0 - 52.0 %   MCV 85.2  78.0 - 100.0 fL   MCH 31.2  26.0 - 34.0 pg   MCHC 36.6 (*) 30.0 - 36.0 g/dL   RDW 12.3  11.5 - 15.5 %   Platelets 172  150 - 400 K/uL  COMPREHENSIVE METABOLIC PANEL     Status: Abnormal   Collection Time    12/02/13  1:55 PM      Result Value Range   Sodium 134 (*) 137 - 147 mEq/L   Potassium 3.9  3.7 - 5.3 mEq/L   Chloride 95 (*) 96 - 112 mEq/L   CO2 23  19 - 32 mEq/L   Glucose, Bld 112 (*) 70 - 99 mg/dL   BUN 13  6 - 23 mg/dL   Creatinine, Ser 1.27  0.50 - 1.35 mg/dL   Calcium 9.0  8.4 - 10.5 mg/dL    Total Protein 7.6  6.0 - 8.3 g/dL   Albumin 4.4  3.5 - 5.2 g/dL   AST 63 (*) 0 - 37 U/L   ALT 67 (*) 0 - 53 U/L   Alkaline Phosphatase 73  39 - 117 U/L   Total Bilirubin 0.4  0.3 - 1.2 mg/dL   GFR calc non Af Amer 71 (*) >90 mL/min   GFR calc Af Amer 83 (*) >90 mL/min   Comment: (NOTE)     The eGFR has been calculated using   the CKD EPI equation.     This calculation has not been validated in all clinical situations.     eGFR's persistently <90 mL/min signify possible Chronic Kidney     Disease.  ETHANOL     Status: None   Collection Time    12/02/13  1:55 PM      Result Value Range   Alcohol, Ethyl (B) <11  0 - 11 mg/dL   Comment:            LOWEST DETECTABLE LIMIT FOR     SERUM ALCOHOL IS 11 mg/dL     FOR MEDICAL PURPOSES ONLY  URINE RAPID DRUG SCREEN (HOSP PERFORMED)     Status: None   Collection Time    12/02/13  2:08 PM      Result Value Range   Opiates NONE DETECTED  NONE DETECTED   Cocaine NONE DETECTED  NONE DETECTED   Benzodiazepines NONE DETECTED  NONE DETECTED   Amphetamines NONE DETECTED  NONE DETECTED   Tetrahydrocannabinol NONE DETECTED  NONE DETECTED   Barbiturates NONE DETECTED  NONE DETECTED   Comment:            DRUG SCREEN FOR MEDICAL PURPOSES     ONLY.  IF CONFIRMATION IS NEEDED     FOR ANY PURPOSE, NOTIFY LAB     WITHIN 5 DAYS.                LOWEST DETECTABLE LIMITS     FOR URINE DRUG SCREEN     Drug Class       Cutoff (ng/mL)     Amphetamine      1000     Barbiturate      200     Benzodiazepine   200     Tricyclics       300     Opiates          300     Cocaine          300     THC              50   Psychological Evaluations:  Assessment:   DSM5:  Schizophrenia Disorders:  none Obsessive-Compulsive Disorders:  none Trauma-Stressor Disorders:  none Substance/Addictive Disorders:  Alcohol Related Disorder - Severe (303.90) Depressive Disorders:  Major Depressive Disorder - Moderate (296.22)  AXIS I:  Anxiety NOS, Panic Attacks AXIS  II:  Deferred AXIS III:   Past Medical History  Diagnosis Date  . Hypertension   . Seizures   . Substance abuse     tobacco and alcohol  . Anxiety    AXIS IV:  occupational problems, other psychosocial or environmental problems and problems with primary support group AXIS V:  41-50 serious symptoms  Treatment Plan/Recommendations:  Supportive approach/coping skills/relapse prevention                                                                 Detox with Librium                                                                   Reassess and address the co morbidities  Treatment Plan Summary: Daily contact with patient to assess and evaluate symptoms and progress in treatment Medication management Current Medications:  Current Facility-Administered Medications  Medication Dose Route Frequency Provider Last Rate Last Dose  . acetaminophen (TYLENOL) tablet 650 mg  650 mg Oral Q4H PRN Shuvon Rankin, NP      . alum & mag hydroxide-simeth (MAALOX/MYLANTA) 200-200-20 MG/5ML suspension 30 mL  30 mL Oral PRN Shuvon Rankin, NP      . amLODipine (NORVASC) tablet 10 mg  10 mg Oral QHS Douglas Delo, MD   10 mg at 12/02/13 2200  . atorvastatin (LIPITOR) tablet 40 mg  40 mg Oral QHS Douglas Delo, MD   40 mg at 12/02/13 2200  . chlordiazePOXIDE (LIBRIUM) capsule 25 mg  25 mg Oral Q6H PRN Shuvon Rankin, NP      . chlordiazePOXIDE (LIBRIUM) capsule 25 mg  25 mg Oral QID Shuvon Rankin, NP   25 mg at 12/03/13 0820   Followed by  . [START ON 12/04/2013] chlordiazePOXIDE (LIBRIUM) capsule 25 mg  25 mg Oral TID Shuvon Rankin, NP       Followed by  . [START ON 12/05/2013] chlordiazePOXIDE (LIBRIUM) capsule 25 mg  25 mg Oral BH-qamhs Shuvon Rankin, NP       Followed by  . [START ON 12/06/2013] chlordiazePOXIDE (LIBRIUM) capsule 25 mg  25 mg Oral Daily Shuvon Rankin, NP      . ibuprofen (ADVIL,MOTRIN) tablet 600 mg  600 mg Oral Q8H PRN Shuvon Rankin, NP      . loperamide (IMODIUM) capsule 2-4 mg  2-4 mg Oral  PRN Shuvon Rankin, NP      . magnesium hydroxide (MILK OF MAGNESIA) suspension 30 mL  30 mL Oral Daily PRN Shuvon Rankin, NP      . multivitamin with minerals tablet 1 tablet  1 tablet Oral Daily Shuvon Rankin, NP   1 tablet at 12/03/13 0820  . nicotine (NICODERM CQ - dosed in mg/24 hours) patch 21 mg  21 mg Transdermal Daily Shuvon Rankin, NP   21 mg at 12/03/13 0820  . ondansetron (ZOFRAN-ODT) disintegrating tablet 4 mg  4 mg Oral Q6H PRN Shuvon Rankin, NP      . sertraline (ZOLOFT) tablet 100 mg  100 mg Oral QHS Douglas Delo, MD   100 mg at 12/02/13 2200  . thiamine (VITAMIN B-1) tablet 100 mg  100 mg Oral Daily Shuvon Rankin, NP   100 mg at 12/03/13 0820   Or  . thiamine (B-1) injection 100 mg  100 mg Intravenous Daily Shuvon Rankin, NP        Observation Level/Precautions:  15 minute checks  Laboratory:  As per the ED  Psychotherapy:  Individual/group  Medications:  Librium/assess for other psychotropics  Consultations:    Discharge Concerns:    Estimated LOS: 3-5 days  Other:     I certify that inpatient services furnished can reasonably be expected to improve the patient's condition.   LUGO,IRVING A 1/12/201510:58 AM   

## 2013-12-03 NOTE — Progress Notes (Signed)
Adult Psychoeducational Group Note  Date:  12/03/2013 Time:  10:38 PM  Group Topic/Focus:  Wrap-Up Group:   The focus of this group is to help patients review their daily goal of treatment and discuss progress on daily workbooks.  Participation Level:  Minimal  Participation Quality:  Appropriate and Attentive  Affect:  Appropriate  Cognitive:  Alert, Appropriate and Oriented  Insight: Appropriate  Engagement in Group:  Engaged  Modes of Intervention:  Discussion, Education and Support  Additional Comments:  Pt stated that he attended all groups today and ate well for the first time in years.   Humberto SealsWhitaker, Uzair Godley [name] 12/03/2013, 10:38 PM

## 2013-12-03 NOTE — Tx Team (Signed)
Interdisciplinary Treatment Plan Update (Adult)  Date: 12/03/2013  Time Reviewed:  9:45 AM  Progress in Treatment: Attending groups: Yes Participating in groups:  Yes Taking medication as prescribed:  Yes Tolerating medication:  Yes Family/Significant othe contact made: CSW assessing Patient understands diagnosis:  Yes Discussing patient identified problems/goals with staff:  Yes Medical problems stabilized or resolved:  Yes Denies suicidal/homicidal ideation: Yes Issues/concerns per patient self-inventory:  Yes Other:  New problem(s) identified: N/A  Discharge Plan or Barriers: CSW assessing for appropriate referrals.    Reason for Continuation of Hospitalization: Anxiety Depression Detox Medication Stabilization  Comments: N/A  Estimated length of stay: 2-3 days   For review of initial/current patient goals, please see plan of care.  Attendees: Patient:     Family:     Physician:  Dr. Dub MikesLugo 12/03/2013 10:11 AM   Nursing:   [place] Minersonna Shimp, RN 12/03/2013 10:11 AM   Clinical Social Worker:  Reyes Ivanhelsea Horton, LCSW 12/03/2013 10:11 AM   Other: Onnie BoerJennifer Clark, RN case manager 12/03/2013 10:11 AM   Other:  Trula SladeHeather Smart, LCSWA 12/03/2013 10:11 AM   Other:  Tomasita Morrowelora Sutton, care coordinator 12/03/2013 10:12 AM   Other:  Omelia BlackwaterNoelle Ardley, RN 12/03/2013 10:12 AM   Other:    Other:    Other:    Other:    Other:    Other:     Scribe for Treatment Team:   Carmina MillerHorton, Hajira Verhagen Nicole, 12/03/2013 , 10:11 AM

## 2013-12-04 DIAGNOSIS — F321 Major depressive disorder, single episode, moderate: Secondary | ICD-10-CM

## 2013-12-04 DIAGNOSIS — F1994 Other psychoactive substance use, unspecified with psychoactive substance-induced mood disorder: Secondary | ICD-10-CM

## 2013-12-04 NOTE — [facility] Group Notes (Signed)
[facility] LCSW Group Therapy  12/04/2013  1:15 PM   Type of Therapy:  Group Therapy  Participation Level:  Active  Participation Quality:  Attentive, Sharing and Supportive  Affect:  Flat and Depressed  Cognitive:  Alert and Oriented  Insight:  Developing/Improving and Engaged  Engagement in Therapy:  Developing/Improving and Engaged  Modes of Intervention:  Activity, Clarification, Confrontation, Discussion, Education, Exploration, Limit-setting, Orientation, Problem-solving, Rapport Building, Reality Testing, Socialization and Support  Summary of Progress/Problems: Patient was attentive and engaged with speaker from [employer].  Patient was attentive to speaker while they shared their story of dealing with mental health and overcoming it.  Patient expressed interest in their programs and services and received information on their agency.  Patient processed ways they can relate to the speaker.     Erland Vivas Horton, LCSW 12/04/2013 1:22 PM   

## 2013-12-04 NOTE — Progress Notes (Signed)
D: Patient denies SI/HI and A/V hallucinations; patient reports sleep is well; reports appetite is good; reports energy level is normal; reports ability to pay attention is improving; rates depression as 0/10; rates hopelessness 0/10; patient had no other complaints and denies all withdrawal symptoms  A: Monitored q 15 minutes; patient encouraged to attend groups; patient educated about medications; patient given medications per physician orders; patient encouraged to express feelings and/or concerns  R: Patient is cooperative and pleasant; patient is animated and laughing and joking; patient engages and is talkative; patient's interaction with staff and peers is approrpiate; patient was able to set goal to talk with staff 1:1 when having feelings of SI; patient is taking medications as prescribed and tolerating medications; patient is attending all groups and engaging

## 2013-12-04 NOTE — Progress Notes (Signed)
Patient ID: Oscar Ruiz, male   DOB: 12-02-1975, 38 y.o.   MRN: [id number] Pt visible in the milieu.  Interacting appropriately with staff and peers.  Attending groups.  Needs assessed.  Pt denied.  Fifteen minute checks in progress. Pt safe on unit.

## 2013-12-04 NOTE — Progress Notes (Signed)
[facility] MD Progress Note  12/04/2013 5:32 PM Oscar Ruiz  MRN:  [id number] Subjective:  Oscar Ruiz states that he is committed to abstinence, his wife is supportive. He is still endorses persistent anxiety but he admits it is getting better. His pain is still an issue but states that it is manageable. Still concerned about the BP but it seems to be getting more stable. Will reassess his BP management. Wants to be sure he is past the possibility of having seizures Diagnosis:   DSM5: Schizophrenia Disorders:  none Obsessive-Compulsive Disorders:  none Trauma-Stressor Disorders:  none Substance/Addictive Disorders:  Alcohol Related Disorder - Severe (303.90) Depressive Disorders:  Major Depressive Disorder - Moderate (296.22)  Axis I: Anxiety Disorder NOS and Substance Induced Mood Disorder  ADL's:  Intact  Sleep: Fair  Appetite:  Fair  Suicidal Ideation:  Plan:  denies Intent:  denies Means:  denies Homicidal Ideation:  Plan:  denies Intent:  denies Means:  denies AEB (as evidenced by):  Psychiatric Specialty Exam: Review of Systems  Constitutional: Negative.   HENT: Negative.   Eyes: Negative.   Respiratory: Negative.   Cardiovascular: Negative.   Gastrointestinal: Negative.   Genitourinary: Negative.   Musculoskeletal: Negative.   Skin: Negative.   Neurological: Negative.   Endo/Heme/Allergies: Negative.   Psychiatric/Behavioral: Positive for substance abuse. The patient is nervous/anxious.     Blood pressure 121/85, pulse 69, temperature 97.7 F (36.5 C), temperature source Oral, resp. rate 16, height 5\' 8"  (1.727 m), weight 92.534 kg (204 lb).Body mass index is 31.03 kg/(m^2).  General Appearance: Fairly Groomed  Patent attorney::  Fair  Speech:  Clear and Coherent  Volume:  Normal  Mood:  Anxious and worried  Affect:  anxious, worried  Thought Process:  Coherent and Goal Directed  Orientation:  Full (Time, Place, and Person)  Thought Content:  symptoms, worries,  concerns  Suicidal Thoughts:  No  Homicidal Thoughts:  No  Memory:  Immediate;   Fair Recent;   Fair Remote;   Fair  Judgement:  Fair  Insight:  Present  Psychomotor Activity:  Restlessness  Concentration:  Fair  Recall:  Fair  Akathisia:  No  Handed:    AIMS (if indicated):     Assets:  Desire for Improvement Housing  Sleep:  Number of Hours: 6   Current Medications: Current Facility-Administered Medications  Medication Dose Route Frequency Provider Last Rate Last Dose  . acetaminophen (TYLENOL) tablet 650 mg  650 mg Oral Q4H PRN Shuvon Rankin, NP      . alum & mag hydroxide-simeth (MAALOX/MYLANTA) 200-200-20 MG/5ML suspension 30 mL  30 mL Oral PRN Shuvon Rankin, NP      . amLODipine (NORVASC) tablet 10 mg  10 mg Oral QHS Geoffery Lyons, MD   10 mg at 12/03/13 2141  . atorvastatin (LIPITOR) tablet 40 mg  40 mg Oral QHS Geoffery Lyons, MD   40 mg at 12/03/13 2141  . chlordiazePOXIDE (LIBRIUM) capsule 25 mg  25 mg Oral Q6H PRN Shuvon Rankin, NP      . [START ON 12/05/2013] chlordiazePOXIDE (LIBRIUM) capsule 25 mg  25 mg Oral BH-qamhs Shuvon Rankin, NP       Followed by  . [START ON 12/06/2013] chlordiazePOXIDE (LIBRIUM) capsule 25 mg  25 mg Oral Daily Shuvon Rankin, NP      . gabapentin (NEURONTIN) capsule 100 mg  100 mg Oral TID Rachael Fee, MD   100 mg at 12/04/13 1654  . ibuprofen (ADVIL,MOTRIN) tablet 600 mg  600  mg Oral Q8H PRN Shuvon Rankin, NP      . loperamide (IMODIUM) capsule 2-4 mg  2-4 mg Oral PRN Shuvon Rankin, NP      . magnesium hydroxide (MILK OF MAGNESIA) suspension 30 mL  30 mL Oral Daily PRN Shuvon Rankin, NP      . multivitamin with minerals tablet 1 tablet  1 tablet Oral Daily Shuvon Rankin, NP   1 tablet at 12/04/13 0800  . nicotine (NICODERM CQ - dosed in mg/24 hours) patch 21 mg  21 mg Transdermal Daily Shuvon Rankin, NP   21 mg at 12/04/13 0800  . ondansetron (ZOFRAN-ODT) disintegrating tablet 4 mg  4 mg Oral Q6H PRN Shuvon Rankin, NP      . sertraline (ZOLOFT)  tablet 100 mg  100 mg Oral QHS Geoffery Lyonsouglas Delo, MD   100 mg at 12/03/13 2141  . thiamine (VITAMIN B-1) tablet 100 mg  100 mg Oral Daily Shuvon Rankin, NP   100 mg at 12/04/13 0801   Or  . thiamine (B-1) injection 100 mg  100 mg Intravenous Daily Shuvon Rankin, NP        Lab Results: No results found for this or any previous visit (from the past 48 hour(s)).  Physical Findings: AIMS: Facial and Oral Movements Muscles of Facial Expression: None, normal Lips and Perioral Area: None, normal Jaw: None, normal Tongue: None, normal,Extremity Movements Upper (arms, wrists, hands, fingers): None, normal Lower (legs, knees, ankles, toes): None, normal, Trunk Movements Neck, shoulders, hips: None, normal, Overall Severity Severity of abnormal movements (highest score from questions above): None, normal Incapacitation due to abnormal movements: None, normal Patient's awareness of abnormal movements (rate only patient's report): No Awareness, Dental Status Current problems with teeth and/or dentures?: No Does patient usually wear dentures?: No  CIWA:  CIWA-Ar Total: 0 COWS:     Treatment Plan Summary: Daily contact with patient to assess and evaluate symptoms and progress in treatment Medication management  Plan: Supportive approach/coping skills/relapse prevention           Continue detox           Continue to assess BP control           Address pain management  Medical Decision Making Problem Points:  Review of psycho-social stressors (1) Data Points:  Review of medication regiment & side effects (2)  I certify that inpatient services furnished can reasonably be expected to improve the patient's condition.   Stephenson Cichy A 12/04/2013, 5:32 PM

## 2013-12-04 NOTE — Progress Notes (Signed)
Adult Psychoeducational Group Note  Date:  12/04/2013 Time:  2000  Group Topic/Focus:  AA  Participation Level:  Active  Participation Quality:  Appropriate and Attentive  Affect:  Appropriate  Cognitive:  Alert, Appropriate and Oriented  Insight: Appropriate and Good  Engagement in Group:  Engaged  Modes of Intervention:  Discussion and Support  Additional Comments:    Oscar SealsWhitaker, Oscar Ruiz [name] 12/04/2013, 11:26 PM

## 2013-12-04 NOTE — Progress Notes (Signed)
Recreation Therapy Notes  nimal-Assisted Activity/Therapy (AAA/T) Program Checklist/Progress Notes Patient Eligibility Criteria Checklist & Daily Group note for Rec Tx Intervention  Date: 01.13.2015 Time: 2:45pm Location: [address] Dayroom   AAA/T Program Assumption of Risk Form signed by Patient/ or Parent Legal Guardian yes  Patient is free of allergies or sever asthma yes  Patient reports no fear of animals yes  Patient reports no history of cruelty to animals yes   Patient understands his/her participation is voluntary yes  Patient washes hands before animal contact yes  Patient washes hands after animal contact yes  Behavioral Response: Appropriate   Education: Hand Washing, Appropriate Animal Interaction   Education Outcome: Acknowledges understanding   Clinical Observations/Feedback: Patient interacted appropriately with therapy dog team, peers and LRT.   Oscar Ruiz L Oscar Ruiz, LRT/CTRS  Oscar Ruiz L 12/04/2013 5:21 PM 

## 2013-12-04 NOTE — ED Provider Notes (Signed)
Medical screening examination/treatment/procedure(s) were performed by non-physician practitioner and as supervising physician I was immediately available for consultation/collaboration.     Jodeci Roarty, MD 12/04/13 2205 

## 2013-12-05 NOTE — [facility] Group Notes (Signed)
[facility]   12/05/2013 8:45 AM  Participation Quality:  Alert, Appropriate and Oriented  Mood/Affect:  Calm  Depression Rating:  0  Anxiety Rating:  0  Thoughts of Suicide:  Pt denies SI/HI  Will you contract for safety?   Yes  Current AVH:  Pt denies  Plan for Discharge/Comments:  Pt attended discharge planning group and actively participated in group.  CSW provided pt with today's workbook.  Pt reports feeling good today.  [place] and follow [place] at [place] with his PCP for medication management.  No further needs voiced by pt at this time.    Transportation Means: Pt reports access to transportation - wife will pick pt up  Supports: No supports mentioned at this time  Reyes IvanChelsea Horton, LCSW 12/05/2013 9:17 AM

## 2013-12-05 NOTE — Progress Notes (Signed)
[facility] MD Progress Note  12/05/2013 3:07 PM Oscar Ruiz  MRN:  [id number] Subjective:  Oscar Ruiz states that he is really committed. He has had some increase BP reading but the last ones are more in the normal range. He states he has his wife's support. 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 His family of origin he has nothing to do with as they would be instrumental in triggering his relapse.  Diagnosis:   DSM5: Schizophrenia Disorders:  none Obsessive-Compulsive Disorders:  none Trauma-Stressor Disorders:  none Substance/Addictive Disorders:  Alcohol Related Disorder - Severe (303.90) Depressive Disorders:  Major Depressive Disorder - Moderate (296.22)  Axis I: Anxiety Disorder NOS  ADL's:  Intact  Sleep: Fair  Appetite:  Fair  Suicidal Ideation:  Plan:  denies Intent:  denies Means:  denies Homicidal Ideation:  Plan:  denies Intent:  denies Means:  denies AEB (as evidenced by):  Psychiatric Specialty Exam: Review of Systems  Constitutional: Negative.   HENT: Negative.   Eyes: Negative.   Respiratory: Negative.   Cardiovascular: Negative.   Gastrointestinal: Negative.   Genitourinary: Negative.   Musculoskeletal: Negative.   Skin: Negative.   Neurological: Negative.   Endo/Heme/Allergies: Negative.   Psychiatric/Behavioral: Positive for substance abuse. The patient is nervous/anxious.     Blood pressure 118/87, pulse 94, temperature 97.7 F (36.5 C), temperature source Oral, resp. rate 20, height 5\' 8"  (1.727 m), weight 92.534 kg (204 lb).Body mass index is 31.03 kg/(m^2).  General Appearance: Fairly Groomed  Patent attorney::  Fair  Speech:  Clear and Coherent  Volume:  Normal  Mood:  Anxious and worried  Affect:  Appropriate  Thought Process:  Coherent and Goal Directed  Orientation:  Full (Time, Place, and Person)  Thought Content:  worries, concerns, symptoms  Suicidal Thoughts:  No   Homicidal Thoughts:  No  Memory:  Immediate;   Fair Recent;   Fair Remote;   Fair  Judgement:  Fair  Insight:  Present  Psychomotor Activity:  Restlessness  Concentration:  Fair  Recall:  Fair  Akathisia:  No  Handed:    AIMS (if indicated):     Assets:  Desire for Improvement Housing Social Support Vocational/Educational  Sleep:  Number of Hours: 5.5   Current Medications: Current Facility-Administered Medications  Medication Dose Route Frequency Provider Last Rate Last Dose  . acetaminophen (TYLENOL) tablet 650 mg  650 mg Oral Q4H PRN Shuvon Rankin, NP      . alum & mag hydroxide-simeth (MAALOX/MYLANTA) 200-200-20 MG/5ML suspension 30 mL  30 mL Oral PRN Shuvon Rankin, NP      . amLODipine (NORVASC) tablet 10 mg  10 mg Oral QHS Geoffery Lyons, MD   10 mg at 12/04/13 2210  . atorvastatin (LIPITOR) tablet 40 mg  40 mg Oral QHS Geoffery Lyons, MD   40 mg at 12/04/13 2210  . chlordiazePOXIDE (LIBRIUM) capsule 25 mg  25 mg Oral Q6H PRN Shuvon Rankin, NP      . chlordiazePOXIDE (LIBRIUM) capsule 25 mg  25 mg Oral BH-qamhs Shuvon Rankin, NP   25 mg at 12/05/13 0800   Followed by  . [START ON 12/06/2013] chlordiazePOXIDE (LIBRIUM) capsule 25 mg  25 mg Oral Daily Shuvon Rankin, NP      . gabapentin (NEURONTIN) capsule 100 mg  100 mg Oral TID Rachael Fee, MD   100 mg at 12/05/13 1150  . ibuprofen (ADVIL,MOTRIN)  tablet 600 mg  600 mg Oral Q8H PRN Shuvon Rankin, NP      . loperamide (IMODIUM) capsule 2-4 mg  2-4 mg Oral PRN Shuvon Rankin, NP      . magnesium hydroxide (MILK OF MAGNESIA) suspension 30 mL  30 mL Oral Daily PRN Shuvon Rankin, NP      . multivitamin with minerals tablet 1 tablet  1 tablet Oral Daily Shuvon Rankin, NP   1 tablet at 12/05/13 0759  . nicotine (NICODERM CQ - dosed in mg/24 hours) patch 21 mg  21 mg Transdermal Daily Shuvon Rankin, NP   21 mg at 12/05/13 0800  . ondansetron (ZOFRAN-ODT) disintegrating tablet 4 mg  4 mg Oral Q6H PRN Shuvon Rankin, NP      . sertraline  (ZOLOFT) tablet 100 mg  100 mg Oral QHS Geoffery Lyonsouglas Delo, MD   100 mg at 12/04/13 2210  . thiamine (VITAMIN B-1) tablet 100 mg  100 mg Oral Daily Shuvon Rankin, NP   100 mg at 12/05/13 0759   Or  . thiamine (B-1) injection 100 mg  100 mg Intravenous Daily Shuvon Rankin, NP        Lab Results: No results found for this or any previous visit (from the past 48 hour(s)).  Physical Findings: AIMS: Facial and Oral Movements Muscles of Facial Expression: None, normal Lips and Perioral Area: None, normal Jaw: None, normal Tongue: None, normal,Extremity Movements Upper (arms, wrists, hands, fingers): None, normal Lower (legs, knees, ankles, toes): None, normal, Trunk Movements Neck, shoulders, hips: None, normal, Overall Severity Severity of abnormal movements (highest score from questions above): None, normal Incapacitation due to abnormal movements: None, normal Patient's awareness of abnormal movements (rate only patient's report): No Awareness, Dental Status Current problems with teeth and/or dentures?: No Does patient usually wear dentures?: No  CIWA:  CIWA-Ar Total: 0 COWS:     Treatment Plan Summary: Daily contact with patient to assess and evaluate symptoms and progress in treatment Medication management  Plan: Supportive approach/coping skills/relapse prevention           Reassess and address the co morbidities  Medical Decision Making Problem Points:  Review of psycho-social stressors (1) Data Points:  Review of medication regiment & side effects (2)  I certify that inpatient services furnished can reasonably be expected to improve the patient's condition.   Annalea Alguire A 12/05/2013, 3:07 PM

## 2013-12-05 NOTE — Tx Team (Addendum)
Interdisciplinary Treatment Plan Update (Adult)  Date: 12/05/2013  Time Reviewed:  9:45 AM  Progress in Treatment: Attending groups: Yes Participating in groups:  Yes Taking medication as prescribed:  Yes Tolerating medication:  Yes Family/Significant othe contact made: No, n/a Patient understands diagnosis:  Yes Discussing patient identified problems/goals with staff:  Yes Medical problems stabilized or resolved:  Yes Denies suicidal/homicidal ideation: Yes Issues/concerns per patient self-inventory:  Yes Other:  New problem(s) identified: N/A  Discharge Plan or Barriers: Pt has follow up scheduled at [place] with his PCP for medication management  Reason for Continuation of Hospitalization: Anxiety Depression Detox Medication Stabilization  Comments: N/A  Estimated length of stay: 1 day, d/c tomorrow  For review of initial/current patient goals, please see plan of care.  Attendees: Patient:     Family:     Physician:  Dr. Dub MikesLugo 12/05/2013 10:36 AM   Nursing:   [place] Minersonna Shimp, RN 12/05/2013 10:36 AM   Clinical Social Worker:  Reyes Ivanhelsea Horton, LCSW 12/05/2013 10:36 AM   Other: Onnie BoerJennifer Clark, RN case manager 12/05/2013 10:36 AM   Other:  Trula SladeHeather Smart, LCSWA 12/05/2013 10:36 AM   Other:  Serena ColonelAggie Nwoko, NP 12/05/2013 10:36 AM   Other:     Other:    Other:    Other:    Other:    Other:    Other:     Scribe for Treatment Team:   Carmina MillerHorton, Lavon Horn Nicole, 12/05/2013 , 10:36 AM

## 2013-12-05 NOTE — Progress Notes (Signed)
D: Patient cooperative with staff and peers. Patient's affect is appropriate to circumstance and mood is anxious. He reported on the self inventory sheet that he's sleeping well, appetite and ability to pay attention are both good and energy level is normal. Patient voiced that he's not depressed, hopeless or suicidal; he stated, "I love myself and wouldn't want to do anything to hurt myself". He's participating in groups and visible in the milieu. Patient compliant with medication regimen.  A: Support and encouragement provided to patient. Scheduled medications administered per MD orders. Maintain Q15 minute checks for safety.  R: Patient receptive. Denies SI/HI/AVH. Patient remains safe.

## 2013-12-05 NOTE — Progress Notes (Signed)
Patient ID: Oscar Ruiz, male   DOB: 1976/10/22, 38 y.o.   MRN: [id number] Pt attended Pharmacy group and went to the gym for fitness.

## 2013-12-05 NOTE — Progress Notes (Signed)
Did not attend Group 

## 2013-12-05 NOTE — [facility] Group Notes (Signed)
[facility] LCSW Group Therapy  12/05/2013 3:38 PM  Type of Therapy:  Group Therapy  Participation Level:  Minimal  Participation Quality:  Drowsy  Affect:  Flat  Cognitive:  Lacking  Insight:  Limited  Engagement in Therapy:  Limited  Modes of Intervention:  Confrontation, Discussion, Education, Exploration, Problem-solving, Rapport Building, Socialization and Support  Summary of Progress/Problems: Emotion Regulation: This group focused on both positive and negative emotion identification and allowed group members to process ways to identify feelings, regulate negative emotions, and find healthy ways to manage internal/external emotions. Group members were asked to reflect on a time when their reaction to an emotion led to a negative outcome and explored how alternative responses using emotion regulation would have benefited them. Group members were also asked to discuss a time when emotion regulation was utilized when a negative emotion was experienced. Marcial Pacasimothy was attentive during portions of group but did not actively participate in group discussion unless directly asked a question by CSW. Marcial Pacasimothy does not appear to be making progress in the group setting at this time AEB his inability to actively participate in group discussion or remain attentive throughout group's entirety.    Smart, Cele Mote LCSWA  12/05/2013, 3:38 PM

## 2013-12-05 NOTE — Progress Notes (Signed)
Recreation Therapy Notes  Date: 01.14.2015 Time: 3:00 Location: [address] Dayroom   Group Topic: Communication, Team Building, Problem Solving  Goal Area(s) Addresses:  Patient will effectively work with peer towards shared goal.  Patient will identify skill used to make activity successful.  Patient will identify how skills used during activity can be used to reach post d/c goals.   Behavioral Response: Engaged, Attentive, Appropriate   Intervention: Problem Solving Activitiy  Activity: Life Boat. Patients were given a scenario about being on a sinking yacht. Patients were informed the yacht included 15 guest, 8 of which could be placed on the life boat, along with all group members. Individuals on guest list were of varying socioeconomic classes such as a Education officer, museumriest, Materials engineerresident Obama, MidwifeBus Driver, Tree surgeonTeacher and Chef.   Education: Pharmacist, communityocial Skills, Discharge Planning   Education Outcome: Acknowledges understanding  Clinical Observations/Feedback: Patient actively engaged in group activity, voicing his opinion and debating with peers appropriately.   At approximately 3:20pm group session was ended due to group member showing signs of seizure like behavior.   Marykay Lexenise L Selby Foisy, LRT/CTRS  Jearl KlinefelterBlanchfield, Porfirio Bollier L 12/05/2013 4:39 PM

## 2013-12-06 MED ORDER — ATORVASTATIN CALCIUM 40 MG PO TABS: 40.0000 mg | ORAL_TABLET | Freq: Every day | ORAL | Status: DC

## 2013-12-06 MED ORDER — GABAPENTIN 100 MG PO CAPS: 100.0000 mg | ORAL_CAPSULE | Freq: Three times a day (TID) | ORAL | Status: DC

## 2013-12-06 MED ORDER — SERTRALINE HCL 100 MG PO TABS: 100.0000 mg | ORAL_TABLET | Freq: Every day | ORAL | Status: DC

## 2013-12-06 MED ORDER — AMLODIPINE BESYLATE 5 MG PO TABS: 10.0000 mg | ORAL_TABLET | Freq: Every day | ORAL | Status: DC

## 2013-12-06 NOTE — Progress Notes (Signed)
Pt was discharged home today.  He denied any S/I H/I or A/V hallucinations.  He was given f/u appointment, rx, sample medications, and hotline info booklet.  He voiced understanding to all instructions provided.  He declined the need for smoking cessation materials.  He removed his nicotine patch before he left. 

## 2013-12-06 NOTE — [facility] Suicide Risk Assessment (Signed)
Suicide Risk Assessment  Discharge Assessment     Demographic Factors:  Male and Caucasian  Mental Status Per Nursing Assessment::   On Admission:     Current Mental Status by Physician: In full contact with reality. There are no suicidal ideas, plans or intent. His mood is euthymic, his affect is appropriate. There are no active S/S of withdrawal. He is motivated to pursue further outpatient follow up and accomplish  long term sobriety   Loss Factors: NA  Historical Factors: NA  Risk Reduction Factors:   Responsible for children under 37 years of age, Sense of responsibility to family, Employed, Living with another person, especially a relative and Positive social support  Continued Clinical Symptoms:  Alcohol/Substance Abuse/Dependencies  Cognitive Features That Contribute To Risk:  Closed-mindedness Polarized thinking Thought constriction (tunnel vision)    Suicide Risk:  Minimal: No identifiable suicidal ideation.  Patients presenting with no risk factors but with morbid ruminations; may be classified as minimal risk based on the severity of the depressive symptoms  Discharge Diagnoses:   AXIS I:  Alcohol Dependence, Anxiety Disorder NOS, Substance induced mood disorder AXIS II:  Deferred AXIS III:   Past Medical History  Diagnosis Date  . Hypertension   . Seizures   . Substance abuse     tobacco and alcohol  . Anxiety    AXIS IV:  other psychosocial or environmental problems AXIS V:  61-70 mild symptoms  Plan Of Care/Follow-up recommendations:  Activity:  as tolerated Diet:  regular Follow up outpatient basis Is patient on multiple antipsychotic therapies at discharge:  No   Has Patient had three or more failed trials of antipsychotic monotherapy by history:  No  Recommended Plan for Multiple Antipsychotic Therapies: NA  Gray Doering A 12/06/2013, 10:35 AM

## 2013-12-06 NOTE — Progress Notes (Signed)
The focus of this group is to educate the patient on the purpose and policies of crisis stabilization and provide a format to answer questions about their admission.  The group details unit policies and expectations of patients while admitted.   Pt attended the 0900 group this morning.  He was attentive and sharing with the group.  He was appropriate and his goal for today "leave here by 1330"

## 2013-12-06 NOTE — Progress Notes (Signed)
D: Pt is appropriate in affect and mood. Pt did not attend group this evening. Did not go into detail on why with this Clinical research associatewriter. Pt is denying any withdrawal symptoms at this time. Pt verbalizes awareness of the ending of his Librium protocol for detox. He is currently having no concerns he wishes for this writer to address at this time. Pt observed interacting appropriately amongst his peers on the unit.  A: Medication indications verbalized to pt. Scheduled medications administered to pt. Continued support and availability as needed was extended to this pt. Staff continue to monitor pt with q415min checks.  R: No adverse drug reactions noted. Pt receptive to treatment. Pt remains safe at this time.

## 2013-12-06 NOTE — Progress Notes (Signed)
[facility] :  Will you be returning to the same living situation after discharge: Yes,  returning home At discharge, do you have transportation home?:Yes,  family will pick pt up  Do you have the ability to pay for your medications:Yes,  access to meds  Release of information consent forms completed and in the chart;  Patient's signature needed at discharge.  Patient to Follow up at: Follow-up Information   [facility] 12[facility]. (Appointment scheduled on this date at 9:45 am with Dr. Ermalene SearingBedsole for medication management)    Contact information:   [address] [place], [place] [address] Phone: [phone] Fax: [phone]      Patient denies SI/HI:   Yes,  denies SI/HI    Safety Planning and Suicide Prevention discussed:  Yes,  discussed with pt, N/A to contact family/friend due to no SI on admission.  See suicide prevention education note.  Carmina MillerHorton, Kandi Brusseau Nicole 12/06/2013, 8:30 AM

## 2013-12-06 NOTE — Discharge Summary (Signed)
Physician Discharge Summary Note  Patient:  Oscar Ruiz is an 38 y.o., male MRN:  [id number] DOB:  1975-12-11 Patient phone:  [phone] (home)  Patient address:   [address] [place] [address],   Date of Admission:  12/02/2013 Date of Discharge: 12/06/12  Reason for Admission:  Alcohol detox  Discharge Diagnoses: Active Problems:   Alcohol dependence in remission  Review of Systems  Constitutional: Negative.   HENT: Negative.   Eyes: Negative.   Respiratory: Negative.   Cardiovascular: Negative.   Gastrointestinal: Negative.   Genitourinary: Negative.   Musculoskeletal: Negative.   Skin: Negative.   Neurological: Negative.   Endo/Heme/Allergies: Negative.   Psychiatric/Behavioral: Positive for depression (Stabilized with medication prior to discharge ) and substance abuse (Alcohol dependence). Negative for suicidal ideas, hallucinations and memory loss. The patient is nervous/anxious (Stabilized with medication prior to discharge) and has insomnia (Stabilized with medication prior to discharge).    DSM5: Schizophrenia Disorders:  NA Obsessive-Compulsive Disorders:  NA Trauma-Stressor Disorders:  NA Substance/Addictive Disorders:  Alcohol Related Disorder - Severe (303.90) Depressive Disorders: Substance induced mood disorder  Axis Diagnosis:   AXIS I:  Alcohol Related Disorder - Severe (303.90) AXIS II:  Deferred AXIS III:   Past Medical History  Diagnosis Date  . Hypertension   . Seizures   . Substance abuse     tobacco and alcohol  . Anxiety    AXIS IV:  other psychosocial or environmental problems and Substance dependence, chronic AXIS V:  63  Level of Care:  OP  Hospital Course:  38 Y/O male who states he drinks at least 15 beers a night. Has been drinking for 17 years and his tolerance has increased for that period of time. Occasional use of liquor on weekends. Denies any prolonged sobriety. States that he suffers high blood pressure.  States he starts panicking worrying he is going to have a stroke. This build up to having a panic attack. He is having problems with his neck and needs surgery. While he is drinking they are not going to do the surgery. He feels that his life is unmanageable. 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   After admission assessment/evaluation coupled with patient's reports of increased alcohol consumption, Oscar Ruiz was ordered and received Librium detoxification treatment protocols to help re-stabilize his systems of alcohol intoxication and to combat the withdrawal symptoms as well. He was also enrolled in group counseling sessions and activities to lean coping skills that should help him cope better and manage his substance abuse issues after discharge. Oscar Ruiz participated in the AA/NA meetings being offered and held on this unit.   Besides the detoxification treatment protocols, Oscar Ruiz has underlying depressive and anxiety disorder issues that required medication management for stabilization. He received Sertraline 100 mg daily for depression and Gabapentin 100 mg three times daily for substance withdrawal syndrome. He also received medication management and monitoring for the other medical issues that he presented. He tolerated his treatment regimen without any significant adverse effects and or reactions reported.  Oscar Ruiz attended treatment team meeting this am this am and met with the treatment team staff. His reason for admission, response to treatment regimen and discharge plans discussed. Patient endorsed that his symptoms has improved and mood stabilized. He says he is ready for a discharge for follow-up care with his primary care physician at the [facility] in [place] on 12/07/13 at 09:45 am with Dr. Valorie Roosevelt. The address, date, time  and contact information for this clinic provided for patient in writing.  Upon discharge, Oscar Ruiz adamantly denies any SIHI,  AVH, delusions, paranoia and or withdrawal symptoms of alcohol. He was provided with 4 days worth supply samples of his [facility] discharge medications. He left [facility] with all personal belongings in no apparent distress. Transportation per family.     Consults:  psychiatry  Significant Diagnostic Studies:  labs: CBC with diff, CMP, UDS, toxicology tests, U/A  Discharge Vitals:   Blood pressure 131/87, pulse 88, temperature 98.2 F (36.8 C), temperature source Oral, resp. rate 20, height '5\' 8"'  (1.727 m), weight 92.534 kg (204 lb). Body mass index is 31.03 kg/(m^2). Lab Results:   No results found for this or any previous visit (from the past 72 hour(s)).  Physical Findings: AIMS: Facial and Oral Movements Muscles of Facial Expression: None, normal Lips and Perioral Area: None, normal Jaw: None, normal Tongue: None, normal,Extremity Movements Upper (arms, wrists, hands, fingers): None, normal Lower (legs, knees, ankles, toes): None, normal, Trunk Movements Neck, shoulders, hips: None, normal, Overall Severity Severity of abnormal movements (highest score from questions above): None, normal Incapacitation due to abnormal movements: None, normal Patient's awareness of abnormal movements (rate only patient's report): No Awareness, Dental Status Current problems with teeth and/or dentures?: No Does patient usually wear dentures?: No  CIWA:  CIWA-Ar Total: 1 COWS:     Psychiatric Specialty Exam: See Psychiatric Specialty Exam and Suicide Risk Assessment completed by Attending Physician prior to discharge.  Discharge destination:  Home  Is patient on multiple antipsychotic therapies at discharge:  No   Has Patient had three or more failed trials of antipsychotic monotherapy by history:  No  Recommended Plan for Multiple Antipsychotic Therapies: NA       Future Appointments Provider Department Dept Phone   12/21/2013 8:45 AM Amy Cletis [place], MD [facility]        Medication List    STOP taking these medications       clonazePAM 0.5 MG tablet  Commonly known as:  KLONOPIN      TAKE these medications     Indication   amLODipine 5 MG tablet  Commonly known as:  NORVASC  Take 2 tablets (10 mg total) by mouth daily. For hypertesion   Indication:  High Blood Pressure     atorvastatin 40 MG tablet  Commonly known as:  LIPITOR  Take 1 tablet (40 mg total) by mouth daily. For cholesterol control   Indication:  Inherited Heterozygous Hypercholesterolemia, Nonfamilial Heterozygous Hypercholesterolemia     gabapentin 100 MG capsule  Commonly known as:  NEURONTIN  Take 1 capsule (100 mg total) by mouth 3 (three) times daily. For alcohol withdrawal related anxiety   Indication:  Alcohol Withdrawal Syndrome     sertraline 100 MG tablet  Commonly known as:  ZOLOFT  Take 1 tablet (100 mg total) by mouth at bedtime. For depression   Indication:  Major Depressive Disorder       Follow-up Information   Follow up with [facility] On 12/07/2013. (Appointment scheduled on this date at 9:45 am with Dr. Diona Browner for medication management)    Contact information:   [address] [place], [place] [address] Phone: [phone] Fax: [phone]     Follow-up recommendations: Activity:  As tolerated Diet: As recommended by your primary care doctor. Keep all scheduled follow-up appointments as recommended.   Continue to work your relapse prevention plan Comments: Take all  your medications as prescribed by your mental healthcare provider. Report any adverse effects and or reactions from your medicines to your outpatient provider promptly. Patient is instructed and cautioned to not engage in alcohol and or illegal drug use while on prescription medicines. In the event of worsening symptoms, patient is instructed to call the crisis hotline, 911 and or go to the nearest ED for appropriate evaluation and treatment of  symptoms. Follow-up with your primary care provider for your other medical issues, concerns and or health care needs.    Total Discharge Time:  Greater than 30 minutes.  Signed: Encarnacion Slates, PMHNP-BC 12/07/2013, 3:38 PM Agree with assessment and plan Geralyn Flash A. Sabra Heck, M.D.

## 2013-12-07 ENCOUNTER — Ambulatory Visit (INDEPENDENT_AMBULATORY_CARE_PROVIDER_SITE_OTHER): Payer: 59 | Admitting: Family Medicine

## 2013-12-07 ENCOUNTER — Encounter: Payer: Self-pay | Admitting: Family Medicine

## 2013-12-07 VITALS — BP 130/80 | HR 99 | Temp 97.8°F | Ht 68.0 in | Wt 217.2 lb

## 2013-12-07 DIAGNOSIS — F17200 Nicotine dependence, unspecified, uncomplicated: Secondary | ICD-10-CM

## 2013-12-07 DIAGNOSIS — M5010 Cervical disc disorder with radiculopathy, unspecified cervical region: Secondary | ICD-10-CM

## 2013-12-07 DIAGNOSIS — I10 Essential (primary) hypertension: Secondary | ICD-10-CM

## 2013-12-07 DIAGNOSIS — F419 Anxiety disorder, unspecified: Secondary | ICD-10-CM

## 2013-12-07 DIAGNOSIS — M5412 Radiculopathy, cervical region: Secondary | ICD-10-CM

## 2013-12-07 DIAGNOSIS — F411 Generalized anxiety disorder: Secondary | ICD-10-CM

## 2013-12-07 DIAGNOSIS — F1020 Alcohol dependence, uncomplicated: Secondary | ICD-10-CM

## 2013-12-07 MED ORDER — NICOTINE 21 MG/24HR TD PT24
21.0000 mg | MEDICATED_PATCH | Freq: Every day | TRANSDERMAL | Status: DC
Start: 2013-12-07 — End: 2014-03-14

## 2013-12-07 NOTE — Progress Notes (Signed)
Pre-visit discussion using our clinic review tool. No additional management support is needed unless otherwise documented below in the visit note.  

## 2013-12-07 NOTE — Progress Notes (Signed)
   Subjective:    Patient ID: Oscar Ruiz, male    DOB: 04/05/1976, 38 y.o.   MRN: [id number]  HPI  38 year old male presents for 1 week follow up .   At last appointment I recommended hospital admission for alcohol detox. He was admitted on 1/11 and discharged on 1/15.  HTN: His blood pressure is much improved now on amlodipine 5 mg daily.   he no longer has pressure in head, no jaw pain.  Anxiety:   He report much improved control of anxiety.He is on sertraline 100 mg daily. ( BAck on previous dose from recent increase to 150 mg daily) He was also weaned off the benzodiazepine.  His wife is his sponsor. He has pamphlet for AA.  He was exposed to sick individuals. In last 2 days he has started coughing. No fever, no body aches. No longer smoking.  Looking into nicotine patches.    Review of Systems  Constitutional: Negative for fever and fatigue.  HENT: Negative for ear pain.   Eyes: Negative for pain.       Objective:   Physical Exam  Constitutional: Vital signs are normal. He appears well-developed and well-nourished.  HENT:  Head: Normocephalic.  Right Ear: Hearing normal.  Left Ear: Hearing normal.  Nose: Nose normal.  Mouth/Throat: Oropharynx is clear and moist and mucous membranes are normal.  Neck: Trachea normal. Carotid bruit is not present. No mass and no thyromegaly present.  Cardiovascular: Normal rate, regular rhythm and normal pulses.  Exam reveals no gallop, no distant heart sounds and no friction rub.   No murmur heard. No peripheral edema  Pulmonary/Chest: Effort normal and breath sounds normal. No respiratory distress.  Skin: Skin is warm, dry and intact. No rash noted.  Psychiatric: He has a normal mood and affect. His speech is normal and behavior is normal. Thought content normal.          Assessment & Plan:

## 2013-12-07 NOTE — Assessment & Plan Note (Signed)
Improved control off alcohol and on amlodipine.  Clonidine was likely causing rebound hypertension.

## 2013-12-07 NOTE — Assessment & Plan Note (Signed)
Improved off klonopin and on sertraline at lower dose.

## 2013-12-07 NOTE — Assessment & Plan Note (Signed)
Successfully went through detox.  Refuses AA, but his wife is his sponsor. He will go if she recommends it. Continue gabapentin for withdrawal for at least 30 days then consider decreasing dose. Close follow up in 2 weeks

## 2013-12-07 NOTE — Assessment & Plan Note (Signed)
Has upcoming follow up with neuro surgeon.  Neck pain continues. Still having numbness when turning head on top of right scalp.

## 2013-12-07 NOTE — Assessment & Plan Note (Signed)
Prescribed nicotine patches.

## 2013-12-07 NOTE — Patient Instructions (Addendum)
Look into nicotine patches and starting.   Continue gabapentin three times a day. Can use mucinex DM for twice daily for cough. Call if no improving in 5-7 days.  Follow up in 2 weeks. Follow BP at home.

## 2013-12-08 ENCOUNTER — Telehealth: Payer: Self-pay | Admitting: Family Medicine

## 2013-12-08 NOTE — Telephone Encounter (Signed)
Relevant patient education assigned to patient using Emmi. ° °

## 2013-12-11 NOTE — Progress Notes (Signed)
Patient Discharge Instructions:  Next Level Care Provider Has Access to the EMR, 12/11/13 Records provided to [facility] via [facility]/Epic access.  Jerelene ReddenSheena E [employer], 12/11/2013, 3:37 PM

## 2013-12-11 NOTE — Telephone Encounter (Signed)
Encounter opened in error

## 2013-12-21 ENCOUNTER — Ambulatory Visit (INDEPENDENT_AMBULATORY_CARE_PROVIDER_SITE_OTHER): Payer: 59 | Admitting: Family Medicine

## 2013-12-21 ENCOUNTER — Encounter: Payer: Self-pay | Admitting: Family Medicine

## 2013-12-21 VITALS — BP 128/80 | HR 94 | Temp 98.0°F | Ht 68.0 in | Wt 213.8 lb

## 2013-12-21 DIAGNOSIS — Z01818 Encounter for other preprocedural examination: Secondary | ICD-10-CM

## 2013-12-21 DIAGNOSIS — F411 Generalized anxiety disorder: Secondary | ICD-10-CM

## 2013-12-21 DIAGNOSIS — I10 Essential (primary) hypertension: Secondary | ICD-10-CM

## 2013-12-21 DIAGNOSIS — F1021 Alcohol dependence, in remission: Secondary | ICD-10-CM

## 2013-12-21 DIAGNOSIS — F17200 Nicotine dependence, unspecified, uncomplicated: Secondary | ICD-10-CM

## 2013-12-21 LAB — PROTIME-INR
INR: 1 ratio (ref 0.8–1.0)
PROTHROMBIN TIME: 10.9 s (ref 10.2–12.4)

## 2013-12-21 LAB — APTT: APTT: 29 s — AB (ref 21.7–28.8)

## 2013-12-21 MED ORDER — AMLODIPINE BESYLATE 10 MG PO TABS: 10.0000 mg | ORAL_TABLET | Freq: Every day | ORAL | Status: DC

## 2013-12-21 MED ORDER — SERTRALINE HCL 100 MG PO TABS: 100.0000 mg | ORAL_TABLET | Freq: Every day | ORAL | Status: DC

## 2013-12-21 NOTE — Assessment & Plan Note (Signed)
Well controlled here... Borderline on home cuff but improving further he gets from detox.  Follow up in 1 month... May need to add med if not at goal at that time.  Encouraged exercise, weight loss, healthy eating habits.

## 2013-12-21 NOTE — Progress Notes (Signed)
   Subjective:    Patient ID: Oscar Ruiz, male    DOB: Jun 11, 1976, [age] y.o.   MRN: [id number]  HPI  38 year old male presents for 2 week follow up.   He has remained off alcohol since detox and behavioral admission.  Anxiety, panic attacks: stable on sertraline.  Hypertension:  Well controlled here NOT AT HOME on amlodipine 10 mg daily BP Readings from Last 3 Encounters:  12/21/13 128/80  12/07/13 130/80  12/06/13 131/87  Using medication without problems or lightheadedness:  None Chest pain with exertion:None Edema:None Short of breath:None Average home BPs: Other issues: Still having sensation of numbness on right poterior head.Marland Kitchen. Resolves with moving neck.  Smoking: insurance has enrolled him in quitting program and will send him patches for free. Wife plans to quit at the same time.   He is going to have cervical spine surgery in March. Need to have labs done... Has cbc and BMET, needs PT PTT.  Review of Systems  Constitutional: Negative for fever and fatigue.  HENT: Negative for ear pain.   Eyes: Negative for pain.  Respiratory: Negative for cough and shortness of breath.   Cardiovascular: Negative for chest pain.  Gastrointestinal: Negative for abdominal pain.       Objective:   Physical Exam  Constitutional: Vital signs are normal. He appears well-developed and well-nourished.  HENT:  Head: Normocephalic.  Right Ear: Hearing normal.  Left Ear: Hearing normal.  Nose: Nose normal.  Mouth/Throat: Oropharynx is clear and moist and mucous membranes are normal.  Neck: Trachea normal. Carotid bruit is not present. No mass and no thyromegaly present.  Cardiovascular: Normal rate, regular rhythm and normal pulses.  Exam reveals no gallop, no distant heart sounds and no friction rub.   No murmur heard. No peripheral edema  Pulmonary/Chest: Effort normal and breath sounds normal. No respiratory distress.  Skin: Skin is warm, dry and intact. No rash noted.    Psychiatric: He has a normal mood and affect. His speech is normal and behavior is normal. Thought content normal.          Assessment & Plan:

## 2013-12-21 NOTE — Assessment & Plan Note (Signed)
Stable control on sertraline  

## 2013-12-21 NOTE — Assessment & Plan Note (Signed)
Smoking cessation instruction/counseling given:  counseled patient on the dangers of tobacco use, advised patient to stop smoking, and reviewed strategies to maximize success. He plans on starting patches when they arrive.

## 2013-12-21 NOTE — Patient Instructions (Addendum)
Stop lab on way out. We will fax results when they are back to neck surgeon. Follow up HTN in 1 month. Work on low salt diet, weight loss, start exercise.

## 2013-12-21 NOTE — Progress Notes (Signed)
Pre-visit discussion using our clinic review tool. No additional management support is needed unless otherwise documented below in the visit note.  

## 2013-12-21 NOTE — Assessment & Plan Note (Signed)
He has successfully remained off alcohol for 1 month now. He is weaning off gabapentin.

## 2013-12-21 NOTE — Addendum Note (Signed)
Addended by: Alvina ChouWALSH, TERRI J on: 12/21/2013 09:40 AM   Modules accepted: Orders

## 2014-03-11 ENCOUNTER — Telehealth: Payer: Self-pay

## 2014-03-11 NOTE — Telephone Encounter (Signed)
This can wait until the AM with Dr. Leonard SchwartzB.

## 2014-03-11 NOTE — Telephone Encounter (Signed)
Called and advised Oscar Ruiz not to restart Gabapentin until he discuss this with Dr. Ermalene SearingBedsole at this appointment tomorrow.  Oscar Ruiz states that when he was doing the Sertraline 50 mg two tabs at bedtime he did fine but once his changed to the Sertraline 100 mg one tab daily is when he started feeling anxious again.  Advised that we could always change him back to the Sertraline 50 mg but will address this also at his appointment tomorrow.

## 2014-03-11 NOTE — Telephone Encounter (Signed)
Pt said was taking Sertraline 50 mg two tabs at bedtime; when pt changed to new rx filled for Sertraline 100 mg taking one tab at bedtime pt said he has started to feel anxious again. Pt said he has not drank alcohol since 12/02/13 and does not think that was cause anxiousness now. Pt has not taken Gabapentin 100 mg since mid March. Pt wants to know if should restart Gabapentin until pt can be seen 03/12/14 at 1:45 pm by Dr Ermalene SearingBedsole. Pt took last Sertraline 100 mg tab on 03/09/14 and pt still has anxiety feeling. No SI/HI. [facility]. Pt request cb.

## 2014-03-12 ENCOUNTER — Ambulatory Visit: Payer: Self-pay | Admitting: Family Medicine

## 2014-03-12 DIAGNOSIS — Z0289 Encounter for other administrative examinations: Secondary | ICD-10-CM

## 2014-03-12 NOTE — Telephone Encounter (Signed)
Noted  

## 2014-03-14 ENCOUNTER — Ambulatory Visit (INDEPENDENT_AMBULATORY_CARE_PROVIDER_SITE_OTHER): Payer: 59 | Admitting: Family Medicine

## 2014-03-14 ENCOUNTER — Encounter: Payer: Self-pay | Admitting: Family Medicine

## 2014-03-14 VITALS — BP 124/82 | HR 82 | Temp 97.7°F | Ht 68.0 in | Wt 215.0 lb

## 2014-03-14 DIAGNOSIS — R569 Unspecified convulsions: Secondary | ICD-10-CM

## 2014-03-14 DIAGNOSIS — F411 Generalized anxiety disorder: Secondary | ICD-10-CM

## 2014-03-14 DIAGNOSIS — F445 Conversion disorder with seizures or convulsions: Secondary | ICD-10-CM

## 2014-03-14 DIAGNOSIS — F410 Panic disorder [episodic paroxysmal anxiety] without agoraphobia: Secondary | ICD-10-CM

## 2014-03-14 DIAGNOSIS — E785 Hyperlipidemia, unspecified: Secondary | ICD-10-CM

## 2014-03-14 DIAGNOSIS — I10 Essential (primary) hypertension: Secondary | ICD-10-CM

## 2014-03-14 LAB — LIPID PANEL
Cholesterol: 229 mg/dL — ABNORMAL HIGH (ref 0–200)
HDL: 32.3 mg/dL — ABNORMAL LOW (ref >39.00)
LDL Cholesterol: 148 mg/dL — ABNORMAL HIGH (ref 0–99)
Total CHOL/HDL Ratio: 7
Triglycerides: 245 mg/dL — ABNORMAL HIGH (ref 0.0–149.0)
VLDL: 49 mg/dL — ABNORMAL HIGH (ref 0.0–40.0)

## 2014-03-14 MED ORDER — ATORVASTATIN CALCIUM 40 MG PO TABS: 40.0000 mg | ORAL_TABLET | Freq: Every day | ORAL | Status: DC

## 2014-03-14 MED ORDER — VENLAFAXINE HCL ER 37.5 MG PO TB24: 37.5000 mg | ORAL_TABLET | Freq: Every day | ORAL | Status: DC

## 2014-03-14 MED ORDER — CYCLOBENZAPRINE HCL 10 MG PO TABS: 10.0000 mg | ORAL_TABLET | Freq: Every evening | ORAL | Status: DC | PRN

## 2014-03-14 NOTE — Addendum Note (Signed)
Addended by: Damita LackLORING, Caleah Tortorelli S on: 03/14/2014 05:07 PM   Modules accepted: Orders

## 2014-03-14 NOTE — Assessment & Plan Note (Signed)
Change to effexor. Stop sertraline.   Can use gabapentin for anxiety symptoms.  Follow up in 1 month.

## 2014-03-14 NOTE — Assessment & Plan Note (Signed)
Due for re-eval. Not on statin.

## 2014-03-14 NOTE — Progress Notes (Signed)
Pre visit review using our clinic review tool, if applicable. No additional management support is needed unless otherwise documented below in the visit note. 

## 2014-03-14 NOTE — Progress Notes (Signed)
   Subjective:    Patient ID: Oscar Ruiz, male    DOB: 06/26/1976, 38 y.o.   MRN: [id number]  Anxiety Symptoms include chest pain, nervous/anxious behavior, palpitations and shortness of breath. Patient reports no suicidal ideas.       38 year old male with history of alcohol abuse in remission and gen anxiety, panic attacks presents with worsened anxiety symptoms in last 2 week.  He has been having panic attacks every day in last 2 weeks. Feels like he stops breathing, then breathing fast, cannot focus, pressure in head.  Wife describes he is agitated easily, irritable.  No insomnia. No depression, Decreased appetite.  No specific triggers other than he changed to going to 100 mg tab instead of 2 x 50 mg tabs.  Also he has more stress at work with increased responsibilities.  He stopped sertraline entirely 4 days ago.  He has stayed away from alcohol since 11/2013.\   He has upcoming surgery on neck.. Planned in 06/2014.  requested refill of flexeril.  Has been working on lfiestyle change. HAs not used liptior.  Wishes to recheck chol.   Wt Readings from Last 3 Encounters:  03/14/14 215 lb (97.523 kg)  12/21/13 213 lb 12 oz (96.956 kg)  12/07/13 217 lb 4 oz (98.544 kg)     Bp well controlled on amlodipine. BP Readings from Last 3 Encounters:  03/14/14 124/82  12/21/13 128/80  12/07/13 130/80     Review of Systems  Constitutional: Negative for fever and fatigue.  HENT: Negative for ear pain.   Eyes: Negative for pain.  Respiratory: Positive for shortness of breath. Negative for cough and wheezing.   Cardiovascular: Positive for chest pain and palpitations. Negative for leg swelling.  Gastrointestinal: Negative for abdominal pain.  Psychiatric/Behavioral: Positive for agitation. Negative for suicidal ideas, sleep disturbance and dysphoric mood. The patient is nervous/anxious.        Objective:   Physical Exam  Constitutional: Vital signs are normal. He  appears well-developed and well-nourished.  HENT:  Head: Normocephalic.  Right Ear: Hearing normal.  Left Ear: Hearing normal.  Nose: Nose normal.  Mouth/Throat: Oropharynx is clear and moist and mucous membranes are normal.  Neck: Trachea normal. Carotid bruit is not present. No mass and no thyromegaly present.  Cardiovascular: Normal rate, regular rhythm and normal pulses.  Exam reveals no gallop, no distant heart sounds and no friction rub.   No murmur heard. No peripheral edema  Pulmonary/Chest: Effort normal and breath sounds normal. No respiratory distress.  Skin: Skin is warm, dry and intact. No rash noted.  Psychiatric: His speech is normal. Thought content normal. His mood appears anxious. He is agitated.          Assessment & Plan:

## 2014-03-14 NOTE — Assessment & Plan Note (Signed)
Well-controlled on current meds 

## 2014-03-14 NOTE — Patient Instructions (Addendum)
Start venlfaxine one daily , if tolerating increase to 2 tabs daily after week. Can use gabapentin as needed for anxiety symptoms . Can use flexeril for muscle strain. Follow up in 1 month.

## 2014-03-18 ENCOUNTER — Telehealth: Payer: Self-pay | Admitting: *Deleted

## 2014-03-18 NOTE — Telephone Encounter (Signed)
Received fax from [facility] [address]  Venlafaxine ER 37.5 mg  is not covered by patient's insurance.  Requesting Dr. Ermalene SearingBedsole send in new prescription for something else.

## 2014-03-18 NOTE — Telephone Encounter (Signed)
Please call pharm, do they cover the short acting velafaxine  for him to take q12 hours?

## 2014-03-19 MED ORDER — VENLAFAXINE HCL 37.5 MG PO TABS: 37.5000 mg | ORAL_TABLET | Freq: Two times a day (BID) | ORAL | Status: DC

## 2014-03-19 NOTE — Telephone Encounter (Signed)
Spoke [facility] at [facility]  She states it does look like his insurance will cover the short acting venlafaxine.

## 2014-03-19 NOTE — Telephone Encounter (Signed)
Let pt know med cahnges to twice daily venlafaxine. Increase to 2 tabs twice daily if tolerating after 1 week.

## 2014-03-19 NOTE — Telephone Encounter (Addendum)
Patient notified as instructed by telephone..  Mr. Oscar Ruiz ask if he should take the gabapentin as well as the venlafaxine.  Per Dr. Ermalene SearingBedsole, he should take both medications.

## 2014-03-22 ENCOUNTER — Other Ambulatory Visit: Payer: Self-pay | Admitting: Family Medicine

## 2014-03-22 NOTE — Telephone Encounter (Signed)
Last office visit 03/14/2014.  Ok to refill? 

## 2014-04-10 ENCOUNTER — Other Ambulatory Visit: Payer: Self-pay | Admitting: Family Medicine

## 2014-04-11 ENCOUNTER — Other Ambulatory Visit: Payer: Self-pay | Admitting: Family Medicine

## 2014-04-11 NOTE — Telephone Encounter (Signed)
Last office visit 03/14/2014.  Last filled 03/14/2014 for #15.  Ok to refill?

## 2014-04-19 ENCOUNTER — Ambulatory Visit: Payer: Self-pay | Admitting: Family Medicine

## 2014-04-25 ENCOUNTER — Other Ambulatory Visit: Payer: Self-pay | Admitting: Family Medicine

## 2014-04-26 NOTE — Telephone Encounter (Signed)
Last office visit 03/14/2014.  Last refilled 04/11/2014 for #15.  Ok to refill?

## 2014-05-01 ENCOUNTER — Other Ambulatory Visit: Payer: Self-pay | Admitting: Family Medicine

## 2014-05-01 NOTE — Telephone Encounter (Signed)
Last filled 03/22/14 #90

## 2014-05-04 ENCOUNTER — Other Ambulatory Visit: Payer: Self-pay | Admitting: Family Medicine

## 2014-05-21 ENCOUNTER — Other Ambulatory Visit: Payer: Self-pay | Admitting: Family Medicine

## 2014-05-21 NOTE — Telephone Encounter (Signed)
Last office visit 03/14/2014.   Last refilled  04/26/2014 for #15.  Ok to refill?

## 2014-06-04 ENCOUNTER — Other Ambulatory Visit: Payer: Self-pay | Admitting: Family Medicine

## 2014-06-04 NOTE — Telephone Encounter (Signed)
Last office visit 03/14/2014.  Last refilled 05/21/2014 for #15 with no refills.  Ok to refill?

## 2014-06-04 NOTE — Telephone Encounter (Signed)
Over due for follow up. No refills until seen.

## 2014-06-17 ENCOUNTER — Other Ambulatory Visit: Payer: Self-pay | Admitting: Family Medicine

## 2014-06-20 ENCOUNTER — Other Ambulatory Visit: Payer: Self-pay | Admitting: *Deleted

## 2014-06-20 MED ORDER — VENLAFAXINE HCL 37.5 MG PO TABS: ORAL_TABLET | ORAL | Status: DC

## 2014-07-18 ENCOUNTER — Encounter: Payer: Self-pay | Admitting: Family Medicine

## 2014-07-18 ENCOUNTER — Ambulatory Visit (INDEPENDENT_AMBULATORY_CARE_PROVIDER_SITE_OTHER): Payer: 59 | Admitting: Family Medicine

## 2014-07-18 VITALS — BP 124/80 | HR 112 | Temp 98.2°F | Ht 68.0 in | Wt 202.8 lb

## 2014-07-18 DIAGNOSIS — F411 Generalized anxiety disorder: Secondary | ICD-10-CM

## 2014-07-18 DIAGNOSIS — F1021 Alcohol dependence, in remission: Secondary | ICD-10-CM

## 2014-07-18 DIAGNOSIS — F410 Panic disorder [episodic paroxysmal anxiety] without agoraphobia: Secondary | ICD-10-CM

## 2014-07-18 DIAGNOSIS — I10 Essential (primary) hypertension: Secondary | ICD-10-CM

## 2014-07-18 MED ORDER — HYDROCHLOROTHIAZIDE 25 MG PO TABS: 25.0000 mg | ORAL_TABLET | Freq: Every day | ORAL | Status: DC

## 2014-07-18 MED ORDER — BUPROPION HCL ER (XL) 150 MG PO TB24: 150.0000 mg | ORAL_TABLET | Freq: Every day | ORAL | Status: DC

## 2014-07-18 NOTE — Patient Instructions (Addendum)
Stop venlafaxine.  Stay away from alcohol. Start wellbutrin  XL150 mg daily. Continue amlodipine. Start HCTZ. Follow BP at home  Goal < 150/90.  Follow up in 2 weeks. Will do fasting labs at next appt not prior.

## 2014-07-18 NOTE — Progress Notes (Signed)
   Subjective:    Patient ID: Oscar Ruiz, male    DOB: 1975-12-16, 38 y.o.   MRN: [id number]  HPI 38 year old male presents to discuss his anxiety. He has been noting tunnel vision when he is driving, palpations, feel panicky.  Small triggers sets him off with panic. Has flushing feels intermittently. Having daily issues. Intermittent insomnia given recent neck surgery and wearing collar. Denies depression. Trouble thinking clearly. No clear sign of  He has restarted drinking some .Marland Kitchen Had 5 beers earlier in week, none since.  He had neck surgery on 07/02/2014.  He is currently taking venlafaxine 37.5 mg daily. He noted he had difficulty concentrating on higher dose.  Hypertension:  On amlodipine 10 mg daily BP Readings from Last 3 Encounters:  07/18/14 124/80  03/14/14 124/82  12/21/13 128/80  Using medication without problems or lightheadedness:  Headache, throbbing in ear and rinnig when BP up. Chest pain with exertion:None Edema:None Short of breath: None Average home BPs: 135-145/94-96 Other issues:      Review of Systems  Constitutional: Negative for fever and fatigue.  Respiratory: Negative for cough and shortness of breath.   Cardiovascular: Positive for chest pain and palpitations.  Psychiatric/Behavioral: Positive for behavioral problems and agitation. Negative for suicidal ideas and self-injury. The patient is nervous/anxious and is hyperactive.        Objective:   Physical Exam  Constitutional: Vital signs are normal. He appears well-developed and well-nourished.  HENT:  Head: Normocephalic.  Right Ear: Hearing normal.  Left Ear: Hearing normal.  Nose: Nose normal.  Mouth/Throat: Oropharynx is clear and moist and mucous membranes are normal.  Neck: Trachea normal. Carotid bruit is not present. No mass and no thyromegaly present.  Cardiovascular: Regular rhythm and normal pulses.  Tachycardia present.  Exam reveals no gallop, no distant heart  sounds and no friction rub.   No murmur heard. No peripheral edema  Pulmonary/Chest: Effort normal and breath sounds normal. No respiratory distress.  Skin: Skin is warm, dry and intact. No rash noted.  Psychiatric: Thought content normal. His mood appears anxious. His speech is rapid and/or pressured. He is agitated. Cognition and memory are normal. He expresses impulsivity.          Assessment & Plan:

## 2014-07-18 NOTE — Progress Notes (Signed)
Pre visit review using our clinic review tool, if applicable. No additional management support is needed unless otherwise documented below in the visit note. 

## 2014-07-19 ENCOUNTER — Telehealth: Payer: Self-pay

## 2014-07-19 NOTE — Telephone Encounter (Signed)
Pt was seen 07/18/14; pt read literature with wellbutrin and HCTZ; Wellbutrin info said not to drink and take med; pt wants to know if he can drink occasional beer with wellbutrin and does he have to limit his intake of fluids including water since taking HCTZ. Pt request cb today.

## 2014-07-19 NOTE — Telephone Encounter (Signed)
Oscar Ruiz notified as instructed by telephone.  He wanted to know if it would be okay if he drank a couple of beers while taking the Wellbutrin. Reiterated that Dr. Ermalene Searing did not recommend any alcohol intake not because of the medication but because of his history.  He again ask if he could drink beer while taking Wellbutrin.  Informed alcohol was not a contraindication with Wellbutrin so it would not hurt him if he did drink beer with this medication.

## 2014-07-19 NOTE — Telephone Encounter (Signed)
I do not recommend any alcohol not because of the medication but because of his history.  He does not need to limit fluids.

## 2014-07-26 ENCOUNTER — Telehealth: Payer: Self-pay

## 2014-07-26 ENCOUNTER — Emergency Department (HOSPITAL_COMMUNITY)
Admission: EM | Admit: 2014-07-26 | Discharge: 2014-07-26 | Disposition: A | Discharge status: Home / Self Care | Payer: 59 | Attending: Emergency Medicine | Admitting: Emergency Medicine

## 2014-07-26 ENCOUNTER — Encounter (HOSPITAL_COMMUNITY): Payer: Self-pay | Admitting: Emergency Medicine

## 2014-07-26 DIAGNOSIS — F17200 Nicotine dependence, unspecified, uncomplicated: Secondary | ICD-10-CM | POA: Insufficient documentation

## 2014-07-26 DIAGNOSIS — R002 Palpitations: Secondary | ICD-10-CM | POA: Diagnosis present

## 2014-07-26 DIAGNOSIS — F411 Generalized anxiety disorder: Secondary | ICD-10-CM | POA: Diagnosis not present

## 2014-07-26 DIAGNOSIS — R000 Tachycardia, unspecified: Secondary | ICD-10-CM | POA: Diagnosis not present

## 2014-07-26 DIAGNOSIS — Z79899 Other long term (current) drug therapy: Secondary | ICD-10-CM | POA: Diagnosis not present

## 2014-07-26 DIAGNOSIS — I10 Essential (primary) hypertension: Secondary | ICD-10-CM | POA: Diagnosis not present

## 2014-07-26 DIAGNOSIS — F419 Anxiety disorder, unspecified: Secondary | ICD-10-CM

## 2014-07-26 LAB — BASIC METABOLIC PANEL
ANION GAP: 17 — AB (ref 5–15)
BUN: 8 mg/dL (ref 6–23)
CALCIUM: 10.2 mg/dL (ref 8.4–10.5)
CHLORIDE: 93 meq/L — AB (ref 96–112)
CO2: 29 mEq/L (ref 19–32)
CREATININE: 1.06 mg/dL (ref 0.50–1.35)
GFR calc Af Amer: >90 mL/min (ref >90)
GFR calc non Af Amer: 88 mL/min — ABNORMAL LOW (ref >90)
GLUCOSE: 150 mg/dL — AB (ref 70–99)
Potassium: 3.1 mEq/L — ABNORMAL LOW (ref 3.7–5.3)
Sodium: 139 mEq/L (ref 137–147)

## 2014-07-26 LAB — I-STAT TROPONIN, ED: Troponin i, poc: 0 ng/mL (ref 0.00–0.08)

## 2014-07-26 LAB — CBC WITH DIFFERENTIAL/PLATELET
Basophils Absolute: 0 10*3/uL (ref 0.0–0.1)
Basophils Relative: 0 % (ref 0–1)
Eosinophils Absolute: 0.1 10*3/uL (ref 0.0–0.7)
Eosinophils Relative: 0 % (ref 0–5)
HCT: 44.9 % (ref 39.0–52.0)
Hemoglobin: 16.3 g/dL (ref 13.0–17.0)
LYMPHS PCT: 16 % (ref 12–46)
Lymphs Abs: 2.4 10*3/uL (ref 0.7–4.0)
MCH: 30.7 pg (ref 26.0–34.0)
MCHC: 36.3 g/dL — ABNORMAL HIGH (ref 30.0–36.0)
MCV: 84.6 fL (ref 78.0–100.0)
Monocytes Absolute: 0.6 10*3/uL (ref 0.1–1.0)
Monocytes Relative: 4 % (ref 3–12)
NEUTROS PCT: 80 % — AB (ref 43–77)
Neutro Abs: 12.6 10*3/uL — ABNORMAL HIGH (ref 1.7–7.7)
PLATELETS: 191 10*3/uL (ref 150–400)
RBC: 5.31 MIL/uL (ref 4.22–5.81)
RDW: 12.5 % (ref 11.5–15.5)
WBC: 15.7 10*3/uL — AB (ref 4.0–10.5)

## 2014-07-26 MED ORDER — LORAZEPAM 2 MG/ML IJ SOLN
1.0000 mg | Freq: Once | INTRAMUSCULAR | Status: AC
  Administered 2014-07-26: 1 mg via INTRAVENOUS
  Filled 2014-07-26: qty 1

## 2014-07-26 MED ORDER — POTASSIUM CHLORIDE CRYS ER 20 MEQ PO TBCR
40.0000 meq | EXTENDED_RELEASE_TABLET | Freq: Once | ORAL | Status: AC
  Administered 2014-07-26: 40 meq via ORAL
  Filled 2014-07-26: qty 2

## 2014-07-26 MED ORDER — LORAZEPAM 1 MG PO TABS: 1.0000 mg | ORAL_TABLET | Freq: Once | ORAL | Status: DC

## 2014-07-26 MED ORDER — LORAZEPAM 1 MG PO TABS: 1.0000 mg | ORAL_TABLET | Freq: Three times a day (TID) | ORAL | Status: DC | PRN

## 2014-07-26 NOTE — Discharge Instructions (Signed)
Take ativan as prescribed as needed for anxiety. Please follow up with your doctor for further evaluation and treatment.  Panic Attacks Panic attacks are sudden, short-livedsurges of severe anxiety, fear, or discomfort. They may occur for no reason when you are relaxed, when you are anxious, or when you are sleeping. Panic attacks may occur for a number of reasons:   Healthy people occasionally have panic attacks in extreme, life-threatening situations, such as war or natural disasters. Normal anxiety is a protective mechanism of the body that helps [place] react to danger (fight or flight response).  Panic attacks are often seen with anxiety disorders, such as panic disorder, social anxiety disorder, generalized anxiety disorder, and phobias. Anxiety disorders cause excessive or uncontrollable anxiety. They may interfere with your relationships or other life activities.  Panic attacks are sometimes seen with other mental illnesses, such as depression and posttraumatic stress disorder.  Certain medical conditions, prescription medicines, and drugs of abuse can cause panic attacks. SYMPTOMS  Panic attacks start suddenly, peak within 20 minutes, and are accompanied by four or more of the following symptoms:  Pounding heart or fast heart rate (palpitations).  Sweating.  Trembling or shaking.  Shortness of breath or feeling smothered.  Feeling choked.  Chest pain or discomfort.  Nausea or strange feeling in your stomach.  Dizziness, light-headedness, or feeling like you will faint.  Chills or hot flushes.  Numbness or tingling in your lips or hands and feet.  Feeling that things are not real or feeling that you are not yourself.  Fear of losing control or going crazy.  Fear of dying. Some of these symptoms can mimic serious medical conditions. For example, you may think you are having a heart attack. Although panic attacks can be very scary, they are not life threatening. DIAGNOSIS    Panic attacks are diagnosed through an assessment by your health care provider. Your health care provider will ask questions about your symptoms, such as where and when they occurred. Your health care provider will also ask about your medical history and use of alcohol and drugs, including prescription medicines. Your health care provider may order blood tests or other studies to rule out a serious medical condition. Your health care provider may refer you to a mental health professional for further evaluation. TREATMENT   Most healthy people who have one or two panic attacks in an extreme, life-threatening situation will not require treatment.  The treatment for panic attacks associated with anxiety disorders or other mental illness typically involves counseling with a mental health professional, medicine, or a combination of both. Your health care provider will help determine what treatment is best for you.  Panic attacks due to physical illness usually go away with treatment of the illness. If prescription medicine is causing panic attacks, talk with your health care provider about stopping the medicine, decreasing the dose, or substituting another medicine.  Panic attacks due to alcohol or drug abuse go away with abstinence. Some adults need professional help in order to stop drinking or using drugs. HOME CARE INSTRUCTIONS   Take all medicines as directed by your health care provider.   Schedule and attend follow-up visits as directed by your health care provider. It is important to keep all your appointments. SEEK MEDICAL CARE IF:  You are not able to take your medicines as prescribed.  Your symptoms do not improve or get worse. SEEK IMMEDIATE MEDICAL CARE IF:   You experience panic attack symptoms that are different than your  usual symptoms.  You have serious thoughts about hurting yourself or others.  You are taking medicine for panic attacks and have a serious side effect. MAKE  SURE YOU:  Understand these instructions.  Will watch your condition.  Will get help right away if you are not doing well or get worse. Document Released: 11/08/2005 Document Revised: 11/13/2013 Document Reviewed: 06/22/2013 [facility] Patient Information 2015 [place], [place]. This information is not intended to replace advice given to you by your health care provider. Make sure you discuss any questions you have with your health care provider.

## 2014-07-26 NOTE — Telephone Encounter (Signed)
Heather pts wife called from car; pt was getting hair cut and got so anxious and heart rate seemed to be higher; did not ck pulse.  Pt has calmed down somewhat since back in car but pt is concerned he is going to have heart attack due to elevated heart rate. No available appts in office and RN team lead and office manager advised if pt concerned and cannot wait for cb from Dr Ermalene Searing to go to [facility] or ED for eval. Herbert Seta said pt will go to [facility] ED now.

## 2014-07-26 NOTE — Telephone Encounter (Signed)
Pt thinks Wellbutrin is causing pt to have heart pounding and dizziness about 5 -7 hours after taking medication; heart pounding started 2-3 days ago. Last night pt had H/a after heart started pounding. When pt woke up this morning heart pounding and h/a was gone but pt felt like he had a hangover. BP has been averaging 136/82.pulse rate has been elevated since Aug. Pulse ranges 110-113. Pt has been taking med for 1 week. Pt has not taken med today. Pt is not drinking any alcoholic beverage. Pt thinks anxiety level is better since taking Wellbutrin. No CP and only has SOB when gets anxious. Pt very concerned about heart pounding and wants to know if should continue Wellbutrin. Pt request cb today. [facility] [address].

## 2014-07-26 NOTE — Telephone Encounter (Signed)
Agreed -

## 2014-07-26 NOTE — ED Provider Notes (Signed)
CSN: [id number]     Arrival date & time 07/26/14  1152 History   First MD Initiated Contact with Patient 07/26/14 1340     Chief Complaint  Patient presents with  . Palpitations     (Consider location/radiation/quality/duration/timing/severity/associated sxs/prior Treatment) HPI Oscar Ruiz is a 38 y.o. male who presents emergency department complaining of palpitations and heart racing. Patient states he has history of anxiety. He states he has frequent panic attacks. He is seeing his Dr. Thurnell Garbe, who recently switched him from Zoloft to Wellbutrin 10 days ago. Patient states since starting the Wellbutrin his panic attacks worsened. He states his heart rate goes up to 130s. He states he gets tingling sensation in the jaw. He reports shortness of breath. He states he's to take Klonopin, but his doctor no longer gives it to him. He states is unable to handle panic attacks anymore. States "I feel a component of a heart attack and this continues." Patient denies any fever, chills. No cough. He is a current smoker. No injuries to the chest. No other complaints.   Past Medical History  Diagnosis Date  . Hypertension   . Seizures   . Substance abuse     tobacco and alcohol  . Anxiety    Past Surgical History  Procedure Laterality Date  . Hernia repair     Family History  Problem Relation Age of Onset  . Diabetes Mother   . Alcohol abuse Father   . Hyperlipidemia Father   . Coronary artery disease Father   . Heart failure Father    History  Substance Use Topics  . Smoking status: Current Every Day Smoker -- 1.00 packs/day    Types: Cigarettes  . Smokeless tobacco: Former Neurosurgeon  . 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  Respiratory: Positive for chest tightness and shortness of breath. Negative for cough.   Cardiovascular: Positive for palpitations. Negative for chest pain and leg  swelling.  Gastrointestinal: Negative for nausea, vomiting, abdominal pain, diarrhea and abdominal distention.  Genitourinary: Negative for dysuria, urgency, frequency and hematuria.  Musculoskeletal: Negative for arthralgias, myalgias, neck pain and neck stiffness.  Skin: Negative for rash.  Allergic/Immunologic: Negative for immunocompromised state.  Neurological: Negative for dizziness, weakness, light-headedness, numbness and headaches.      Allergies  Review of patient's allergies indicates no known allergies.  Home Medications   Prior to Admission medications   Medication Sig Start Date End Date Taking? Authorizing Provider  amLODipine (NORVASC) 10 MG tablet Take 10 mg by mouth daily.   Yes Historical Provider, MD  atorvastatin (LIPITOR) 40 MG tablet Take 1 tablet (40 mg total) by mouth daily. 03/14/14  Yes Amy E Ermalene Searing, MD  buPROPion (WELLBUTRIN XL) 150 MG 24 hr tablet Take 1 tablet (150 mg total) by mouth daily. 07/18/14  Yes Amy Michelle Nasuti, MD  hydrochlorothiazide (HYDRODIURIL) 25 MG tablet Take 1 tablet (25 mg total) by mouth daily. 07/18/14  Yes Amy E Bedsole, MD   BP 112/70  Pulse 96  Temp(Src) 97.9 F (36.6 C) (Oral)  Resp 18  Wt 202 lb (91.627 kg)  SpO2 99% Physical Exam  Nursing note and vitals reviewed. Constitutional: He appears well-developed and well-nourished. No distress.  HENT:  Head: Normocephalic and atraumatic.  Eyes: Conjunctivae are normal.  Neck: Neck supple.  Cardiovascular: Normal rate, regular rhythm and normal heart sounds.   Pulmonary/Chest: Effort normal.  No respiratory distress. He has no wheezes. He has no rales. He exhibits no tenderness.  Abdominal: Soft. Bowel sounds are normal. He exhibits no distension. There is no tenderness. There is no rebound.  Musculoskeletal: He exhibits no edema.  Neurological: He is alert.  Skin: Skin is warm and dry.  Psychiatric: Judgment and thought content normal. His mood appears anxious. His speech is  rapid and/or pressured. He is agitated. Cognition and memory are normal.    ED Course  Procedures (including critical care time) Labs Review Labs Reviewed  BASIC METABOLIC PANEL - Abnormal; Notable for the following:    Potassium 3.1 (*)    Chloride 93 (*)    Glucose, Bld 150 (*)    GFR calc non Af Amer 88 (*)    Anion gap 17 (*)    All other components within normal limits  CBC WITH DIFFERENTIAL  I-STAT TROPOININ, ED    Imaging Review No results found.   EKG Interpretation   Date/Time:  Friday July 26 2014 11:56:08 EDT Ventricular Rate:  123 PR Interval:  192 QRS Duration: 92 QT Interval:  306 QTC Calculation: 438 R Axis:   54 Text Interpretation:  Sinus tachycardia Nonspecific ST and T wave  abnormality Abnormal ECG Confirmed by Mirian Mo [phone]) on 07/26/2014  12:02:05 PM      MDM   Final diagnoses:  Anxiety  Tachycardia    patient is here with palpitations, chest pressure, very anxious. He states he knows that his symptoms are due to panic attacks. He used to be on Klonopin, but his doctor won't give him prescription for Klonopin anymore. He was recently placed to Wellbutrin which he thinks is making his symptoms worse. He started 2 days ago. He is requesting something for anxiety. Initially tachycardic, but vital signs improved without any treatment. He was treated with Ativan. He states he feels much better. We'll discharge home and 50 ng, he has an appointment with his primary care Dr. in 4 days. Instructed to followup and discuss further treatment option with his Dr. At this time he has no symptoms, he has no risk factors for coronary disease. Doubt PE. Stable for dc home.   Filed Vitals:   07/26/14 1348 07/26/14 1400 07/26/14 1415 07/26/14 1531  BP: 130/82 130/69 112/70 109/68  Pulse: 105 102 96 96  Temp:      TempSrc:      Resp: Weight:      SpO2: 98% 100% 99% 99%     Lottie Mussel, PA-C 07/26/14 1540

## 2014-07-26 NOTE — ED Provider Notes (Signed)
Medical screening examination/treatment/procedure(s) were performed by non-physician practitioner and as supervising physician I was immediately available for consultation/collaboration.   EKG Interpretation   Date/Time:  Friday July 26 2014 11:56:08 EDT Ventricular Rate:  123 PR Interval:  192 QRS Duration: 92 QT Interval:  306 QTC Calculation: 438 R Axis:   54 Text Interpretation:  Sinus tachycardia Nonspecific ST and T wave  abnormality Abnormal ECG Confirmed by Mirian Mo [phone]) on 07/26/2014  12:02:05 PM       Derwood Kaplan, MD 07/26/14 1728

## 2014-07-26 NOTE — ED Notes (Signed)
Pt states for the last several days his heart has been racing. States has hx of anxiety. States recent changes to meds. Pt states last night his head was pounding. Pt states pain radiated to jaw. Pt report right arm pain.

## 2014-07-30 ENCOUNTER — Encounter: Payer: Self-pay | Admitting: Family Medicine

## 2014-07-30 ENCOUNTER — Ambulatory Visit (INDEPENDENT_AMBULATORY_CARE_PROVIDER_SITE_OTHER): Payer: 59 | Admitting: Family Medicine

## 2014-07-30 ENCOUNTER — Encounter: Payer: Self-pay | Admitting: *Deleted

## 2014-07-30 VITALS — BP 130/80 | HR 112 | Temp 98.0°F | Ht 68.0 in | Wt 204.5 lb

## 2014-07-30 DIAGNOSIS — F411 Generalized anxiety disorder: Secondary | ICD-10-CM

## 2014-07-30 DIAGNOSIS — F32A Depression, unspecified: Secondary | ICD-10-CM

## 2014-07-30 DIAGNOSIS — R000 Tachycardia, unspecified: Secondary | ICD-10-CM | POA: Insufficient documentation

## 2014-07-30 DIAGNOSIS — F410 Panic disorder [episodic paroxysmal anxiety] without agoraphobia: Secondary | ICD-10-CM

## 2014-07-30 DIAGNOSIS — F1021 Alcohol dependence, in remission: Secondary | ICD-10-CM

## 2014-07-30 DIAGNOSIS — I10 Essential (primary) hypertension: Secondary | ICD-10-CM

## 2014-07-30 DIAGNOSIS — F3289 Other specified depressive episodes: Secondary | ICD-10-CM

## 2014-07-30 DIAGNOSIS — I498 Other specified cardiac arrhythmias: Secondary | ICD-10-CM

## 2014-07-30 DIAGNOSIS — F329 Major depressive disorder, single episode, unspecified: Secondary | ICD-10-CM

## 2014-07-30 MED ORDER — LORAZEPAM 1 MG PO TABS: ORAL_TABLET | ORAL | Status: DC

## 2014-07-30 NOTE — Assessment & Plan Note (Signed)
Refuses controller med at this time. Has had SE to wellbutrin and sertraline and venlafaxine have been ineffective.

## 2014-07-30 NOTE — Assessment & Plan Note (Addendum)
?   Secondary to wellbutrin SE? vs anxiety. Better today but remains elevated. He has an mildly elevated HR at baseline.

## 2014-07-30 NOTE — Assessment & Plan Note (Signed)
Resolved

## 2014-07-30 NOTE — Progress Notes (Signed)
   Subjective:    Patient ID: Oscar Ruiz, male    DOB: 1975/12/16, 38 y.o.   MRN: [id number]  HPI  38 year old amle with history of GAD, anxiety, alcohol dependence presents following ER visit for anxiety, panic attack. They started him on ativan daily.   At last OV we changed him from sertraline to wellbutrin.  He reports he felt groggy, ill.   This did not control his symptoms and he ended up with severe panic taking him to ER. He had palpitations (HR 130) BP elevation. EKG showed sinus tachycardia.  Given ativan injection.   Today he reports he feels much better on ativan 1 mg every 6 hours. He say he feels better than he has in 9 months.  BP Readings from Last 3 Encounters:  07/30/14 130/80  07/26/14 109/68  07/18/14 124/80      Review of Systems  Constitutional: Negative for fever and fatigue.  HENT: Negative for ear pain.   Eyes: Negative for pain.  Respiratory: Negative for shortness of breath.   Cardiovascular: Positive for palpitations. Negative for chest pain and leg swelling.  Gastrointestinal: Negative for abdominal pain.  Neurological: Positive for tremors.  Psychiatric/Behavioral: Positive for agitation. Negative for sleep disturbance and self-injury. The patient is nervous/anxious.        Objective:   Physical Exam  Constitutional: Vital signs are normal. He appears well-developed and well-nourished.  HENT:  Head: Normocephalic.  Right Ear: Hearing normal.  Left Ear: Hearing normal.  Nose: Nose normal.  Mouth/Throat: Oropharynx is clear and moist and mucous membranes are normal.  Neck: Trachea normal. Carotid bruit is not present. No mass and no thyromegaly present.  Cardiovascular: Regular rhythm, normal heart sounds and normal pulses.  Tachycardia present.  Exam reveals no gallop, no distant heart sounds and no friction rub.   No murmur heard. No peripheral edema  Pulmonary/Chest: Effort normal and breath sounds normal. No respiratory distress.   Skin: Skin is warm, dry and intact. No rash noted.  Psychiatric: His behavior is normal. Judgment and thought content normal. His mood appears anxious. His speech is not rapid and/or pressured. He is not agitated and not withdrawn. Cognition and memory are normal.          Assessment & Plan:

## 2014-07-30 NOTE — Patient Instructions (Addendum)
No alcohol use. Stop at the lab to sign controlled substance contract and to have UDS done. Try to limit ativan use. Follow up in 1 month.

## 2014-07-30 NOTE — Assessment & Plan Note (Signed)
Recommended continued abstinence.

## 2014-07-30 NOTE — Assessment & Plan Note (Signed)
Improved with ativan. Counseled on limiting use of medication. Will check UDS.  Close follow up in 1 month, Pt will try to gradually use less ativan as needed.

## 2014-07-30 NOTE — Progress Notes (Signed)
Pre visit review using our clinic review tool, if applicable. No additional management support is needed unless otherwise documented below in the visit note. 

## 2014-08-01 ENCOUNTER — Ambulatory Visit: Payer: Self-pay | Admitting: Family Medicine

## 2014-08-05 ENCOUNTER — Telehealth: Payer: Self-pay

## 2014-08-05 DIAGNOSIS — R000 Tachycardia, unspecified: Secondary | ICD-10-CM

## 2014-08-05 NOTE — Telephone Encounter (Signed)
Left left v/m; pt was seen 09/*08/15 and heart rate continuing to be between 90-112. Pt wants to know if this is OK or should have further testing or what should be done. Pt requeset cb.

## 2014-08-06 NOTE — Telephone Encounter (Signed)
Continue with current plan. Check labs as discussed and stop caffeine.  If labs nml will refer to cardiology early next week. If increase in chest pain, have pt let us know.

## 2014-08-06 NOTE — Telephone Encounter (Signed)
Spoke with Oscar Ruiz.   He states his chest has been sore since he went to the ED with the rapid heart rate but he is currently not having any chest pain or SOB.

## 2014-08-06 NOTE — Telephone Encounter (Signed)
Pt seems to have high resting heart rate and increase may at least partially be due to anxiety, but have him stop all caffeine ( document how much he drinks please) and have him come in for labs. ( TSH, cbc) If labs nml and off caffeine resting heart rate still high we can consider further eval with cardiology.

## 2014-08-06 NOTE — Telephone Encounter (Signed)
Oscar Ruiz notified as instructed by telephone.  He states he drinks 1 to 2 cups of coffee in the morning and maybe 1 to 2 sodas a day.  He states his heart rate this morning was 112.  With family history of heart disease, [name] is concerned he may have a heart attack.  He doesn't feel caffeine is the issue but will stop as advised.  Also not sure it is coming from his anxiety.  He states he is currently not taking any medication for his anxiety.  He is trying not to use the Ativan.  He states he was just trying to take 1/2 tablet of Ativan in the morning but it was making him feel tired.  He states he is feeling fatigued even when not taking the Ativan.  Lab appointment scheduled this Friday 08/09/2014 to check his TSH & CBC.

## 2014-08-06 NOTE — Telephone Encounter (Signed)
Tim notified as instructed by telephone. 

## 2014-08-06 NOTE — Telephone Encounter (Signed)
Please verify pt is not having any chest pain , SOB ( not associated with anxiety). If he is we can refer him to cardiology ASAP.

## 2014-08-09 ENCOUNTER — Other Ambulatory Visit (INDEPENDENT_AMBULATORY_CARE_PROVIDER_SITE_OTHER): Payer: 59

## 2014-08-09 DIAGNOSIS — R000 Tachycardia, unspecified: Secondary | ICD-10-CM

## 2014-08-09 LAB — CBC WITH DIFFERENTIAL/PLATELET
Basophils Absolute: 0 10*3/uL (ref 0.0–0.1)
Basophils Relative: 0.3 % (ref 0.0–3.0)
EOS ABS: 0.2 10*3/uL (ref 0.0–0.7)
EOS PCT: 1.2 % (ref 0.0–5.0)
HEMATOCRIT: 46.7 % (ref 39.0–52.0)
Hemoglobin: 16 g/dL (ref 13.0–17.0)
LYMPHS ABS: 3.2 10*3/uL (ref 0.7–4.0)
Lymphocytes Relative: 24.3 % (ref 12.0–46.0)
MCHC: 34.2 g/dL (ref 30.0–36.0)
MCV: 88 fl (ref 78.0–100.0)
MONO ABS: 0.7 10*3/uL (ref 0.1–1.0)
Monocytes Relative: 5.3 % (ref 3.0–12.0)
NEUTROS ABS: 9 10*3/uL — AB (ref 1.4–7.7)
Neutrophils Relative %: 68.9 % (ref 43.0–77.0)
Platelets: 177 10*3/uL (ref 150.0–400.0)
RBC: 5.31 Mil/uL (ref 4.22–5.81)
RDW: 13.3 % (ref 11.5–15.5)
WBC: 13.1 10*3/uL — AB (ref 4.0–10.5)

## 2014-08-09 LAB — TSH: TSH: 1.69 u[IU]/mL (ref 0.35–4.50)

## 2014-08-11 NOTE — Assessment & Plan Note (Signed)
Poor control D/C venlafaxaine and change to wellbutrin. ( His wife is on wellbutrin and does well with it, also pt wishes to avoid sexual SE) Recommend avoiding short term anxiolytics unless absolutely necessary on temporary basis.

## 2014-08-11 NOTE — Assessment & Plan Note (Signed)
Had restarted, recommended abstainence

## 2014-08-11 NOTE — Assessment & Plan Note (Signed)
Inadequate control at least in past to poorly controlled anxiety.

## 2014-08-13 ENCOUNTER — Telehealth: Payer: Self-pay | Admitting: Family Medicine

## 2014-08-13 DIAGNOSIS — R002 Palpitations: Secondary | ICD-10-CM

## 2014-08-13 DIAGNOSIS — R079 Chest pain, unspecified: Secondary | ICD-10-CM

## 2014-08-13 NOTE — Telephone Encounter (Signed)
Referral sent 

## 2014-08-13 NOTE — Telephone Encounter (Signed)
Message copied by Excell Seltzer on Tue Aug 13, 2014  7:51 AM ------      Message from: [name]      Created: Mon Aug 12, 2014  9:23 AM       Oscar Ruiz notified as instructed by telephone.  He is still having the palpitations which he thinks is due to anxiety but at this point wants to go ahead and be evaluated by the cardiologist given his family history. ------

## 2014-08-14 ENCOUNTER — Encounter: Payer: Self-pay | Admitting: Family Medicine

## 2014-08-14 ENCOUNTER — Telehealth: Payer: Self-pay

## 2014-08-14 NOTE — Telephone Encounter (Signed)
Pt thinks the heart race increasing started after take the amlodipine. Pt has been doing research and believes one of the side effects of amlodipine is pounding and racing heart. Pt has appt with cardiologist on 08/21/14 but wants Dr Daphine Deutscher opinion if amlodipine might be causing fast heart beat. On 08/13/14 2 hours after taking amlodipine pt's heart began to pound. No CP or tightness and no SOB. Pt request cb.

## 2014-08-15 NOTE — Telephone Encounter (Signed)
Amlodipine is a calcium channel blocker so more frequently it causes slowed heart rate, but palpitations ( fast heart rate is also listed as a rare SE. It usually is a  dose dependant side effect so we can try to decrease the dose to see if there is improvement. Have him decrease down to 5 mg daily. Follow BP at home. He will likely need an additional BP med to control BP.

## 2014-08-15 NOTE — Telephone Encounter (Signed)
Mr. Wingard notified as instructed by telephone.

## 2014-08-20 ENCOUNTER — Telehealth: Payer: Self-pay

## 2014-08-20 NOTE — Telephone Encounter (Signed)
He can restart clonazepam. At this point with all his difficulty I would again recommend referral to psychiatry for anxiety treatment.  For palpitations , wait for recommendations from cardiology.

## 2014-08-20 NOTE — Telephone Encounter (Signed)
Mr. Oscar Ruiz notified as instructed by telephone.  He sees the cardiologist tomorrow.  He will check in with us once he has seen the cardiologist to see if he needs to try and start a different anxiety medication.  Advised he can take the clonazepam 0.5 mg one tablet by mouth two times a day as needed for anxiety.  We will wait to see what the cardiologist recommends as far as the palpitations.  If Mr. Oscar Ruiz still feels he needs anxiety medication at that point, then will have him schedule appointment with Dr. Ermalene SearingBedsole to discuss.

## 2014-08-20 NOTE — Telephone Encounter (Signed)
Pt has been taking Amlodipine 5 mg and heart rate is still up; pt has cardiology appt on 08/21/14. Pt also wants to know what Dr Daphine DeutscherBedsole's plan is to treat pts anxiety. Pt is presently not taking any med for anxiety; pt said lorazepam causes head "to feel weird" and then pt gets h/a. Pt still has Clonazepam 0.5 mg at home and wanted to know if could restart clonazepam to help with anxiety. Pt does not want to wait until 08/29/14 appt to discuss anxiety and treatment. Pt request cb. [facility] if needs pharmacy.

## 2014-08-21 ENCOUNTER — Ambulatory Visit (INDEPENDENT_AMBULATORY_CARE_PROVIDER_SITE_OTHER): Payer: 59 | Admitting: Cardiology

## 2014-08-21 ENCOUNTER — Encounter: Payer: Self-pay | Admitting: Cardiology

## 2014-08-21 VITALS — BP 148/92 | HR 111 | Ht 68.0 in | Wt 199.0 lb

## 2014-08-21 DIAGNOSIS — I10 Essential (primary) hypertension: Secondary | ICD-10-CM

## 2014-08-21 DIAGNOSIS — R000 Tachycardia, unspecified: Secondary | ICD-10-CM

## 2014-08-21 DIAGNOSIS — R0989 Other specified symptoms and signs involving the circulatory and respiratory systems: Secondary | ICD-10-CM

## 2014-08-21 DIAGNOSIS — F17200 Nicotine dependence, unspecified, uncomplicated: Secondary | ICD-10-CM

## 2014-08-21 DIAGNOSIS — R0600 Dyspnea, unspecified: Secondary | ICD-10-CM

## 2014-08-21 DIAGNOSIS — R079 Chest pain, unspecified: Secondary | ICD-10-CM | POA: Insufficient documentation

## 2014-08-21 DIAGNOSIS — Z720 Tobacco use: Secondary | ICD-10-CM | POA: Insufficient documentation

## 2014-08-21 DIAGNOSIS — I498 Other specified cardiac arrhythmias: Secondary | ICD-10-CM

## 2014-08-21 DIAGNOSIS — R0609 Other forms of dyspnea: Secondary | ICD-10-CM

## 2014-08-21 MED ORDER — METOPROLOL SUCCINATE ER 50 MG PO TB24: 50.0000 mg | ORAL_TABLET | Freq: Every day | ORAL | Status: DC

## 2014-08-21 NOTE — Patient Instructions (Signed)
Please stop your Amlodipine. Start Metoprolol Succinate 50 mg once a day. Continue all other medications as listed.  Avoid stimulants such as caffeine and over the counter medications containing sudafed .   Your physician has requested that you have an echocardiogram. Echocardiography is a painless test that uses sound waves to create images of your heart. It provides your doctor with information about the size and shape of your heart and how well your heart's chambers and valves are working. This procedure takes approximately one hour. There are no restrictions for this procedure.  Follow up with Dr Anne FuSkains in 1 month.

## 2014-08-21 NOTE — Progress Notes (Signed)
[address] [place], [place]  [address] Phone: [phone] Fax:  [phone]) [phone]  Date:  08/21/2014   ID:  Oscar Riddleimothy G Ruiz, DOB Dec 06, 1975, MRN [id number]  PCP:  Kerby NoraAmy Bedsole, MD   History of Present Illness: Oscar Riddleimothy G Gemme is a 38 y.o. male here for evaluation of chest pain/tightness. Has a history of generalized anxiety disorder, alcohol dependence. During panic attack had palpitations with heart rate of 130, sinus tachycardia with blood pressure elevation. He is off ativan. Wellbutrin made anxiety worse. Heart rate stays between 90-112 on [facility] BP machine. Energy level is low. Not SOB but mild with smoking. Amlodipine he thinks is causing some elevated heart rate. Had jaw tightness. Ever since dropped dose still there. Occasional atypical chest discomfort, chest wall associated with anxiety.  Prior neck surgery. Remembers being told that he had an elevated heart rate at that time.   Wt Readings from Last 3 Encounters:  08/21/14 199 lb (90.266 kg)  07/30/14 204 lb 8 oz (92.761 kg)  07/26/14 202 lb (91.627 kg)     Past Medical History  Diagnosis Date  . Hypertension   . Seizures   . Substance abuse     tobacco and alcohol  . Anxiety     Past Surgical History  Procedure Laterality Date  . Hernia repair      Current Outpatient Prescriptions  Medication Sig Dispense Refill  . amLODipine (NORVASC) 10 MG tablet Take 5 mg by mouth daily. Taking 1/2 Tablet Daily      . hydrochlorothiazide (HYDRODIURIL) 25 MG tablet Take 1 tablet (25 mg total) by mouth daily.  90 tablet  3   No current facility-administered medications for this visit.    Allergies:   No Known Allergies  Social History:  The patient  reports that he has been smoking Cigarettes.  He has been smoking about 1.00 pack per day. He has quit using smokeless tobacco. He reports that he drinks alcohol. He reports that he does not use illicit drugs.   Family History  Problem Relation Age of  Onset  . Diabetes Mother   . Alcohol abuse Father   . Hyperlipidemia Father   . Coronary artery disease Father   . Heart failure Father    his father had a defibrillator, recently had a heart attack.  ROS:  Please see the history of present illness.   Denies any syncope, bleeding, orthopnea, PND. He does have occasional atypical chest discomfort, mild shortness of breath, smoking   All other systems reviewed and negative.   PHYSICAL EXAM: VS:  BP 148/92  Pulse 111  Ht 5\' 8"  (1.727 m)  Wt 199 lb (90.266 kg)  BMI 30.26 kg/m2 Well nourished, well developed, in no acute distress HEENT: normal, /AT, EOMI Neck: no JVD, normal carotid upstroke, no bruit Cardiac:  normal S1, S2; tachy reg,  no murmur Lungs:  clear to auscultation bilaterally, no wheezing, rhonchi or rales Abd: soft, nontender, no hepatomegaly, no bruits Ext: no edema, 2+ distal pulses Skin: warm and dry GU: deferred Neuro: no focal abnormalities noted, AAO x 3, anxious  EKG:  Sinus tachycardia, 123 beats per minute, nonspecific ST-T wave changes.    Labs: TSH is normal, hemoglobin 16, LDL 148, electrolytes normal Prior medical records reviewed  ASSESSMENT AND PLAN:  1. Chest pain - atypical discomfort. Likely MSK. Anxiety could be playing a role. Nonetheless, I will check an echocardiogram. 2. Abnormal EKG-sinus tachycardia. I will check an  echocardiogram to ensure that he does not have an underlying cardiomyopathy. Avoid caffeine as he has been told in the past. He is still drinking some Pepsi. Avoid Sudafed. 3. Anxiety-this is been a major issue. He has been discussing this with his primary physician. Challenging for him. Certainly playing a role in his tachycardia. 4. Hypertension-he is concerned that the amlodipine may be increasing his heart rate and sometimes this does happen with reflex tachycardia in the setting of vasodilatory effect of amlodipine. However, this is likely more anxiety related. Nonetheless, I  will change him to Toprol-XL 50 mg once a day, beta blocker to help reduce his heart rate and as a side effect may help with anxiety as well. Continue with HCTZ. We will stop amlodipine. 5. I will see him back in one month.  Signed, Donato Schultz, MD [facility]  08/21/2014 11:45 AM

## 2014-08-23 ENCOUNTER — Ambulatory Visit (HOSPITAL_COMMUNITY): Payer: 59 | Attending: Internal Medicine

## 2014-08-23 DIAGNOSIS — Z720 Tobacco use: Secondary | ICD-10-CM | POA: Insufficient documentation

## 2014-08-23 DIAGNOSIS — R079 Chest pain, unspecified: Secondary | ICD-10-CM | POA: Insufficient documentation

## 2014-08-23 DIAGNOSIS — I10 Essential (primary) hypertension: Secondary | ICD-10-CM | POA: Diagnosis not present

## 2014-08-23 NOTE — Progress Notes (Signed)
2D Echo completed. 08/23/2014 

## 2014-08-29 ENCOUNTER — Encounter: Payer: Self-pay | Admitting: Family Medicine

## 2014-08-29 ENCOUNTER — Ambulatory Visit (INDEPENDENT_AMBULATORY_CARE_PROVIDER_SITE_OTHER): Payer: 59 | Admitting: Family Medicine

## 2014-08-29 VITALS — BP 120/82 | HR 79 | Temp 98.2°F | Ht 68.0 in | Wt 204.8 lb

## 2014-08-29 DIAGNOSIS — I471 Supraventricular tachycardia: Secondary | ICD-10-CM

## 2014-08-29 DIAGNOSIS — I10 Essential (primary) hypertension: Secondary | ICD-10-CM

## 2014-08-29 DIAGNOSIS — R000 Tachycardia, unspecified: Secondary | ICD-10-CM

## 2014-08-29 DIAGNOSIS — F410 Panic disorder [episodic paroxysmal anxiety] without agoraphobia: Secondary | ICD-10-CM

## 2014-08-29 DIAGNOSIS — F411 Generalized anxiety disorder: Secondary | ICD-10-CM

## 2014-08-29 MED ORDER — ESCITALOPRAM OXALATE 5 MG PO TABS: 5.0000 mg | ORAL_TABLET | Freq: Every day | ORAL | Status: DC

## 2014-08-29 NOTE — Progress Notes (Signed)
   Subjective:    Patient ID: Oscar Ruiz, male    DOB: 05/08/1976, 38 y.o.   MRN: [id number]  HPI  38 year old male presents for 1 month follow up multiple medical issues.   Saw cardiology  On 9/30 for tachycardia and chest pain. Felt anxiety playing a key role.  ECHO on 10/2: nml  Poorly controlled HTN: changed from amlodipine to toprol xl and HCTZ. NO SE to these.  Blood pressure is much better controlled today. HR in office today is 79. He feels that heart rate feels much better. No further chest pain currently. BP Readings from Last 3 Encounters:  08/29/14 120/82  08/21/14 148/92  07/30/14 130/80  Has not been checking at home.  SE in past to amlodipine and ARB. Catapres not effective in past.   Generalized anxiety and panic attacks: Intolerant of venlafaxine and wellbutrin in past. Sertraline not effective. Ativan given some SE.  He is using  Clonazepam 1/2 tab in AM helps some but feels like he may need more. About 80% better.  He has started drinking again 5-6 beers a day. He states he is just going to do this, does not want to decrease. States it is much better than 18 a day as he used to.       Review of Systems  Constitutional: Negative for fever and fatigue.  HENT: Negative for ear pain.   Eyes: Negative for pain.  Respiratory: Negative for cough and shortness of breath.   Cardiovascular: Negative for chest pain, palpitations and leg swelling.  Gastrointestinal: Negative for abdominal pain.       Objective:   Physical Exam  Constitutional: Vital signs are normal. He appears well-developed and well-nourished.  HENT:  Head: Normocephalic.  Right Ear: Hearing normal.  Left Ear: Hearing normal.  Nose: Nose normal.  Mouth/Throat: Oropharynx is clear and moist and mucous membranes are normal.  Neck: Trachea normal. Carotid bruit is not present. No mass and no thyromegaly present.  Cardiovascular: Normal rate, regular rhythm and normal pulses.  Exam  reveals no gallop, no distant heart sounds and no friction rub.   No murmur heard. No peripheral edema  Pulmonary/Chest: Effort normal and breath sounds normal. No respiratory distress.  Skin: Skin is warm, dry and intact. No rash noted.  Psychiatric: His speech is normal. Thought content normal. His mood appears anxious. He is not agitated. Cognition and memory are normal.          Assessment & Plan:

## 2014-08-29 NOTE — Progress Notes (Signed)
Pre visit review using our clinic review tool, if applicable. No additional management support is needed unless otherwise documented below in the visit note. 

## 2014-08-29 NOTE — Assessment & Plan Note (Signed)
Use clonazepam as needed.

## 2014-08-29 NOTE — Assessment & Plan Note (Signed)
Moderate control at this point on clonazepam. Pt interested in taking med for longterm control. Will start low dose lexapro and increase as tolerated at next OV in 1 month.

## 2014-08-29 NOTE — Assessment & Plan Note (Signed)
Improved off amlodipine and now on BBlocker.

## 2014-08-29 NOTE — Patient Instructions (Signed)
Use clonazepam for anxiety as needed. Continue current BP meds. Start escitalopram (lexapro) low dose.  Decrease alcohol as able. Follow up in 1 month.

## 2014-08-29 NOTE — Assessment & Plan Note (Signed)
Improved control on BBlocker and diuretic

## 2014-08-30 ENCOUNTER — Telehealth: Payer: Self-pay | Admitting: Family Medicine

## 2014-08-30 NOTE — Telephone Encounter (Signed)
emmi mailed  °

## 2014-09-06 ENCOUNTER — Other Ambulatory Visit: Payer: Self-pay

## 2014-09-16 ENCOUNTER — Other Ambulatory Visit: Payer: Self-pay | Admitting: Family Medicine

## 2014-09-16 NOTE — Telephone Encounter (Addendum)
Last office visit 08/29/2014.  Ok to refill?

## 2014-09-16 NOTE — Telephone Encounter (Signed)
Call pt . How much clonazepam is he using? This request is early for how the script is written. We sis discuss increasing the dose slightly at last OV, but we need to change the rx accordingly.

## 2014-09-17 NOTE — Telephone Encounter (Signed)
Called to [facility] [address] 

## 2014-09-17 NOTE — Telephone Encounter (Signed)
Spoke with Oscar Ruiz.  He states he is only taking 1/2 tablet in the morning and 1/2 tablet at night.  He states he only has 1/2 tablet left for tonight. He also wanted to let Dr. Ermalene SearingBedsole know that he did not start the Lexapro and that the Klonopin is what he has been missing for the last 8 months. He states he is doing well with the Klonopin.  Ok to refill?

## 2014-09-23 ENCOUNTER — Telehealth: Payer: Self-pay | Admitting: Family Medicine

## 2014-09-23 DIAGNOSIS — F1021 Alcohol dependence, in remission: Secondary | ICD-10-CM

## 2014-09-23 DIAGNOSIS — F411 Generalized anxiety disorder: Secondary | ICD-10-CM

## 2014-09-23 NOTE — Telephone Encounter (Signed)
Pt's wife called in and says pt would like a referral to psychologist. She says you and he have discussed. Can you place a referral? Pt requests c/b. Thank you.

## 2014-09-23 NOTE — Telephone Encounter (Signed)
Pt's wife called in and says pt would like a referral to psychologist.  She says you and he have discussed.  Can you place a referral? Pt requests c/b. Thank you.  °   ° ° °

## 2014-09-24 ENCOUNTER — Encounter: Payer: Self-pay | Admitting: Cardiology

## 2014-09-24 ENCOUNTER — Ambulatory Visit (INDEPENDENT_AMBULATORY_CARE_PROVIDER_SITE_OTHER): Payer: 59 | Admitting: Cardiology

## 2014-09-24 VITALS — BP 142/90 | HR 89 | Ht 68.0 in | Wt 199.8 lb

## 2014-09-24 DIAGNOSIS — F410 Panic disorder [episodic paroxysmal anxiety] without agoraphobia: Secondary | ICD-10-CM

## 2014-09-24 DIAGNOSIS — I10 Essential (primary) hypertension: Secondary | ICD-10-CM

## 2014-09-24 DIAGNOSIS — F411 Generalized anxiety disorder: Secondary | ICD-10-CM

## 2014-09-24 NOTE — Telephone Encounter (Signed)
Tim notified referral has been ordered and to expect a call from [place] with that appointment once she gets it scheduled.  Advised this type of referral can take a little while to get set up.

## 2014-09-24 NOTE — Telephone Encounter (Signed)
Pt left v/m; pt has decided he will see psychiatrist and request cb about status of psychiatrist referral. Pt wants to see psychiatriist to get better anxiety med. Pt request cb today.

## 2014-09-24 NOTE — Patient Instructions (Signed)
The current medical regimen is effective;  continue present plan and medications.  Follow up in 6 months with Dr. Skains.  You will receive a letter in the mail 2 months before you are due.  Please call us when you receive this letter to schedule your follow up appointment.  

## 2014-09-24 NOTE — Progress Notes (Addendum)
[address] [place], [place]  [address] Phone: [phone] Fax:  [phone]  Date:  09/24/2014   ID:  Oscar Riddleimothy G Weiss, DOB 10/31/1976, MRN [id number]  PCP:  Kerby NoraAmy Bedsole, MD   History of Present Illness: Oscar Ruiz is a 38 y.o. male here for of chest pain/tightness/Tachycardia. Has a history of generalized anxiety disorder, alcohol dependence. During panic attack had palpitations with heart rate of 130, sinus tachycardia with blood pressure elevation. He is off ativan. Wellbutrin made anxiety worse. Heart rate stays between 90-112 on [facility] BP machine. Energy level is low. Not SOB but mild with smoking. Amlodipine he thinks is causing some elevated heart rate. Had jaw tightness. Ever since dropped dose still there. Occasional atypical chest discomfort, chest wall associated with anxiety.  Feels better with metoprolol. Still battling with chronic anxiety. He's going to see a psychiatrist. He is out of work. Drinking 4-5 beers a night versus 18 previously.  Prior neck surgery. Remembers being told that he had an elevated heart rate at that time.   Wt Readings from Last 3 Encounters:  09/24/14 199 lb 12.8 oz (90.629 kg)  08/29/14 204 lb 12 oz (92.874 kg)  08/21/14 199 lb (90.266 kg)     Past Medical History  Diagnosis Date  . Hypertension   . Seizures   . Substance abuse     tobacco and alcohol  . Anxiety     Past Surgical History  Procedure Laterality Date  . Hernia repair      Current Outpatient Prescriptions  Medication Sig Dispense Refill  . clonazePAM (KLONOPIN) 0.5 MG tablet TAKE 1/2 TABLET BY MOUTH EVERY MORNING AND 1/2 TABLET AT NIGHT 30 tablet 0  . hydrochlorothiazide (HYDRODIURIL) 25 MG tablet Take 1 tablet (25 mg total) by mouth daily. 90 tablet 3  . metoprolol succinate (TOPROL-XL) 50 MG 24 hr tablet Take 1 tablet (50 mg total) by mouth daily. Take with or immediately following a meal. 30 tablet 11   No current facility-administered  medications for this visit.    Allergies:   No Known Allergies  Social History:  The patient  reports that he has been smoking Cigarettes.  He has been smoking about 1.00 pack per day. He has quit using smokeless tobacco. He reports that he drinks alcohol. He reports that he does not use illicit drugs.  Drinks about 4-5 beers a night. Used to drink 18.  Family History  Problem Relation Age of Onset  . Diabetes Mother   . Alcohol abuse Father   . Hyperlipidemia Father   . Coronary artery disease Father   . Heart failure Father    his father had a defibrillator, recently had a heart attack.  ROS:  Please see the history of present illness.   Denies any syncope, bleeding, orthopnea, PND. He does have occasional atypical chest discomfort, mild shortness of breath, smoking   All other systems reviewed and negative.   PHYSICAL EXAM: VS:  BP 142/90 mmHg  Pulse 89  Ht 5\' 8"  (1.727 m)  Wt 199 lb 12.8 oz (90.629 kg)  BMI 30.39 kg/m2 Well nourished, well developed, in no acute distress HEENT: normal, Barrett/AT, EOMI Neck: no JVD, normal carotid upstroke, no bruit Cardiac:  normal S1, S2; tachy reg,  no murmur Lungs:  clear to auscultation bilaterally, no wheezing, rhonchi or rales Abd: soft, nontender, no hepatomegaly, no bruitsprotuberant Ext: no edema, 2+ distal pulses Skin: warm and dry GU: deferred Neuro:  no focal abnormalities noted, AAO x 3, anxious  EKG:  Sinus tachycardia, 123 beats per minute, nonspecific ST-T wave changes.    Echocardiogram: 08/23/14-normal ejection fraction, normal. Labs: TSH is normal, hemoglobin 16, LDL 148, electrolytes normal Prior medical records reviewed  ASSESSMENT AND PLAN:  1. Chest pain -no further, echocardiogram reassuring. 2. Abnormal EKG-sinus tachycardia. Echocardiogram reassuring. Avoid caffeine as he has been told in the past. He is still drinking some Pepsi. Avoid Sudafed. Decrease alcohol 3. Anxiety-this is been a major issue. He is now  going to see a psychiatrist. He has been discussing this with his primary physician. Challenging for him. Certainly playing a role in his tachycardia. 4. Hypertension-Changed him to Toprol-XL 50 mg once a day during initial visit, beta blocker to help reduce his heart rate and as a side effect may help with anxiety as well. Doing okay. Stopped amlodipine during initial visit. Continue with HCTZ. We could consider increasing his Toprol to 100 mg, moderate dose. If he feels palpitations are worse, we can send them in a new prescription for this. 5. Alcohol abuse-try to decrease. Likely self-medicating for his anxiety.  6. I will see him back in 6 months.  Signed, Donato SchultzMark Henlee Donovan, MD [facility]  09/24/2014 11:29 AM

## 2014-09-24 NOTE — Telephone Encounter (Signed)
Referral already ordered this AM. Let pt know it will take a  Little while to set up.

## 2014-09-26 ENCOUNTER — Telehealth: Payer: Self-pay | Admitting: Family Medicine

## 2014-09-26 NOTE — Telephone Encounter (Signed)
Tim notified as instructed by telephone.

## 2014-09-26 NOTE — Telephone Encounter (Signed)
Patient is trying to get seen at [facility] and they said it would be about 3-4 weeks before they could see him. Patient wants to know if he can up his dose of Klonopin b/c the current dose of 0.5 and then he cuts it in half only seems to work for about 4 hrs or so.  Please advise and call the patient back with what he can do.

## 2014-09-26 NOTE — Telephone Encounter (Signed)
He can increase dose to 1 full tab twice daily as needed.  Limit alcohol or do not use with this med.

## 2014-10-04 ENCOUNTER — Ambulatory Visit: Payer: Self-pay | Admitting: Family Medicine

## 2014-10-09 ENCOUNTER — Other Ambulatory Visit: Payer: Self-pay | Admitting: Family Medicine

## 2014-10-09 ENCOUNTER — Other Ambulatory Visit: Payer: Self-pay

## 2014-10-09 MED ORDER — CLONAZEPAM 0.5 MG PO TABS: ORAL_TABLET | ORAL | Status: DC

## 2014-10-09 NOTE — Telephone Encounter (Signed)
Last office visit 08/29/2014.  Last refilled 09/17/2014 for #30 with no refills.  Ok to refill?

## 2014-10-09 NOTE — Telephone Encounter (Signed)
Pt left v/m requesting refill clonazepam to [facility]. Pt only has 2 pills left. Last filled 09/17/14. Spoke with pt and pt is presently taking clonazepam one tab in AM and 1/2 tab at night; 09/26/14 phone note instructions increased clonazepam to take 1 tab twice a day prn; do you want med list updated?

## 2014-10-10 ENCOUNTER — Telehealth: Payer: Self-pay | Admitting: Family Medicine

## 2014-10-10 NOTE — Telephone Encounter (Signed)
Please call patient.  He called yesterday about a refill.

## 2014-10-10 NOTE — Telephone Encounter (Signed)
Called to [facility] [address] 

## 2014-10-10 NOTE — Telephone Encounter (Signed)
Tim notified prescription has been called to [facility] [address]

## 2014-10-16 ENCOUNTER — Ambulatory Visit (INDEPENDENT_AMBULATORY_CARE_PROVIDER_SITE_OTHER): Payer: 59 | Admitting: Family Medicine

## 2014-10-16 ENCOUNTER — Ambulatory Visit (INDEPENDENT_AMBULATORY_CARE_PROVIDER_SITE_OTHER): Payer: 59

## 2014-10-16 VITALS — BP 108/74 | HR 94 | Temp 97.7°F | Resp 16 | Ht 67.0 in | Wt 199.4 lb

## 2014-10-16 DIAGNOSIS — R059 Cough, unspecified: Secondary | ICD-10-CM

## 2014-10-16 DIAGNOSIS — R05 Cough: Secondary | ICD-10-CM

## 2014-10-16 DIAGNOSIS — J209 Acute bronchitis, unspecified: Secondary | ICD-10-CM

## 2014-10-16 DIAGNOSIS — R062 Wheezing: Secondary | ICD-10-CM

## 2014-10-16 DIAGNOSIS — J329 Chronic sinusitis, unspecified: Secondary | ICD-10-CM

## 2014-10-16 MED ORDER — ALBUTEROL SULFATE (2.5 MG/3ML) 0.083% IN NEBU
2.5000 mg | INHALATION_SOLUTION | Freq: Once | RESPIRATORY_TRACT | Status: AC
  Administered 2014-10-16: 2.5 mg via RESPIRATORY_TRACT

## 2014-10-16 MED ORDER — AZITHROMYCIN 250 MG PO TABS: ORAL_TABLET | ORAL | Status: DC

## 2014-10-16 MED ORDER — ALBUTEROL SULFATE HFA 108 (90 BASE) MCG/ACT IN AERS: 2.0000 | INHALATION_SPRAY | Freq: Four times a day (QID) | RESPIRATORY_TRACT | Status: DC | PRN

## 2014-10-16 MED ORDER — IPRATROPIUM BROMIDE 0.02 % IN SOLN
0.5000 mg | Freq: Once | RESPIRATORY_TRACT | Status: AC
  Administered 2014-10-16: 0.5 mg via RESPIRATORY_TRACT

## 2014-10-16 MED ORDER — BENZONATATE 100 MG PO CAPS: 200.0000 mg | ORAL_CAPSULE | Freq: Two times a day (BID) | ORAL | Status: DC | PRN

## 2014-10-16 MED ORDER — HYDROCODONE-HOMATROPINE 5-1.5 MG/5ML PO SYRP: 5.0000 mL | ORAL_SOLUTION | Freq: Every evening | ORAL | Status: DC | PRN

## 2014-10-16 NOTE — Progress Notes (Signed)
Chief Complaint:  Chief Complaint  Patient presents with  . Sinusitis    x 4 days   . Bronchitis    x 4 days     HPI: Oscar Ruiz is a 38 y.o. male who is here for  1 week hx of sinus congestion and sinus sxs, his  Face hurts,  He has ear pain and also had bronchitis sxs that are lignering,  cough and clears up and then stops.  He has tried dayquil and nyquil but stopped due to worries about interference with his BP meds. He does not smoke as much right now. HX of alcohol abuse but no longer.   Past Medical History  Diagnosis Date  . Hypertension   . Seizures   . Substance abuse     tobacco and alcohol  . Anxiety    Past Surgical History  Procedure Laterality Date  . Hernia repair     History   Social History  . Marital Status: Married    Spouse Name: N/A    Number of Children: 2  . Years of Education: N/A   Occupational History  . plumbing    Social History Main Topics  . Smoking status: Current Every Day Smoker -- 1.00 packs/day    Types: Cigarettes  . Smokeless tobacco: Former NeurosurgeonUser  . 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 Drug Use: No  . Sexual Activity: Yes    Birth Control/ Protection: None   Other Topics Concern  . None   Social History Narrative   Regular exercise-yes   Diet: Fast food, skips meals   Family History  Problem Relation Age of Onset  . Diabetes Mother   . Alcohol abuse Father   . Hyperlipidemia Father   . Coronary artery disease Father   . Heart failure Father    No Known Allergies Prior to Admission medications   Medication Sig Start Date End Date Taking? Authorizing Provider  clonazePAM (KLONOPIN) 0.5 MG tablet TAKE 1 TABLET BY MOUTH BID PRN Anxiety 10/09/14  Yes Amy E Bedsole, MD  hydrochlorothiazide (HYDRODIURIL) 25 MG tablet Take 1 tablet (25 mg total) by mouth daily. 07/18/14  Yes Amy Michelle NasutiE Bedsole, MD  metoprolol succinate (TOPROL-XL) 50 MG 24 hr tablet Take 1 tablet (50 mg total) by  mouth daily. Take with or immediately following a meal. 08/21/14  Yes Donato SchultzMark Skains, MD     ROS: The patient denies fevers, chills, night sweats, unintentional weight loss, chest pain, palpitations, , dyspnea on exertion, nausea, vomiting, abdominal pain, dysuria, hematuria, melena, numbness, weakness, or tingling.   All other systems have been reviewed and were otherwise negative with the exception of those mentioned in the HPI and as above.    PHYSICAL EXAM: Filed Vitals:   10/16/14 1135  BP: 108/74  Pulse: 94  Temp: 97.7 F (36.5 C)  Resp: 16   Filed Vitals:   10/16/14 1135  Height: 5\' 7"  (1.702 m)  Weight: 199 lb 6.4 oz (90.447 kg)   Body mass index is 31.22 kg/(m^2).  General: Alert, no acute distress HEENT:  Normocephalic, atraumatic, oropharynx patent. EOMI, PERRLA  TM nl, erythematous throat, No exudates. + boggy nares, + sinus tenderness. Cardiovascular:  Regular rate and rhythm, no rubs murmurs or gallops.  Radial pulse intact. No pedal edema.  Respiratory: Clear to auscultation bilaterally.  + wheezes,No  rales, or rhonchi.  No cyanosis, no use of accessory musculature GI: No  organomegaly, abdomen is soft and non-tender, positive bowel sounds.  No masses. Skin: No rashes. Neurologic: Facial musculature symmetric. Psychiatric: Patient is appropriate throughout our interaction. Lymphatic: No cervical lymphadenopathy Musculoskeletal: Gait intact.   LABS:    EKG/XRAY:   [facility]. No acute cardiopulmonary disease   ASSESSMENT/PLAN: Encounter Diagnoses  Name Primary?  . Acute bronchitis, unspecified organism Yes  . Wheezing   . Cough    Felt better after nebs Rx albuterol inh, azithromycin, tessalon perles,hycodan Advise to stop smoking F/u prn   Gross sideeffects, risk and benefits, and alternatives of medications d/w patient. Patient is aware that all medications have potential sideeffects and we are unable to predict  every sideeffect or drug-drug interaction that may occur.  Oscar Tejada PHUONG, DO 10/16/2014 1:35 PM

## 2014-10-16 NOTE — Patient Instructions (Signed)

## 2015-02-09 ENCOUNTER — Emergency Department (HOSPITAL_COMMUNITY): Payer: 59

## 2015-02-09 ENCOUNTER — Encounter (HOSPITAL_COMMUNITY): Payer: Self-pay

## 2015-02-09 ENCOUNTER — Emergency Department (HOSPITAL_COMMUNITY)
Admission: EM | Admit: 2015-02-09 | Discharge: 2015-02-09 | Disposition: A | Discharge status: Home / Self Care | Payer: 59 | Attending: Emergency Medicine | Admitting: Emergency Medicine

## 2015-02-09 DIAGNOSIS — I10 Essential (primary) hypertension: Secondary | ICD-10-CM | POA: Insufficient documentation

## 2015-02-09 DIAGNOSIS — Z720 Tobacco use: Secondary | ICD-10-CM | POA: Diagnosis not present

## 2015-02-09 DIAGNOSIS — Y9389 Activity, other specified: Secondary | ICD-10-CM | POA: Insufficient documentation

## 2015-02-09 DIAGNOSIS — Z79899 Other long term (current) drug therapy: Secondary | ICD-10-CM | POA: Diagnosis not present

## 2015-02-09 DIAGNOSIS — F419 Anxiety disorder, unspecified: Secondary | ICD-10-CM | POA: Insufficient documentation

## 2015-02-09 DIAGNOSIS — S62309A Unspecified fracture of unspecified metacarpal bone, initial encounter for closed fracture: Secondary | ICD-10-CM

## 2015-02-09 DIAGNOSIS — Y9289 Other specified places as the place of occurrence of the external cause: Secondary | ICD-10-CM | POA: Insufficient documentation

## 2015-02-09 DIAGNOSIS — W228XXA Striking against or struck by other objects, initial encounter: Secondary | ICD-10-CM | POA: Diagnosis not present

## 2015-02-09 DIAGNOSIS — S62327A Displaced fracture of shaft of fifth metacarpal bone, left hand, initial encounter for closed fracture: Secondary | ICD-10-CM | POA: Insufficient documentation

## 2015-02-09 DIAGNOSIS — Y998 Other external cause status: Secondary | ICD-10-CM | POA: Diagnosis not present

## 2015-02-09 DIAGNOSIS — S6992XA Unspecified injury of left wrist, hand and finger(s), initial encounter: Secondary | ICD-10-CM | POA: Diagnosis present

## 2015-02-09 MED ORDER — OXYCODONE-ACETAMINOPHEN 5-325 MG PO TABS: 2.0000 | ORAL_TABLET | ORAL | Status: DC | PRN

## 2015-02-09 MED ORDER — OXYCODONE-ACETAMINOPHEN 5-325 MG PO TABS
2.0000 | ORAL_TABLET | Freq: Once | ORAL | Status: AC
  Administered 2015-02-09: 2 via ORAL
  Filled 2015-02-09: qty 2

## 2015-02-09 NOTE — ED Notes (Signed)
I think I broke a bone in my left hand when I punched a table per pt.

## 2015-02-09 NOTE — Discharge Instructions (Signed)
Hand Fracture, Fifth Metacarpal The small metacarpal is the bone at the base of the little finger between the knuckle and the wrist. A fracture is a break in that bone. One of the fractures that is common to this bone is called a Boxer's Fracture. TREATMENT These fractures can be treated with:   Reduction (bones moved back into place), then pinned through the skin to maintain the position, and then casted for about 6 weeks or as your caregiver determines necessary.  ORIF (open reduction and internal fixation) - the fracture site is opened and the bone pieces are fixed into place with pins and then casted for approximately 6 weeks or as your caregiver determines necessary. Your caregiver will discuss the type of fracture you have and the treatment that should be best for that problem. If surgery is the treatment of choice, the following is information for you to know, and also let your caregiver know about prior to surgery.  LET YOUR CAREGIVER KNOW ABOUT:  Allergies.  Medications taken including herbs, eye drops, over the counter medications, and creams.  Use of steroids (by mouth or creams).  Previous problems with anesthetics or novocaine.  Possibility of pregnancy, if this applies.  History of blood clots (thrombophlebitis).  History of bleeding or blood problems.  Previous surgery.  Other health problems. AFTER THE PROCEDURE After surgery, you will be taken to the recovery area where a nurse will watch and check your progress. Once you're awake, stable, and taking fluids well, barring other problems you'll be allowed to go home. Once home an ice pack applied to your operative site may help with discomfort and keep the swelling down. HOME CARE INSTRUCTIONS   Follow your caregiver's instructions as to activities, exercises, physical therapy, and driving a car.  Daily exercise is helpful for maintaining range of motion (movement and mobility) and strength. Exercise as  instructed.  To lessen swelling, keep the injured hand elevated above the level of your heart as much as possible.  Apply ice to the injury for 15-20 minutes each hour while awake for the first 2 days. Put the ice in a plastic bag and place a thin towel between the bag of ice and your cast.  Move the fingers of your casted hand at least several times a day.  If a plaster or fiberglass cast was applied:  Do not try to scratch the skin under the cast using a sharp or pointed object.  Check the skin around the cast every day. You may put lotion on red or sore areas.  Keep your cast dry. Your cast can be protected during bathing with a plastic bag. Do not put your cast into the water.  If a plaster splint was applied:  Wear the splint for as long as directed by your caregiver or until seen for follow-up examination.  Do not get your splint wet. Protect it during bathing with a plastic bag.  You may loosen the elastic bandage around the splint if your fingers start to get numb, tingle, get cold or turn blue.  Do not put pressure on your cast or splint; this may cause it to break. Especially, do not lean plaster casts on hard surfaces for 24 hours after application.  Take medications as directed by your caregiver.  Only take over-the-counter or prescription medicines for pain, discomfort, or fever as directed by your caregiver.  Follow all instructions for physician referrals, physical therapy, and rehabilitation. Any delay in obtaining necessary care could result in   permanent injury, disability and chronic pain. SEEK MEDICAL CARE IF:   Increased bleeding (more than a small spot) from the wound or from beneath your cast or splint if there is a wound beneath the cast from surgery.  Redness, swelling, or increasing pain in the wound or from beneath your cast or splint.  Pus coming from wound or from beneath your cast or splint.  An unexplained oral temperature above 102 F (38.9 C)  develops.  A foul smell coming from the wound or dressing or from beneath your cast or splint.  You are unable to move your little finger. SEEK IMMEDIATE MEDICAL CARE IF:  You develop a rash, have difficulty breathing, or have any allergy problems. If you do not have a window in your cast for observing the wound, a discharge or minor bleeding may show up as a stain on the outside of your cast. Report these findings to your caregiver. MAKE SURE YOU:   Understand these instructions.  Will watch your condition.  Will get help right away if you are not doing well or get worse. Document Released: 02/14/2001 Document Revised: 01/31/2012 Document Reviewed: 06/27/2008 ExitCare Patient Information 2015 [employer]. This information is not intended to replace advice given to you by your health care provider. Make sure you discuss any questions you have with your health care provider.  

## 2015-02-09 NOTE — ED Provider Notes (Signed)
CSN: [id number]     Arrival date & time 02/09/15  2037 History   First MD Initiated Contact with Patient 02/09/15 2105     Chief Complaint  Patient presents with  . Hand Injury     (Consider location/radiation/quality/duration/timing/severity/associated sxs/prior Treatment) Patient is a 39 y.o. male presenting with hand pain. The history is provided by the patient. No language interpreter was used.  Hand Pain This is a new problem. The current episode started today. The problem occurs constantly. The problem has been unchanged. Associated symptoms include joint swelling. Nothing aggravates the symptoms. He has tried nothing for the symptoms. The treatment provided moderate relief.  Pt complains of swelling and pain to left hand.   Pt hit a table  Past Medical History  Diagnosis Date  . Hypertension   . Seizures   . Substance abuse     tobacco and alcohol  . Anxiety    Past Surgical History  Procedure Laterality Date  . Hernia repair     Family History  Problem Relation Age of Onset  . Diabetes Mother   . Alcohol abuse Father   . Hyperlipidemia Father   . Coronary artery disease Father   . Heart failure Father    History  Substance Use Topics  . Smoking status: Current Every Day Smoker -- 1.00 packs/day    Types: Cigarettes  . Smokeless tobacco: Former Neurosurgeon  . Alcohol Use: Yes     Comment: alcoholic in remission 12/06/2013    Review of Systems  Musculoskeletal: Positive for joint swelling.  All other systems reviewed and are negative.     Allergies  Review of patient's allergies indicates no known allergies.  Home Medications   Prior to Admission medications   Medication Sig Start Date End Date Taking? Authorizing Provider  albuterol (PROVENTIL HFA;VENTOLIN HFA) 108 (90 BASE) MCG/ACT inhaler Inhale 2 puffs into the lungs every 6 (six) hours as needed for wheezing or shortness of breath. 10/16/14   Thao P Le, DO  azithromycin (ZITHROMAX) 250 MG tablet Take 2  tabs po now then 1 tab po daily for next 4 days 10/16/14   Thao P Le, DO  benzonatate (TESSALON) 100 MG capsule Take 2 capsules (200 mg total) by mouth 2 (two) times daily as needed. 10/16/14   Thao P Le, DO  clonazePAM (KLONOPIN) 0.5 MG tablet TAKE 1 TABLET BY MOUTH BID PRN Anxiety 10/09/14   Amy E Bedsole, MD  hydrochlorothiazide (HYDRODIURIL) 25 MG tablet Take 1 tablet (25 mg total) by mouth daily. 07/18/14   Amy Michelle Nasuti, MD  HYDROcodone-homatropine (HYCODAN) 5-1.5 MG/5ML syrup Take 5 mLs by mouth at bedtime as needed. 10/16/14   Thao P Le, DO  metoprolol succinate (TOPROL-XL) 50 MG 24 hr tablet Take 1 tablet (50 mg total) by mouth daily. Take with or immediately following a meal. 08/21/14   Jake Bathe, MD   BP 143/92 mmHg  Pulse 84  Temp(Src) 98.1 F (36.7 C) (Oral)  Resp 20  Ht  (1.727 m)  Wt 200 lb (90.719 kg)  BMI 30.42 kg/m2  SpO2 100% Physical Exam  Constitutional: He is oriented to person, place, and time. He appears well-developed and well-nourished.  HENT:  Head: Normocephalic.  Musculoskeletal: He exhibits tenderness.  Swollen dorsal left hand,  From,  nv and ns intact  Neurological: He is alert and oriented to person, place, and time. He has normal reflexes.  Skin: Skin is warm.  Psychiatric: He has a normal mood and  affect.  Nursing note and vitals reviewed.   ED Course  Procedures (including critical care time) Labs Review Labs Reviewed - No data to display  Imaging Review Dg Hand Complete Left  02/09/2015   CLINICAL DATA:  Punched coffee table today.  EXAM: LEFT HAND - COMPLETE 3+ VIEW  COMPARISON:  None.  FINDINGS: There is a fracture of the distal diaphysis of the fifth metacarpal with mild dorsal angulation and slight foreshortening. No radiopaque foreign body is evident.  IMPRESSION: Fifth metacarpal fracture, distal diaphysis   Electronically Signed   By: Ellery Plunkaniel R Mitchell M.D.   On: 02/09/2015 21:21     EKG Interpretation None      MDM    Final diagnoses:  Fracture, metacarpal, closed, initial encounter    Pt advised to follow up with Dr. Ignacia Palmahompson Ulna gutter splint Ice Percocet     Lonia SkinnerLeslie K ChatfieldSofia, PA-C 02/09/15 2244  Glynn OctaveStephen Rancour, MD 02/09/15 2337

## 2015-03-02 ENCOUNTER — Ambulatory Visit (INDEPENDENT_AMBULATORY_CARE_PROVIDER_SITE_OTHER): Payer: 59 | Admitting: Physician Assistant

## 2015-03-02 VITALS — BP 140/88 | HR 93 | Temp 98.5°F | Resp 20 | Ht 67.0 in | Wt 202.1 lb

## 2015-03-02 DIAGNOSIS — J309 Allergic rhinitis, unspecified: Secondary | ICD-10-CM

## 2015-03-02 DIAGNOSIS — J029 Acute pharyngitis, unspecified: Secondary | ICD-10-CM

## 2015-03-02 LAB — POCT RAPID STREP A (OFFICE): Rapid Strep A Screen: NEGATIVE

## 2015-03-02 MED ORDER — CETIRIZINE HCL 10 MG PO TABS: 10.0000 mg | ORAL_TABLET | Freq: Every day | ORAL | Status: DC

## 2015-03-02 MED ORDER — IPRATROPIUM BROMIDE 0.03 % NA SOLN: 2.0000 | Freq: Two times a day (BID) | NASAL | Status: DC

## 2015-03-02 MED ORDER — MAGIC MOUTHWASH W/LIDOCAINE: 10.0000 mL | ORAL | Status: DC | PRN

## 2015-03-02 NOTE — Patient Instructions (Signed)
You may use mouthwash every 2-3 hours as needed for throat pain. Use nasal spray twice a day. Take zyrtec daily. I will call you with the results of your lab tests. Return if not getting any better in 1-2 weeks.

## 2015-03-02 NOTE — Progress Notes (Signed)
Subjective:    Patient ID: Oscar Ruiz, male    DOB: 1976/03/20, 39 y.o.   MRN: [id number]  HPI  This is a 39 year old male who is presenting with headache and a "blister on his throat". Throat is sore with "white stuff on it" x 2 days. Feeling hot and cold on and off but no fever. Had a slight headache all day yesterday but this has resolved today. Denies nasal congestion or cough. States 3 days ago he started to have allergic symptoms while outside - sneezing and runny nose. The next day his current symptoms started. He is not taking anything for his symptoms or his allergies. He is a current every day smoker.  Review of Systems  Constitutional: Negative for fever and chills.  HENT: Positive for rhinorrhea and sore throat. Negative for congestion and ear pain.   Eyes: Negative for redness.  Respiratory: Negative for cough.   Gastrointestinal: Negative for nausea and vomiting.  Skin: Negative for rash.  Allergic/Immunologic: Positive for environmental allergies.  Hematological: Negative for adenopathy.    Patient Active Problem List   Diagnosis Date Noted  . Chest pain, unspecified 08/21/2014  . Sinus tachycardia 07/30/2014  . 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 Alcohol dependence in remission 12/03/2013  . 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/21/2013  . Generalized anxiety disorder 04/27/2013  . GOUT, UNSPECIFIED 01/06/2011  . HYPERLIPIDEMIA 05/07/2009  . PREDIABETES 05/07/2009  . LIVER FUNCTION TESTS, ABNORMAL 05/31/2008  . PANIC ATTACK 05/20/2008  . TOBACCO ABUSE 05/20/2008  . Essential hypertension, benign 05/20/2008  . LOW BACK PAIN, CHRONIC 01/02/2008   Prior to Admission medications   Medication Sig Start Date End Date Taking? Authorizing Provider  clonazePAM (KLONOPIN) 0.5 MG tablet TAKE 1 TABLET BY MOUTH BID PRN Anxiety 10/09/14  Yes Amy E Bedsole, MD  hydrochlorothiazide (HYDRODIURIL) 25 MG tablet Take 1 tablet (25 mg total) by mouth  daily. 07/18/14  Yes Amy Michelle Nasuti, MD  metoprolol succinate (TOPROL-XL) 50 MG 24 hr tablet Take 1 tablet (50 mg total) by mouth daily. Take with or immediately following a meal. 08/21/14  Yes Jake Bathe, MD  oxyCODONE-acetaminophen (ROXICET) 5-325 MG per tablet Take 2 tablets by mouth every 4 (four) hours as needed for severe pain. 02/09/15  Yes Lonia Skinner Sofia, PA-C  PARoxetine (PAXIL) 40 MG tablet Take 40 mg by mouth daily. 01/28/15  Yes Historical Provider, MD   No Known Allergies  Patient's social and family history were reviewed.     Objective:   Physical Exam  Constitutional: He is oriented to person, place, and time. He appears well-developed and well-nourished. No distress.  HENT:  Head: Normocephalic and atraumatic.  Right Ear: Hearing, tympanic membrane, external ear and ear canal normal.  Left Ear: Hearing, tympanic membrane, external ear and ear canal normal.  Nose: Nose normal. Right sinus exhibits no maxillary sinus tenderness and no frontal sinus tenderness. Left sinus exhibits no maxillary sinus tenderness and no frontal sinus tenderness.  Mouth/Throat: Uvula is midline and mucous membranes are normal. Posterior oropharyngeal erythema present. No oropharyngeal exudate or posterior oropharyngeal edema.  3-4 small areas of yellow discoloration under the skin on bilateral tonsils  Eyes: Conjunctivae and lids are normal. Right eye exhibits no discharge. Left eye exhibits no discharge. No scleral icterus.  Cardiovascular: Normal rate, regular rhythm, normal heart sounds and normal pulses.   No murmur heard. Pulmonary/Chest: Effort normal and breath sounds normal. No respiratory distress. He has no wheezes. He  has no rhonchi. He has no rales.  Musculoskeletal: Normal range of motion.  Lymphadenopathy:       Head (right side): No submental, no submandibular and no tonsillar adenopathy present.       Head (left side): No submental, no submandibular and no tonsillar adenopathy  present.    He has no cervical adenopathy.  Neurological: He is alert and oriented to person, place, and time.  Skin: Skin is warm, dry and intact. No lesion and no rash noted.  Psychiatric: He has a normal mood and affect. His speech is normal and behavior is normal. Thought content normal.   BP 140/88 mmHg  Pulse 93  Temp(Src) 98.5 F (36.9 C) (Oral)  Resp 20  Ht 5\' 7"  (1.702 m)  Wt 202 lb 2 oz (91.683 kg)  BMI 31.65 kg/m2  SpO2 97%   Results for orders placed or performed in visit on 03/02/15  POCT rapid strep A  Result Value Ref Range   Rapid Strep A Screen Negative Negative      Assessment & Plan:  1. Sore throat 2. Allergic rhinitis Sore throat etiology viral vs allergic rhinitis. He is not taking anything currently for his allergies. Zyrtec and atrovent prescribed. Mouthwash for sore throat. Rapid strep negative, culture pending. Yellow discoloration on tonsils likely d/t tonsil stones. He will return if symptoms are not improving in 1-2 weeks. - POCT rapid strep A - Culture, Group A Strep - Alum & Mag Hydroxide-Simeth (MAGIC MOUTHWASH W/LIDOCAINE) SOLN; Take 10 mLs by mouth every 2 (two) hours as needed for mouth pain.  Dispense: 360 mL; Refill: 0 - ipratropium (ATROVENT) 0.03 % nasal spray; Place 2 sprays into both nostrils 2 (two) times daily.  Dispense: 30 mL; Refill: 0 - cetirizine (ZYRTEC) 10 MG tablet; Take 1 tablet (10 mg total) by mouth daily.  Dispense: 30 tablet; Refill: 11   Cheryn Lundquist V. Dyke BrackettBush, PA-C, MHS [facility]  03/02/2015

## 2015-03-04 LAB — CULTURE, GROUP A STREP: ORGANISM ID, BACTERIA: NORMAL

## 2015-07-19 ENCOUNTER — Telehealth: Payer: Self-pay | Admitting: Family Medicine

## 2015-07-19 NOTE — Telephone Encounter (Signed)
Last office visit 08/29/2014.  AVS states to follow up in one month.  No future appointments scheduled.  Refill?

## 2015-07-21 NOTE — Telephone Encounter (Signed)
Pt will call back to schedule

## 2015-07-21 NOTE — Telephone Encounter (Signed)
Call to let pot know he will be due for CPX in 08/2015... No refills after this one without appt.

## 2015-07-21 NOTE — Telephone Encounter (Signed)
Please call and schedule CPE with fasting labs for Dr. Ermalene Searing in October.

## 2015-08-21 ENCOUNTER — Other Ambulatory Visit: Payer: Self-pay | Admitting: Cardiology

## 2015-09-18 IMAGING — CR DG CERVICAL SPINE COMPLETE 4+V
6 series · 6 of 6 positions shown · non-contrast
Comparison: None.

CLINICAL DATA: One week history of cervical radiculopathy.

EXAM:
CERVICAL SPINE  4+ VIEWS

[view not recorded (1 of 6)]
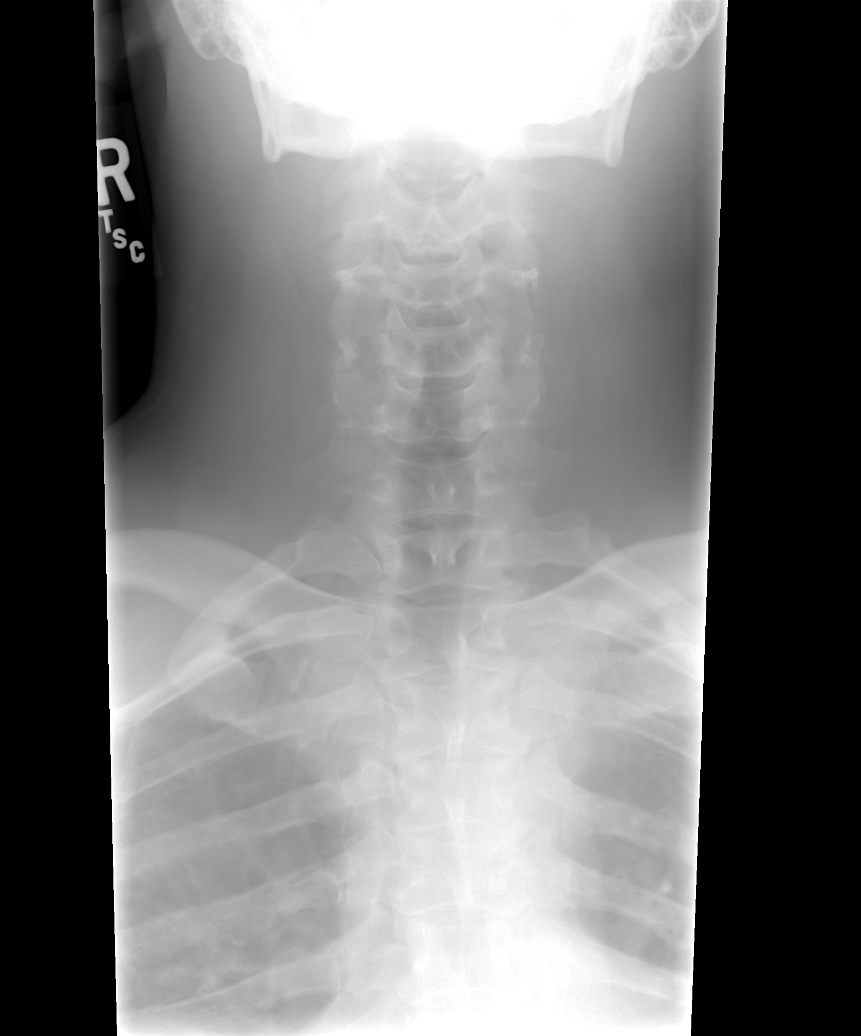

[view not recorded (2 of 6)]
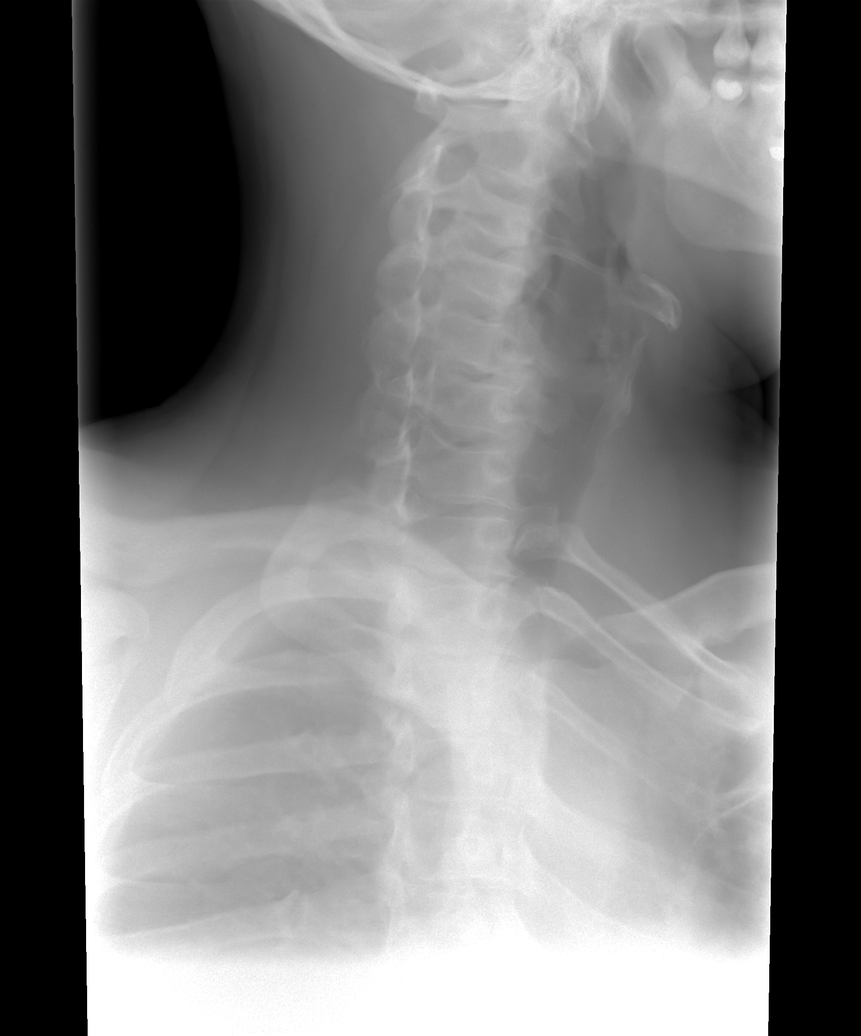

[view not recorded (3 of 6)]
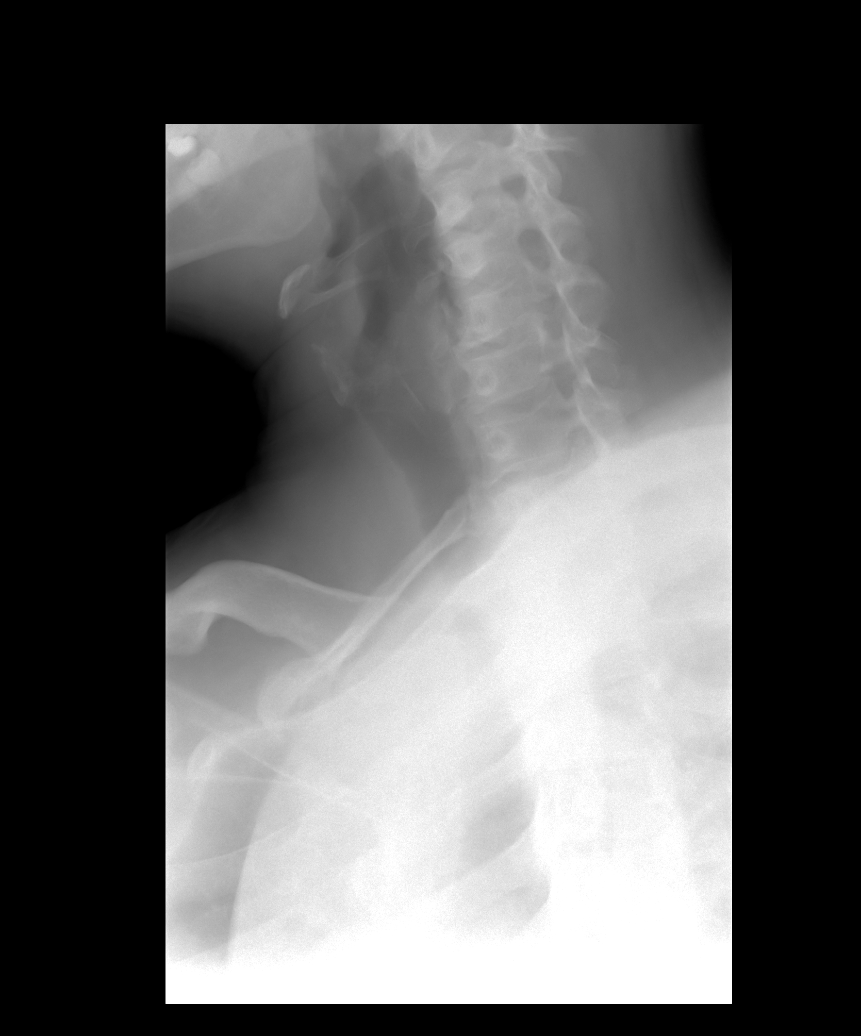

[view not recorded (4 of 6)]
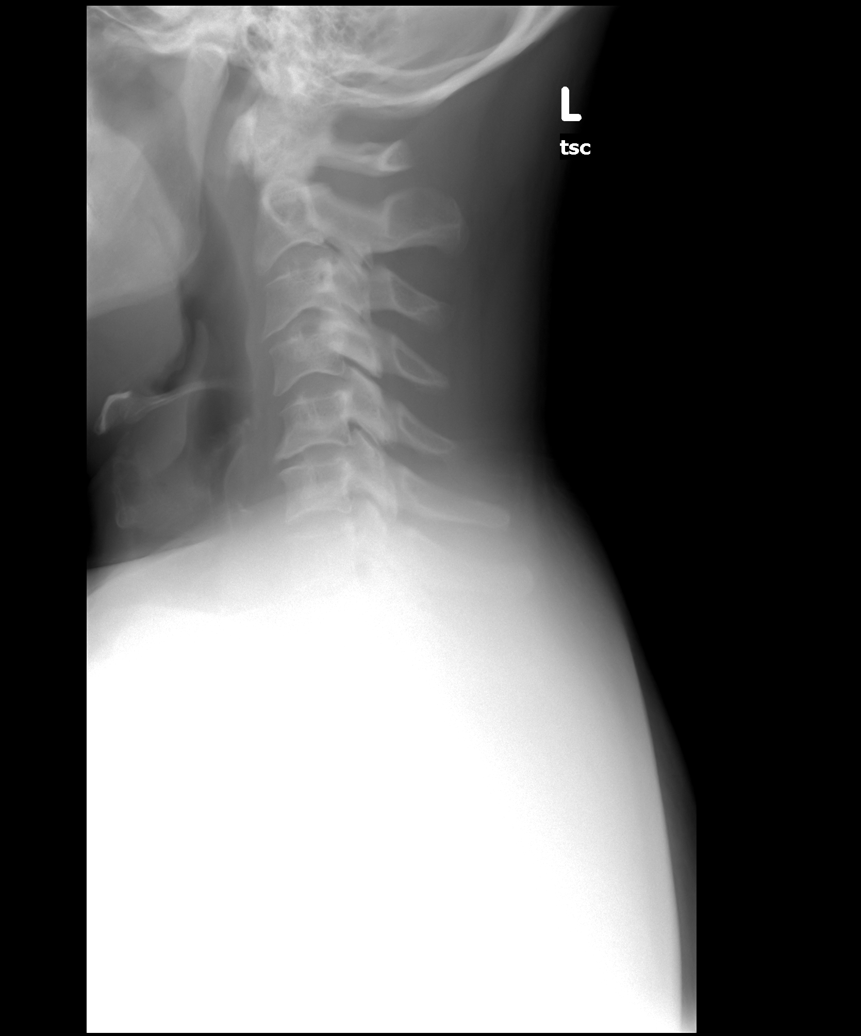

[view not recorded (5 of 6)]
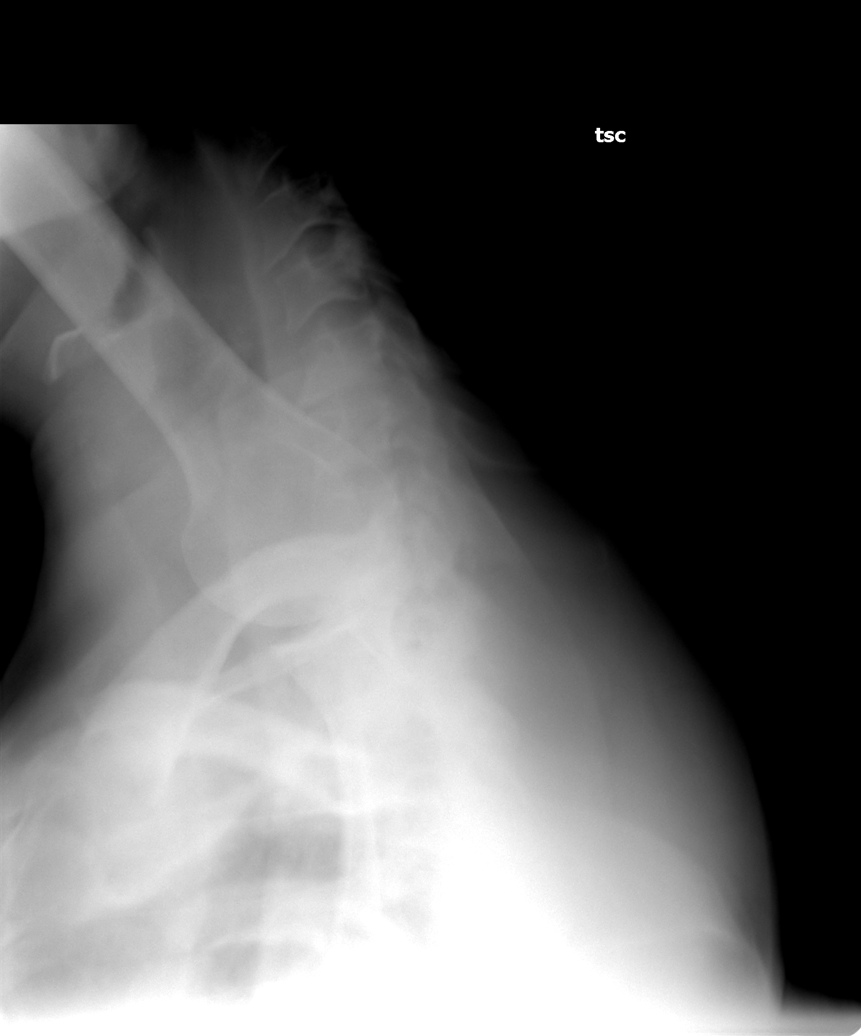

[view not recorded (6 of 6)]
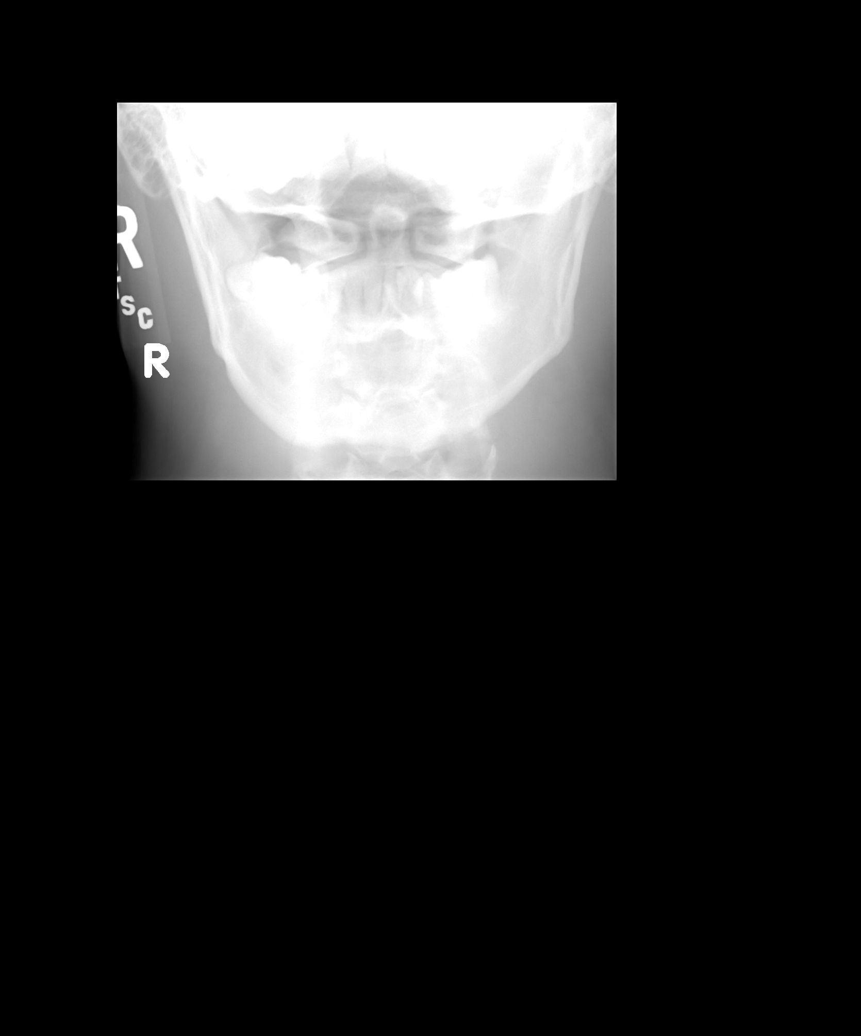

[6 of 6 positions shown; findings below may reference images not displayed]

FINDINGS: There is no evidence of prevertebral soft tissue swelling. On the
lateral image there is reversal of the normal cervical lordosis
which may be associated with muscle spasm. There is narrowing of the
intervertebral disc space at the level of C5-C6. Marginal osteophyte
formation is seen representing degenerative spondylosis. No
fracture, bony destruction, or dislocation is evident. No
significant subluxation is seen. There is slight foraminal
encroachment by uncovertebral spurring at the level of C5-C6 on the
left. No cervical ribs are evident.
IMPRESSION: On the lateral image there is reversal of the normal cervical
lordosis which may be associated with muscle spasm. There is
narrowing of the intervertebral disc space at the level of C5-C6.
Changes of degenerative spondylosis.

## 2015-09-19 ENCOUNTER — Other Ambulatory Visit: Payer: Self-pay | Admitting: Cardiology

## 2015-10-17 ENCOUNTER — Other Ambulatory Visit: Payer: Self-pay | Admitting: Cardiology

## 2015-11-04 ENCOUNTER — Other Ambulatory Visit: Payer: Self-pay | Admitting: *Deleted

## 2015-11-04 ENCOUNTER — Telehealth: Payer: Self-pay | Admitting: Cardiology

## 2015-11-04 MED ORDER — METOPROLOL SUCCINATE ER 50 MG PO TB24: ORAL_TABLET | ORAL | Status: DC

## 2015-11-04 NOTE — Telephone Encounter (Signed)
°*  STAT* If patient is at the pharmacy, call can be transferred to refill team.   1. Which medications need to be refilled? (please list name of each medication and dose if known) Metoprolol  2. Which pharmacy/location (including street and city if local pharmacy) is medication to be sent to? [facility] on [address]   3. Do they need a 30 day or 90 day supply? 30 Day   Pt already made an appt for 11/30/14 w/ Dr. Anne FuSkains

## 2015-11-08 ENCOUNTER — Other Ambulatory Visit: Payer: Self-pay | Admitting: Cardiology

## 2015-12-01 ENCOUNTER — Ambulatory Visit: Payer: Self-pay | Admitting: Cardiology

## 2015-12-02 NOTE — Progress Notes (Signed)
Cardiology Office Note    Date:  12/03/2015   ID:  JOURDAN DURBIN, DOB [birth year], MRN [id number]  PCP:  Kerby Nora, MD  Cardiologist:  Dr. Donato Schultz   Electrophysiologist:  n/a  Chief Complaint  Patient presents with  . Follow-up  . Tachycardia    History of Present Illness:  Oscar Ruiz is a 40 y.o. male with a hx of anxiety, alcohol abuse. Patient has a history of sinus tachycardia in the setting of panic disorder. He has been treated with beta blocker therapy. Last seen by Dr. Anne Fu 11/15.  Returns for follow-up.  Overall doing well. Here with his wife who notes that he snores and is fatigued.  She has witnessed apneic episodes as well.  He denies chest pain, dyspnea, syncope, orthopnea, PND, edema.  He continues to smoke and drink ETOH.  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   Past Medical History  Diagnosis Date  . Hypertension   . Seizures (HCC)   . Substance abuse     tobacco and alcohol  . Anxiety     Past Surgical History  Procedure Laterality Date  . Hernia repair      Current Outpatient Prescriptions  Medication Sig Dispense Refill  . clonazePAM (KLONOPIN) 0.5 MG tablet TAKE 1 TABLET BY MOUTH BID PRN Anxiety 30 tablet 0  . clonazePAM (KLONOPIN) 1 MG tablet Take 1 mg by mouth 2 (two) times daily as needed. For anxiety  5  . oxyCODONE-acetaminophen (ROXICET) 5-325 MG per tablet Take 2 tablets by mouth every 4 (four) hours as needed for severe pain. 16 tablet 0  . PARoxetine (PAXIL) 40 MG tablet Take 40 mg by mouth daily.  5  . metoprolol succinate (TOPROL-XL) 100 MG 24 hr tablet Take 1 tablet (100 mg total) by mouth daily. Take with or immediately following a meal. 90 tablet 3   No current facility-administered medications for this visit.    Allergies:   Review of patient's allergies indicates no known allergies.   Social History   Social History  . Marital Status: Married    Spouse Name: N/A  . Number of  Children: 2  . Years of Education: N/A   Occupational History  . plumbing    Social History Main Topics  . Smoking status: Current Every Day Smoker -- 1.00 packs/day    Types: Cigarettes  . Smokeless tobacco: Former Neurosurgeon  . 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 Drug Use: No  . Sexual Activity: Yes    Birth Control/ Protection: None   Other Topics Concern  . None   Social History Narrative   Regular exercise-yes   Diet: Fast food, skips meals     Family History:  The patient's family history includes Alcohol abuse in his father; Coronary artery disease in his father; Diabetes in his mother; Heart failure in his father; Hyperlipidemia in his father.   ROS:   Please see the history of present illness.    ROS All other systems reviewed and are negative.   PHYSICAL EXAM:   VS:  BP 138/94 mmHg  Pulse 74  Ht 5\' 8"  (1.727 m)  Wt 215 lb 6.4 oz (97.705 kg)  BMI 32.76 kg/m2   GEN: Well nourished, well developed, in no acute distress HEENT: normal Neck: no JVD, no masses Cardiac: Normal S1/S2, RRR; no murmurs, rubs, or gallops, no edema;  No carotid bruits,   Respiratory:  clear to auscultation bilaterally; no wheezing, rhonchi or rales GI: soft, nontender, nondistended, + BS MS: no deformity or atrophy Skin: warm and dry, no rash Neuro:  Bilateral strength equal, no focal deficits  Psych: Alert and oriented x 3, normal affect  Wt Readings from Last 3 Encounters:  12/03/15 215 lb 6.4 oz (97.705 kg)  03/02/15 202 lb 2 oz (91.683 kg)  02/09/15 200 lb (90.719 kg)      Studies/Labs Reviewed:   EKG:  EKG is  ordered today.  The ekg ordered today demonstrates NSR, HR 81, normal axis, no ST changes, QTc 411 ms  Recent Labs: No results found for requested labs within last 365 days.   Recent Lipid Panel    Component Value Date/Time   CHOL 229* 03/14/2014 1108   TRIG 245.0* 03/14/2014 1108   HDL 32.30* 03/14/2014 1108   CHOLHDL 7 03/14/2014  1108   VLDL 49.0* 03/14/2014 1108   LDLCALC 148* 03/14/2014 1108   LDLDIRECT 77.1 10/24/2013 1031    Additional studies/ records that were reviewed today include:   Echo 08/23/14 EF 55-60%, normal wall motion, normal diastolic function  ETT-echo 7/09 Normal   ASSESSMENT:    1. Essential hypertension   2. Sinus tachycardia (HCC)   3. Snoring   4. Tobacco abuse   5. Alcohol abuse     PLAN:  In order of problems listed above:  1. HTN - BP not controlled.  He stopped HCTZ b/c he works in [employer] and is always in the heat.  He notes his BP usually runs 150-160s.  He smokes, drinks caffeine and "a lot" of alcohol.  He notes Amlodipine caused tachycardia in the past.  He does snore and has symptoms of OSA.    -  Decrease caffeine, alcohol.  DC cigs.  -  Recommend sleep testing but he declines.  -  Increase Toprol-XL 100 mg QD  -  Check BP several times before returning.   -  Consider adding ACE inhibitor at FU if BP remains high.   2. Sinus Tachycardia - Controlled.  3. Snoring - Recommend split night sleep study.  He is not interested in scheduling.  We discussed the importance of treating sleep apnea (including controlling BP and preventing AFib).   4. Tobacco abuse - Recommend cessation.  5. ETOH Abuse - We discussed the importance of decreasing ETOH use.    Medication Adjustments/Labs and Tests Ordered: Current medicines are reviewed at length with the patient today.  Concerns regarding medicines are outlined above.  Medication changes, Labs and Tests ordered today are outlined in the Patient Instructions noted below.  Signed, Tereso NewcomerScott Ashlee Player, PA-C  12/03/2015 9:23 AM    [facility] [address], [place], [place]  [address] Phone: [phone]; Fax: [phone]   Patient Instructions  Medication Instructions:  1. INCREASE TOPROL XL TO 100 MG DAILY; NEW RX SENT IN FOR THE NEW  DOSE  Labwork: NONE  Testing/Procedures: NONE  Follow-Up: 01/14/16 @ 8:30 WITH Sabino Denning, PAC   Any Other Special Instructions Will Be Listed Below (If Applicable). CHECK BP 4-5 TIMES BEFORE YOUR NEXT APPT AND PLEASE BRING READINGS WITH YOU TO APPT ON 01/14/16  If you need a refill on your cardiac medications before your next appointment, please call your pharmacy.

## 2015-12-03 ENCOUNTER — Ambulatory Visit (INDEPENDENT_AMBULATORY_CARE_PROVIDER_SITE_OTHER): Payer: 59 | Admitting: Physician Assistant

## 2015-12-03 ENCOUNTER — Encounter: Payer: Self-pay | Admitting: Physician Assistant

## 2015-12-03 VITALS — BP 138/94 | HR 74 | Ht 68.0 in | Wt 215.4 lb

## 2015-12-03 DIAGNOSIS — R000 Tachycardia, unspecified: Secondary | ICD-10-CM

## 2015-12-03 DIAGNOSIS — Z720 Tobacco use: Secondary | ICD-10-CM | POA: Diagnosis not present

## 2015-12-03 DIAGNOSIS — R0683 Snoring: Secondary | ICD-10-CM

## 2015-12-03 DIAGNOSIS — I10 Essential (primary) hypertension: Secondary | ICD-10-CM | POA: Diagnosis not present

## 2015-12-03 DIAGNOSIS — F1010 Alcohol abuse, uncomplicated: Secondary | ICD-10-CM

## 2015-12-03 MED ORDER — METOPROLOL SUCCINATE ER 100 MG PO TB24: 100.0000 mg | ORAL_TABLET | Freq: Every day | ORAL | Status: DC

## 2015-12-03 NOTE — Patient Instructions (Addendum)
Medication Instructions:  1. INCREASE TOPROL XL TO 100 MG DAILY; NEW RX SENT IN FOR THE NEW DOSE  Labwork: NONE  Testing/Procedures: NONE  Follow-Up: 01/14/16 @ 8:30 WITH SCOTT WEAVER, PAC   Any Other Special Instructions Will Be Listed Below (If Applicable). CHECK BP 4-5 TIMES BEFORE YOUR NEXT APPT AND PLEASE BRING READINGS WITH YOU TO APPT ON 01/14/16  If you need a refill on your cardiac medications before your next appointment, please call your pharmacy.

## 2016-01-13 NOTE — Progress Notes (Signed)
Cardiology Office Note:    Date:  01/14/2016   ID:  CORDAI RODRIGUE, DOB [birth year], MRN [id number]  PCP:  Kerby Nora, MD  Cardiologist:  Dr. Donato Schultz   Electrophysiologist:  n/a  Chief Complaint  Patient presents with  . Follow-up    Hypertension    History of Present Illness:     Oscar Ruiz is a 40 y.o. male with a hx of anxiety, alcohol abuse. Patient has a history of sinus tachycardia in the setting of panic disorder. He has been treated with beta blocker therapy.  Last seen by me 12/03/15. Patient had stopped HCTZ due to concerns of dehydration (works in [employer] out in the heat). He also had intolerances to amlodipine. I increased his Toprol. I recommended a sleep test to assess for sleep apnea. However, he declined. He continued to report significant alcohol use.   Returns for FU.  The patient denies chest pain, shortness of breath, syncope, orthopnea, PND or significant pedal edema.    Past Medical History  Diagnosis Date  . Hypertension   . Seizures (HCC)   . Substance abuse     tobacco and alcohol  . Anxiety     Past Surgical History  Procedure Laterality Date  . Hernia repair      Current Medications: Outpatient Prescriptions Prior to Visit  Medication Sig Dispense Refill  . clonazePAM (KLONOPIN) 1 MG tablet Take 1 mg by mouth 2 (two) times daily as needed. For anxiety  5  . metoprolol succinate (TOPROL-XL) 100 MG 24 hr tablet Take 1 tablet (100 mg total) by mouth daily. Take with or immediately following a meal. 90 tablet 3  . oxyCODONE-acetaminophen (ROXICET) 5-325 MG per tablet Take 2 tablets by mouth every 4 (four) hours as needed for severe pain. 16 tablet 0  . PARoxetine (PAXIL) 40 MG tablet Take 40 mg by mouth daily.  5  . clonazePAM (KLONOPIN) 0.5 MG tablet TAKE 1 TABLET BY MOUTH BID PRN Anxiety (Patient not taking: Reported on 01/14/2016) 30 tablet 0   No facility-administered medications prior to visit.     Allergies:   Review of  patient's allergies indicates no known allergies.   Social History   Social History  . Marital Status: Married    Spouse Name: N/A  . Number of Children: 2  . Years of Education: N/A   Occupational History  . plumbing    Social History Main Topics  . Smoking status: Current Every Day Smoker -- 1.00 packs/day    Types: Cigarettes  . Smokeless tobacco: Former Neurosurgeon  . 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 Drug Use: No  . Sexual Activity: Yes    Birth Control/ Protection: None   Other Topics Concern  . None   Social History Narrative   Regular exercise-yes   Diet: Fast food, skips meals     Family History:  The patient's family history includes Alcohol abuse in his father; Coronary artery disease in his father; Diabetes in his mother; Heart failure in his father; Hyperlipidemia in his father.   ROS:   Please see the history of present illness.    ROS All other systems reviewed and are negative.   Physical Exam:    VS:  BP 122/82 mmHg  Pulse 80  Ht  (1.727 m)  Wt 220 lb (99.791 kg)  BMI 33.46 kg/m2   GEN: Well nourished, well developed, in no acute distress HEENT: normal Neck:  no JVD, no masses Cardiac: Normal S1/S2, RRR; no murmurs,  no edema   Respiratory:  clear to auscultation bilaterally; no wheezing, rhonchi or rales GI: soft, nontender  MS: no deformity  Skin: warm and dry  Neuro:   no focal deficits  Psych: Alert and oriented x 3, normal affect  Wt Readings from Last 3 Encounters:  01/14/16 220 lb (99.791 kg)  12/03/15 215 lb 6.4 oz (97.705 kg)  03/02/15 202 lb 2 oz (91.683 kg)      Studies/Labs Reviewed:     EKG:  EKG is  ordered today.  The ekg ordered today demonstrates NSR, HR 79, normal axis, QTC 421 ms, no change from prior tracing  Recent Labs: No results found for requested labs within last 365 days.   Recent Lipid Panel    Component Value Date/Time   CHOL 229* 03/14/2014 1108   TRIG 245.0*  03/14/2014 1108   HDL 32.30* 03/14/2014 1108   CHOLHDL 7 03/14/2014 1108   VLDL 49.0* 03/14/2014 1108   LDLCALC 148* 03/14/2014 1108   LDLDIRECT 77.1 10/24/2013 1031    Additional studies/ records that were reviewed today include:   Echo 08/23/14 EF 55-60%, normal wall motion, normal diastolic function  ETT-echo 7/09 Normal   ASSESSMENT:     1. Essential hypertension   2. Snoring     PLAN:     In order of problems listed above:  1. HTN - His blood pressure is much better controlled. Continue current therapy. I have asked him to keep an eye on his blood pressures and let us know if they are running high. I have reminded him to decrease salt intake as well as to reduce alcohol use. I have also asked him to stop smoking. He continues to decline sleep testing.  2. Snoring -  He has refused sleep testing.    Medication Adjustments/Labs and Tests Ordered: Current medicines are reviewed at length with the patient today.  Concerns regarding medicines are outlined above.  Medication changes, Labs and Tests ordered today are outlined in the Patient Instructions noted below. Patient Instructions  Medication Instructions:  No changes.  See your medication list.  Labwork: None today   Testing/Procedures: None   Follow-Up: Dr. Donato Schultz or Tereso Newcomer, PA-C in 6 months.   Any Other Special Instructions Will Be Listed Below (If Applicable). Try to check your blood pressure periodically (3-5 times a week).  Call if your BP tends to be 140/90 or higher. Quit smoking. Decrease alcohol use. Watch salt in your diet (fast food, frozen dinners, processed meats, canned vegetable).  Try to eat baked or broiled chicken, fish.  Try to eat fresh or frozen vegetables.  Try to eat more fruit and vegetables. Try to lose 10-15 pounds.  If you need a refill on your cardiac medications before your next appointment, please call your pharmacy.    Signed, Tereso Newcomer, PA-C    01/14/2016 8:57 AM    [facility] [address] [place], [place], [place]  [address] Phone: [phone]; Fax: [phone]

## 2016-01-14 ENCOUNTER — Encounter: Payer: Self-pay | Admitting: Physician Assistant

## 2016-01-14 ENCOUNTER — Ambulatory Visit (INDEPENDENT_AMBULATORY_CARE_PROVIDER_SITE_OTHER): Payer: 59 | Admitting: Physician Assistant

## 2016-01-14 VITALS — BP 122/82 | HR 80 | Ht 68.0 in | Wt 220.0 lb

## 2016-01-14 DIAGNOSIS — I10 Essential (primary) hypertension: Secondary | ICD-10-CM

## 2016-01-14 DIAGNOSIS — F1021 Alcohol dependence, in remission: Secondary | ICD-10-CM

## 2016-01-14 DIAGNOSIS — R000 Tachycardia, unspecified: Secondary | ICD-10-CM

## 2016-01-14 DIAGNOSIS — R0683 Snoring: Secondary | ICD-10-CM | POA: Diagnosis not present

## 2016-01-14 DIAGNOSIS — F17200 Nicotine dependence, unspecified, uncomplicated: Secondary | ICD-10-CM | POA: Diagnosis not present

## 2016-01-14 NOTE — Patient Instructions (Signed)
Medication Instructions:  No changes.  See your medication list.  Labwork: None today   Testing/Procedures: None   Follow-Up: Dr. Donato Schultz or Tereso Newcomer, PA-C in 6 months.   Any Other Special Instructions Will Be Listed Below (If Applicable). Try to check your blood pressure periodically (3-5 times a week).  Call if your BP tends to be 140/90 or higher. Quit smoking. Decrease alcohol use. Watch salt in your diet (fast food, frozen dinners, processed meats, canned vegetable).  Try to eat baked or broiled chicken, fish.  Try to eat fresh or frozen vegetables.  Try to eat more fruit and vegetables. Try to lose 10-15 pounds.  If you need a refill on your cardiac medications before your next appointment, please call your pharmacy.

## 2016-10-08 ENCOUNTER — Telehealth: Payer: Self-pay | Admitting: Cardiology

## 2016-10-08 NOTE — Telephone Encounter (Signed)
I am  Not in [place]; Dr Antoine PocheHochrein is Oscar MillersBrian Alantis Ruiz

## 2016-10-08 NOTE — Telephone Encounter (Signed)
Pt calling to ask for a "transfer" from [facility] in [place] to [facility] in [place],  Only because he moved and the [place] office is closer.

## 2016-10-11 NOTE — Telephone Encounter (Signed)
Ok with me Frandy Basnett, MD  

## 2017-01-20 ENCOUNTER — Telehealth: Payer: Self-pay | Admitting: Cardiology

## 2017-01-20 ENCOUNTER — Other Ambulatory Visit: Payer: Self-pay | Admitting: *Deleted

## 2017-01-20 DIAGNOSIS — R000 Tachycardia, unspecified: Secondary | ICD-10-CM

## 2017-01-20 DIAGNOSIS — I10 Essential (primary) hypertension: Secondary | ICD-10-CM

## 2017-01-20 MED ORDER — METOPROLOL SUCCINATE ER 100 MG PO TB24: 100.0000 mg | ORAL_TABLET | Freq: Every day | ORAL | 0 refills | Status: DC

## 2017-01-20 NOTE — Telephone Encounter (Signed)
New message     *STAT* If patient is at the pharmacy, call can be transferred to refill team.   1. Which medications need to be refilled? (please list name of each medication and dose if known) metoprolol 100 mg   2. Which pharmacy/location (including street and city if local pharmacy) is medication to be sent to? [facility] in [place]  3. Do they need a 30 day or 90 day supply? 30 day

## 2017-02-03 ENCOUNTER — Encounter: Payer: Self-pay | Admitting: Physician Assistant

## 2017-02-14 ENCOUNTER — Ambulatory Visit: Payer: Self-pay | Admitting: Physician Assistant

## 2017-02-15 ENCOUNTER — Ambulatory Visit: Payer: Self-pay | Admitting: Physician Assistant

## 2017-02-21 ENCOUNTER — Encounter: Payer: Self-pay | Admitting: Cardiology

## 2017-02-21 ENCOUNTER — Ambulatory Visit (INDEPENDENT_AMBULATORY_CARE_PROVIDER_SITE_OTHER): Payer: 59 | Admitting: Cardiology

## 2017-02-21 VITALS — BP 136/86 | HR 80 | Ht 68.0 in | Wt 238.8 lb

## 2017-02-21 DIAGNOSIS — E785 Hyperlipidemia, unspecified: Secondary | ICD-10-CM | POA: Diagnosis not present

## 2017-02-21 DIAGNOSIS — R000 Tachycardia, unspecified: Secondary | ICD-10-CM

## 2017-02-21 DIAGNOSIS — I10 Essential (primary) hypertension: Secondary | ICD-10-CM

## 2017-02-21 MED ORDER — METOPROLOL SUCCINATE ER 100 MG PO TB24: 100.0000 mg | ORAL_TABLET | Freq: Every day | ORAL | 11 refills | Status: DC

## 2017-02-21 NOTE — Progress Notes (Signed)
Cardiology Office Note   Date:  02/21/2017   ID:  Oscar Ruiz, DOB 1976-03-29, MRN [id number]  PCP:  Kerby Nora, MD  Cardiologist:  Dr. Anne Fu    Chief Complaint  Patient presents with  . Hypertension      History of Present Illness: Oscar Ruiz is a 41 y.o. male who presents for HTN.  Since his last visit he has gone through rehab for alcohol and has been dry for 7 months.  He continues to smoke and his wt has increased due to eating ice cream.  We discussed and he states one thing at a time.  I did encourage exercise if only 10 min per day walking.      Pt has a hx of history of sinus tachycardia in the setting of panic disorder. He has been treated with beta blocker therapy --other hx of anxiety and alcohol use- see above.    He denies chest pain and SOB.  No edema.   He is still not interested in sleep study.  He has not taken his BP pill today. On last visit pt had stopped HCTZ, BP today elevated but he did not take meds. No further episodes of tachycardia.   Past Medical History:  Diagnosis Date  . Anxiety   . Hypertension   . Seizures (HCC)   . Substance abuse    tobacco and alcohol    Past Surgical History:  Procedure Laterality Date  . HERNIA REPAIR       Current Outpatient Prescriptions  Medication Sig Dispense Refill  . clonazePAM (KLONOPIN) 1 MG tablet Take 1 mg by mouth 2 (two) times daily as needed. For anxiety  5  . metoprolol succinate (TOPROL-XL) 100 MG 24 hr tablet Take 1 tablet (100 mg total) by mouth daily. Take with or immediately following a meal. 30 tablet 11  . PARoxetine (PAXIL) 40 MG tablet Take 40 mg by mouth daily.  5   No current facility-administered medications for this visit.     Allergies:   Patient has no known allergies.    Social History:  The patient  reports that he has been smoking Cigarettes.  He has been smoking about 1.00 pack per day. He has quit using smokeless tobacco. He reports that he does not drink  alcohol or use drugs.   Family History:  The patient's family history includes Alcohol abuse in his father; Coronary artery disease in his father; Diabetes in his mother; Heart failure in his father; Hyperlipidemia in his father.    ROS:  General:no colds or fevers, + weight gain Skin:no rashes or ulcers HEENT:no blurred vision, no congestion CV:see HPI PUL:see HPI GI:no diarrhea constipation or melena, no indigestion GU:no hematuria, no dysuria MS:no joint pain, no claudication Neuro:no syncope, no lightheadedness Endo:no diabetes, no thyroid disease  Wt Readings from Last 3 Encounters:  02/21/17 238 lb 12.8 oz (108.3 kg)  01/14/16 220 lb (99.8 kg)  12/03/15 215 lb 6.4 oz (97.7 kg)     PHYSICAL EXAM: VS:  BP 136/86   Pulse 80   Ht  (1.727 m)   Wt 238 lb 12.8 oz (108.3 kg)   BMI 36.31 kg/m  , BMI Body mass index is 36.31 kg/m. General:Pleasant affect, NAD Skin:Warm and dry, brisk capillary refill HEENT:normocephalic, sclera clear, mucus membranes moist Neck:supple, no JVD, no bruits  Heart:S1S2 RRR without murmur, gallup, rub or click Lungs:clear without rales, rhonchi, or wheezes VFI:EPPI, non tender, + BS, do not palpate  liver spleen or masses Ext:no lower ext edema, 2+ pedal pulses, 2+ radial pulses Neuro:alert and oriented, MAE, follows commands, + facial symmetry    EKG:  EKG is ordered today. The ekg ordered today demonstrates SR no changes on EKG.    Recent Labs: No results found for requested labs within last 8760 hours.    Lipid Panel    Component Value Date/Time   CHOL 229 (H) 03/14/2014 1108   TRIG 245.0 (H) 03/14/2014 1108   HDL 32.30 (L) 03/14/2014 1108   CHOLHDL 7 03/14/2014 1108   VLDL 49.0 (H) 03/14/2014 1108   LDLCALC 148 (H) 03/14/2014 1108   LDLDIRECT 77.1 10/24/2013 1031       Other studies Reviewed: Additional studies/ records that were reviewed today include: . ECHO: Study Conclusions  - Left ventricle: The cavity size  was normal. Systolic function was normal. The estimated ejection fraction was in the range of 55% to 60%. Wall motion was normal; there were no regional wall motion abnormalities. Left ventricular diastolic function parameters were normal.  ASSESSMENT AND PLAN:  1.  HTN fairly controlled buit he did not take today.  He will call in BP check in 2 weeks on medication.  Refilled metoprolol.   2. HLD, hx of fenofibrate will check CMP and Lipids   Pt will follow up in 1 year with Dr. Anne Fu, pt continues to want to be seen in [place].  We discussed his PCP, gave option he could be followed by PCP and only see [place] if problem, for now he would like to continue to see [place].   Current medicines are reviewed with the patient today.  The patient Has no concerns regarding medicines.  The following changes have been made:  See above Labs/ tests ordered today include:see above  Disposition:   FU:  see above  Signed, Nada Boozer, NP  02/21/2017 4:26 PM    [facility] [address] [place], [place], [place]  [address]/ [address] [place], [place] Phone: [phone]; Fax: [phone]  [phone]

## 2017-02-21 NOTE — Patient Instructions (Signed)
Medication Instructions:  A REFILL WAS SENT IN FOR METOPROLOL  Labwork: TODAY FASTING LIPID AND CMET  Testing/Procedures: NONE ORDERED  Follow-Up: Your physician wants you to follow-up in: 1 YEAR WITH DR. Anne Fu You will receive a reminder letter in the mail two months in advance. If you don't receive a letter, please call our office to schedule the follow-up appointment.   Any Other Special Instructions Will Be Listed Below (If Applicable).     If you need a refill on your cardiac medications before your next appointment, please call your pharmacy.

## 2017-02-22 ENCOUNTER — Telehealth: Payer: Self-pay | Admitting: Cardiology

## 2017-02-22 DIAGNOSIS — Z79899 Other long term (current) drug therapy: Secondary | ICD-10-CM

## 2017-02-22 LAB — COMPREHENSIVE METABOLIC PANEL
ALK PHOS: 77 IU/L (ref 39–117)
ALT: 66 IU/L — ABNORMAL HIGH (ref 0–44)
AST: 48 IU/L — AB (ref 0–40)
Albumin/Globulin Ratio: 2 (ref 1.2–2.2)
Albumin: 4.5 g/dL (ref 3.5–5.5)
BILIRUBIN TOTAL: 0.4 mg/dL (ref 0.0–1.2)
BUN / CREAT RATIO: 13 (ref 9–20)
BUN: 13 mg/dL (ref 6–24)
CO2: 25 mmol/L (ref 18–29)
CREATININE: 1.02 mg/dL (ref 0.76–1.27)
Calcium: 9.6 mg/dL (ref 8.7–10.2)
Chloride: 98 mmol/L (ref 96–106)
GFR calc non Af Amer: 92 mL/min/{1.73_m2} (ref >59)
GFR, EST AFRICAN AMERICAN: 106 mL/min/{1.73_m2} (ref >59)
GLUCOSE: 111 mg/dL — AB (ref 65–99)
Globulin, Total: 2.2 g/dL (ref 1.5–4.5)
Potassium: 4.2 mmol/L (ref 3.5–5.2)
SODIUM: 138 mmol/L (ref 134–144)
Total Protein: 6.7 g/dL (ref 6.0–8.5)

## 2017-02-22 LAB — LIPID PANEL
Chol/HDL Ratio: 11.6 ratio — ABNORMAL HIGH (ref 0.0–5.0)
Cholesterol, Total: 266 mg/dL — ABNORMAL HIGH (ref 100–199)
HDL: 23 mg/dL — AB (ref >39)
Triglycerides: 641 mg/dL (ref 0–149)

## 2017-02-22 NOTE — Telephone Encounter (Signed)
New message  ° ° ° °Pt is returning call to Jennifer about results. °

## 2017-02-22 NOTE — Telephone Encounter (Signed)
-----   Message from Leone Brand, NP sent at 02/22/2017  8:04 AM EDT ----- His TG are very elevated, puts him at risk for pancreatitis.  Decrease sugar in diet, decrease carbs and walk for exercise.  Recheck lipid and lfts in 2 months.

## 2017-04-25 ENCOUNTER — Other Ambulatory Visit: Payer: Self-pay

## 2017-11-06 ENCOUNTER — Emergency Department (HOSPITAL_COMMUNITY)
Admission: EM | Admit: 2017-11-06 | Discharge: 2017-11-06 | Disposition: A | Discharge status: Home / Self Care | Payer: 59 | Attending: Emergency Medicine | Admitting: Emergency Medicine

## 2017-11-06 ENCOUNTER — Encounter (HOSPITAL_COMMUNITY): Payer: Self-pay | Admitting: Emergency Medicine

## 2017-11-06 DIAGNOSIS — Z79899 Other long term (current) drug therapy: Secondary | ICD-10-CM | POA: Insufficient documentation

## 2017-11-06 DIAGNOSIS — I10 Essential (primary) hypertension: Secondary | ICD-10-CM | POA: Insufficient documentation

## 2017-11-06 DIAGNOSIS — K0889 Other specified disorders of teeth and supporting structures: Secondary | ICD-10-CM | POA: Insufficient documentation

## 2017-11-06 DIAGNOSIS — F17210 Nicotine dependence, cigarettes, uncomplicated: Secondary | ICD-10-CM | POA: Insufficient documentation

## 2017-11-06 MED ORDER — OXYCODONE-ACETAMINOPHEN 5-325 MG PO TABS
2.0000 | ORAL_TABLET | Freq: Once | ORAL | Status: AC
  Administered 2017-11-06: 2 via ORAL
  Filled 2017-11-06: qty 2

## 2017-11-06 MED ORDER — OXYCODONE-ACETAMINOPHEN 5-325 MG PO TABS: 1.0000 | ORAL_TABLET | Freq: Once | ORAL | Status: DC

## 2017-11-06 NOTE — ED Provider Notes (Signed)
[facility] EMERGENCY DEPARTMENT Provider Note   CSN: [id number] Arrival date & time: 11/06/17  1957     History   Chief Complaint Chief Complaint  Patient presents with  . Dental Pain    HPI Oscar Ruiz is a 41 y.o. male presenting for evaluation of acute onset dental pain.  He reports his right upper central incisor has a chronic intermittent problem with pain secondary to food particles getting trapped between the tooth and his gingiva.  This occurred again this evening and with severity of pain he attempted to pull the tooth at he reports it is loose at baseline anyway.  His pain became more severe, he contacted his dentist who will see him tomorrow morning but in the interim was advised to seek care here for pain relief.  He denies fevers or chills, there is been no drainage from around the tooth and no gingival swelling.  The history is provided by the patient and the spouse.    Past Medical History:  Diagnosis Date  . Anxiety   . Hypertension   . Seizures (HCC)   . Substance abuse (HCC)    tobacco and alcohol    Patient Active Problem List   Diagnosis Date Noted  . Sinus tachycardia 07/30/2014  . 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 Alcohol dependence in remission (HCC) 12/03/2013  . 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/21/2013  . Generalized anxiety disorder 04/27/2013  . GOUT, UNSPECIFIED 01/06/2011  . HYPERLIPIDEMIA 05/07/2009  . PREDIABETES 05/07/2009  . LIVER FUNCTION TESTS, ABNORMAL 05/31/2008  . PANIC ATTACK 05/20/2008  . TOBACCO ABUSE 05/20/2008  . Essential hypertension, benign 05/20/2008  . LOW BACK PAIN, CHRONIC 01/02/2008    Past Surgical History:  Procedure Laterality Date  . HERNIA REPAIR         Home Medications    Prior to Admission medications   Medication Sig Start Date End Date Taking? Authorizing Provider  clonazePAM (KLONOPIN) 1 MG tablet Take 1 mg by mouth 2 (two) times daily as needed. For anxiety  11/14/15   [provider]  metoprolol succinate (TOPROL-XL) 100 MG 24 hr tablet Take 1 tablet (100 mg total) by mouth daily. Take with or immediately following a meal. 02/21/17   Leone BrandIngold, Laura R, NP  PARoxetine (PAXIL) 40 MG tablet Take 40 mg by mouth daily. 01/28/15   [provider]    Family History Family History  Problem Relation Age of Onset  . Diabetes Mother   . Alcohol abuse Father   . Hyperlipidemia Father   . Coronary artery disease Father   . Heart failure Father     Social History Social History   Tobacco Use  . Smoking status: Current Every Day Smoker    Packs/day: 1.00    Types: Cigarettes  . Smokeless tobacco: Former Engineer, waterUser  Substance Use Topics  . 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 Drug use: No     Allergies   Patient has no known allergies.   Review of Systems Review of Systems  Constitutional: Negative for fever.  HENT: Positive for dental problem. Negative for facial swelling and sore throat.   Respiratory: Negative for shortness of breath.   Musculoskeletal: Negative for neck pain and neck stiffness.     Physical Exam Updated Vital Signs BP (!) 178/86 (BP Location: Right Arm)   Pulse 79   Temp 98.2 F (36.8 C) (Temporal)   Resp 20   Ht 5\' 8"  (  1.727 m)   Wt 108 kg (238 lb)   SpO2 96%   BMI 36.19 kg/m   Physical Exam  Constitutional: He is oriented to person, place, and time. He appears well-developed and well-nourished. No distress.  HENT:  Head: Normocephalic and atraumatic.  Right Ear: Tympanic membrane and external ear normal.  Left Ear: Tympanic membrane and external ear normal.  Mouth/Throat: Oropharynx is clear and moist and mucous membranes are normal. No oral lesions. No trismus in the jaw. Abnormal dentition. No dental abscesses or dental caries.    Eyes: Conjunctivae are normal.  Neck: Normal range of motion. Neck supple.  Cardiovascular: Normal rate and normal heart sounds.    Pulmonary/Chest: Effort normal.  Abdominal: He exhibits no distension.  Musculoskeletal: Normal range of motion.  Lymphadenopathy:    He has no cervical adenopathy.  Neurological: He is alert and oriented to person, place, and time.  Skin: Skin is warm and dry. No erythema.  Psychiatric: He has a normal mood and affect.     ED Treatments / Results  Labs (all labs ordered are listed, but only abnormal results are displayed) Labs Reviewed - No data to display  EKG  EKG Interpretation None       Radiology No results found.  Procedures Procedures (including critical care time)  Medications Ordered in ED Medications  oxyCODONE-acetaminophen (PERCOCET/ROXICET) 5-325 MG per tablet 2 tablet (2 tablets Oral Given 11/06/17 2118)     Initial Impression / Assessment and Plan / ED Course  I have reviewed the triage vital signs and the nursing notes.  Pertinent labs & imaging results that were available during my care of the patient were reviewed by me and considered in my medical decision making (see chart for details).     Patient is scheduled to see his dentist in the morning at 8 AM.  He was given oxycodone 2 tablets now for pain relief.  Advised patient to keep his appointment with his dentist.  Wife is driving home.  The patient appears reasonably screened and/or stabilized for discharge and I doubt any other medical condition or other [facility] requiring further screening, evaluation, or treatment in the ED at this time prior to discharge.   Final Clinical Impressions(s) / ED Diagnoses   Final diagnoses:  Pain, dental    ED Discharge Orders    None       Victoriano Laindol, Karole Oo, PA-C 11/06/17 2119    Doug SouJacubowitz, Sam, MD 11/07/17 [phone]

## 2017-11-06 NOTE — ED Triage Notes (Signed)
Pt with front tooth pain. Spoke with his dentist who stated he couldn't call him in anything for pain and recommended that he come here for treatment.

## 2017-11-06 NOTE — Discharge Instructions (Signed)
You have received narcotic pain treatment this evening.  Do not drive within 4 hours of taking this medication.  Plan to keep your dental appointment tomorrow morning at 8 AM.

## 2018-03-13 ENCOUNTER — Other Ambulatory Visit: Payer: Self-pay | Admitting: Cardiology

## 2018-03-13 DIAGNOSIS — R000 Tachycardia, unspecified: Secondary | ICD-10-CM

## 2018-03-13 DIAGNOSIS — I10 Essential (primary) hypertension: Secondary | ICD-10-CM

## 2018-06-15 ENCOUNTER — Ambulatory Visit: Payer: 59 | Admitting: General Surgery

## 2018-10-11 ENCOUNTER — Other Ambulatory Visit: Payer: Self-pay

## 2018-10-11 ENCOUNTER — Ambulatory Visit (INDEPENDENT_AMBULATORY_CARE_PROVIDER_SITE_OTHER): Payer: 59 | Admitting: Physician Assistant

## 2018-10-11 ENCOUNTER — Encounter: Payer: Self-pay | Admitting: Physician Assistant

## 2018-10-11 DIAGNOSIS — F411 Generalized anxiety disorder: Secondary | ICD-10-CM | POA: Diagnosis not present

## 2018-10-11 MED ORDER — PAROXETINE HCL 40 MG PO TABS: 40.0000 mg | ORAL_TABLET | Freq: Every day | ORAL | 5 refills | Status: DC

## 2018-10-11 MED ORDER — GABAPENTIN 600 MG PO TABS: 600.0000 mg | ORAL_TABLET | Freq: Three times a day (TID) | ORAL | 5 refills | Status: DC

## 2018-10-11 MED ORDER — CLONAZEPAM 1 MG PO TABS: 1.0000 mg | ORAL_TABLET | Freq: Two times a day (BID) | ORAL | 1 refills | Status: DC | PRN

## 2018-10-11 MED ORDER — BUSPIRONE HCL 30 MG PO TABS: 30.0000 mg | ORAL_TABLET | Freq: Two times a day (BID) | ORAL | 5 refills | Status: DC
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
## 2018-10-11 NOTE — Progress Notes (Signed)
[facility] [place] [facility] x 2. They will cancel one of the orders.

## 2019-01-17 ENCOUNTER — Telehealth: Payer: Self-pay | Admitting: Physician Assistant

## 2019-01-17 NOTE — Telephone Encounter (Signed)
Pt has urgent need to get this letter by 5pm so he can get this job and stay out of town there while they interview him. I explained that he may not get it today.

## 2019-01-17 NOTE — Telephone Encounter (Signed)
Need chart please

## 2019-01-17 NOTE — Telephone Encounter (Signed)
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 This is for his new job. He needs this letter today if possible. It needs to be faxed to [employer]. Please fax to [phone]

## 2019-04-11 ENCOUNTER — Ambulatory Visit: Payer: 59 | Admitting: Physician Assistant

## 2019-05-15 ENCOUNTER — Other Ambulatory Visit: Payer: Self-pay | Admitting: Physician Assistant

## 2019-05-16 ENCOUNTER — Other Ambulatory Visit: Payer: Self-pay | Admitting: Physician Assistant

## 2019-06-14 ENCOUNTER — Other Ambulatory Visit: Payer: Self-pay | Admitting: Physician Assistant

## 2019-06-23 ENCOUNTER — Other Ambulatory Visit: Payer: Self-pay | Admitting: Physician Assistant

## 2019-07-05 ENCOUNTER — Other Ambulatory Visit: Payer: Self-pay

## 2019-07-05 ENCOUNTER — Telehealth: Payer: Self-pay | Admitting: Physician Assistant

## 2019-07-05 MED ORDER — PAROXETINE HCL 40 MG PO TABS: 40.0000 mg | ORAL_TABLET | Freq: Every day | ORAL | 1 refills | Status: DC

## 2019-07-05 MED ORDER — BUSPIRONE HCL 30 MG PO TABS: 30.0000 mg | ORAL_TABLET | Freq: Two times a day (BID) | ORAL | 1 refills | Status: DC

## 2019-07-05 NOTE — Telephone Encounter (Signed)
Patient need refills on all medications prescribed by Helene Kelp [facility] [place] patient has appt., 09/21 @3 :00

## 2019-07-05 NOTE — Telephone Encounter (Signed)
FYI Refills submitted for Paroxetine #30 1 refill and Buspirone #30 with 1 refill to get to his scheduled appt on 08/13/2019  Noted pt has been receiving clonazepam from other providers.

## 2019-07-06 NOTE — Telephone Encounter (Signed)
reviewed

## 2019-07-11 ENCOUNTER — Other Ambulatory Visit: Payer: Self-pay | Admitting: Physician Assistant

## 2019-08-13 ENCOUNTER — Ambulatory Visit: Payer: No Typology Code available for payment source | Admitting: Physician Assistant

## 2019-08-13 ENCOUNTER — Other Ambulatory Visit: Payer: Self-pay

## 2019-08-17 ENCOUNTER — Other Ambulatory Visit: Payer: Self-pay

## 2019-08-17 ENCOUNTER — Encounter: Payer: Self-pay | Admitting: Physician Assistant

## 2019-08-17 ENCOUNTER — Ambulatory Visit (INDEPENDENT_AMBULATORY_CARE_PROVIDER_SITE_OTHER): Payer: No Typology Code available for payment source | Admitting: Physician Assistant

## 2019-08-17 DIAGNOSIS — F4321 Adjustment disorder with depressed mood: Secondary | ICD-10-CM

## 2019-08-17 DIAGNOSIS — F411 Generalized anxiety disorder: Secondary | ICD-10-CM

## 2019-08-17 DIAGNOSIS — F1010 Alcohol abuse, uncomplicated: Secondary | ICD-10-CM | POA: Diagnosis not present

## 2019-08-17 MED ORDER — BUSPIRONE HCL 30 MG PO TABS: 30.0000 mg | ORAL_TABLET | Freq: Two times a day (BID) | ORAL | 1 refills | Status: DC

## 2019-08-17 MED ORDER — PAROXETINE HCL 30 MG PO TABS: 60.0000 mg | ORAL_TABLET | Freq: Every day | ORAL | 1 refills | Status: DC

## 2019-08-17 MED ORDER — HYDROXYZINE HCL 25 MG PO TABS: 25.0000 mg | ORAL_TABLET | Freq: Three times a day (TID) | ORAL | 0 refills | Status: DC | PRN

## 2019-08-17 MED ORDER — GABAPENTIN 600 MG PO TABS: ORAL_TABLET | ORAL | 1 refills | Status: DC

## 2019-08-17 MED ORDER — CLONAZEPAM 0.5 MG PO TABS: ORAL_TABLET | ORAL | 0 refills | Status: DC

## 2019-08-17 NOTE — Progress Notes (Signed)
[facility]  Patient ID: Oscar Ruiz,  MRN: [id number]  PCP: Harlene Salts, PA-C  Date of Evaluation: 08/17/2019 Time spent:25 minutes  Chief Complaint:  Chief Complaint    Anxiety     Virtual Visit via Telephone Note  I connected with patient by a video enabled telemedicine application or telephone, with their informed consent, and verified patient privacy and that I am speaking with the correct person using two identifiers.  I am private, in my office and the patient is at home.   I discussed the limitations, risks, security and privacy concerns of performing an evaluation and management service by telephone and the availability of in person appointments. I also discussed with the patient that there may be a patient responsible charge related to this service. The patient expressed understanding and agreed to proceed.   I discussed the assessment and treatment plan with the patient. The patient was provided an opportunity to ask questions and all were answered. The patient agreed with the plan and demonstrated an understanding of the instructions.   The patient was advised to call back or seek an in-person evaluation if the symptoms worsen or if the condition fails to improve as anticipated.  I provided 25 minutes of non-face-to-face time during this encounter.  HISTORY/CURRENT STATUS: HPI patient needs medications refilled.  He has been lost to follow-up for over a year.  He states he has been getting meds from his PCP.  He has moved and so has his previous provider and is too far for him to drive to see her now.  So he is coming back to me for refills.  Since he was last seen, he and his wife have separated.  And the patient has started back drinking again.  He reports drinking 3 to 5 cans of beer a day for months now.  He is not working right now.  "I have got a lot on my plate and the anxiety is pretty rough."  Has trouble sleeping sometimes.  He really does  not enjoy things.  Energy and motivation are low.  He does not cry easily.  He is not isolating any more than is absolutely have to do to the [place] pandemic.  He denies suicidal or homicidal thoughts.  Patient denies increased energy with decreased need for sleep, no increased talkativeness, no racing thoughts, no impulsivity or risky behaviors, no increased spending, no increased libido, no grandiosity.  Denies headaches or dizziness, no visual changes.  No earache or sore throat.  No cough other than smoker's cough.  No chest pain, palpitations, shortness of breath.  No abdominal pain, nausea or vomiting, constipation or diarrhea, no genitourinary symptoms.  Denies dizziness, syncope, seizures, numbness, tingling, tremor, tics, unsteady gait, slurred speech, confusion. Denies muscle or joint pain, stiffness, or dystonia.  Individual Medical History/ Review of Systems: Changes? :No    Past medications for mental health diagnoses include: uncertain  Allergies: Wellbutrin [bupropion]  Current Medications:  Current Outpatient Medications:  .  busPIRone (BUSPAR) 30 MG tablet, Take 1 tablet (30 mg total) by mouth 2 (two) times daily., Disp: 60 tablet, Rfl: 1 .  desmopressin (DDAVP) 0.2 MG tablet, Take 0.2 mg by mouth daily., Disp: , Rfl:  .  gabapentin (NEURONTIN) 600 MG tablet, TAKE 1 TABLET EVERY MORNING, 1 TABLET AT NOON, AND 2 TABLETS AT BEDTIME, Disp: 120 tablet, Rfl: 1 .  metoprolol succinate (TOPROL-XL) 100 MG 24 hr tablet, TAKE ONE TABLET BY MOUTH DAILY WITH MEALS, Disp:  30 tablet, Rfl: 8 .  atorvastatin (LIPITOR) 10 MG tablet, Take 10 mg by mouth daily., Disp: , Rfl:  .  clonazePAM (KLONOPIN) 0.5 MG tablet, 1.5 po q am, 1 at lunch, 1 hs x 7 days, 1 tid x 7 days,1 q am, 0.5 pills at lunch, 1 qhs x7 days, 1 bid for 7 days, Disp: 77 tablet, Rfl: 0 .  hydrOXYzine (ATARAX/VISTARIL) 25 MG tablet, Take 1 tablet (25 mg total) by mouth 3 (three) times daily as needed., Disp: 60 tablet, Rfl: 0 .   PARoxetine (PAXIL) 30 MG tablet, Take 2 tablets (60 mg total) by mouth daily., Disp: 60 tablet, Rfl: 1 Medication Side Effects: none  Family Medical/ Social History: Changes? Yes see HPI  MENTAL HEALTH EXAM:  There were no vitals taken for this visit.There is no height or weight on file to calculate BMI.  General Appearance: unable to assess  Eye Contact:  unable to assess  Speech:  Clear and Coherent  Volume:  Normal  Mood:  Euthymic  Affect:  unable to assess  Thought Process:  Goal Directed and Descriptions of Associations: Intact  Orientation:  Full (Time, Place, and Person)  Thought Content: Logical   Suicidal Thoughts:  No  Homicidal Thoughts:  No  Memory:  WNL  Judgement:  Good  Insight:  Good  Psychomotor Activity:  Unable to assess  Concentration:  Concentration: Good  Recall:  Good  Fund of Knowledge: Good  Language: Good  Assets:  Desire for Improvement  ADL's:  Intact  Cognition: WNL  Prognosis:  Good    DIAGNOSES:    ICD-10-CM   1. Generalized anxiety disorder  F41.1   2. Situational depression  F43.21   3. Alcohol abuse  F10.10     Receiving Psychotherapy: No    RECOMMENDATIONS:  I spent 25 minutes with him phone to phone, and 50% of that time was in counseling concerning his diagnoses and treatment options. He cannot take a benzodiazepine and mix that with alcohol.  Therefore I am weaning him off of the Klonopin.  We will increase the Paxil, and add hydroxyzine to help with the anxiety.  He does not want to do this but I will not continue prescribing the Klonopin after we safely wean him off. We discussed alcohol cessation and different programs including AA that he could get involved with.  He could also go back to rehab as he did in 2017.  States he is not ready to quit. Increase Paxil to 60 mg daily. Start hydroxyzine 25 mg 1 3 times daily as needed. Continue gabapentin 600 mg 1 every morning, 1 at noon, 2 nightly. Continue BuSpar 30 mg twice  daily. Wean off Klonopin  0.5 mg, 1.5 pills every morning, 1 at lunch, 1 nightly for 7 days.  Then 1 p.o. 3 times daily for 7 days.  Then 1 every morning, 1/2 at lunch and 1 nightly for 7 days.  Then 1 twice daily for 7 days and then I will see him back for further instructions. Recommend therapy. Return in 4 weeks.  Melony Overly, PA-C

## 2019-09-09 ENCOUNTER — Other Ambulatory Visit: Payer: Self-pay | Admitting: Physician Assistant

## 2019-09-10 ENCOUNTER — Ambulatory Visit: Payer: No Typology Code available for payment source | Admitting: Physician Assistant

## 2019-09-24 ENCOUNTER — Other Ambulatory Visit: Payer: Self-pay | Admitting: Cardiology

## 2019-09-24 DIAGNOSIS — R000 Tachycardia, unspecified: Secondary | ICD-10-CM

## 2019-09-24 DIAGNOSIS — I10 Essential (primary) hypertension: Secondary | ICD-10-CM

## 2019-09-24 MED ORDER — METOPROLOL SUCCINATE ER 100 MG PO TB24: 100.0000 mg | ORAL_TABLET | Freq: Every day | ORAL | 0 refills | Status: DC

## 2019-09-24 NOTE — Telephone Encounter (Signed)
°*  STAT* If patient is at the pharmacy, call can be transferred to refill team.   1. Which medications need to be refilled? (please list name of each medication and dose if known)  metoprolol succinate (TOPROL-XL) 100 MG 24 hr tablet     2. Which pharmacy/location (including street and city if local pharmacy) is medication to be sent to? [id number] [place] [place] [address] [facility] [address]  3. Do they need a 30 day or 90 day supply? [place]

## 2019-09-28 ENCOUNTER — Telehealth: Payer: Self-pay | Admitting: Physician Assistant

## 2019-09-28 NOTE — Telephone Encounter (Signed)
Was feeling fine until last visit with Helene Kelp and she changed the meds around. He has increased the Paxil and will need a refill of it. Not sure things are working the way that he is taking it now. [facility]  [address]

## 2019-10-01 ENCOUNTER — Other Ambulatory Visit: Payer: Self-pay | Admitting: Physician Assistant

## 2019-10-01 MED ORDER — CLONAZEPAM 0.5 MG PO TABS: ORAL_TABLET | ORAL | 0 refills | Status: DC

## 2019-10-01 NOTE — Telephone Encounter (Signed)
I think I routed this incorrectly on 11/6.

## 2019-10-01 NOTE — Telephone Encounter (Signed)
Traci, thanks.  Please see my note in the chart from today's date.  I have sent in enough for a week to wean him off and then I am not given him anymore.  You do not have to call him.  I will talk to him on the phone or either at the appointment about this.  Thanks

## 2019-10-01 NOTE — Telephone Encounter (Signed)
Increase Gabapentin 600 mg to 2 qam, 1 at lunch, 2 w/ evening meal. He can stay on Klonopin 0.5 mg 1 qd prn until I see him.  Let me know if I need to send in Rx.  Let him know I'm going to stop it at next visit. He can't stay on that and alcohol.

## 2019-10-01 NOTE — Progress Notes (Signed)
I've reviewed PDMP.   Pt received Klonopin 1.0 mg #60, from [employer], PA-C on 08/16/2019 and Klonopin 0.5 mg #77 (I was weaning off) on 08/17/2019. I put a call into Kristen at [phone] and left a message with this information.    5:35 PM I sent in a prescription for Klonopin 0.5 mg, #7, 6-1/2 p.o. twice daily for 5 days, then one half daily for 4 days and then stop.

## 2019-10-03 ENCOUNTER — Other Ambulatory Visit: Payer: Self-pay | Admitting: Physician Assistant

## 2019-10-16 ENCOUNTER — Other Ambulatory Visit: Payer: Self-pay

## 2019-10-16 ENCOUNTER — Telehealth: Payer: Self-pay | Admitting: Physician Assistant

## 2019-10-16 MED ORDER — GABAPENTIN 600 MG PO TABS: ORAL_TABLET | ORAL | 1 refills | Status: DC

## 2019-10-16 MED ORDER — PAROXETINE HCL 30 MG PO TABS: 60.0000 mg | ORAL_TABLET | Freq: Every day | ORAL | 1 refills | Status: DC

## 2019-10-16 MED ORDER — BUSPIRONE HCL 30 MG PO TABS: 30.0000 mg | ORAL_TABLET | Freq: Two times a day (BID) | ORAL | 1 refills | Status: DC

## 2019-10-16 NOTE — Telephone Encounter (Signed)
Pt called requesting refills be placed on file ready for pick up. He stated he will be out by his appt time scheduled for 12/29. This was a rescheduled appt due to provider being out. [facility] [place].

## 2019-10-16 NOTE — Telephone Encounter (Signed)
No controlled substances, please remind him that he and I already discussed.

## 2019-10-16 NOTE — Telephone Encounter (Signed)
Refills submitted for patient's gabapentin with dose increase, buspar, and paxil

## 2019-10-22 ENCOUNTER — Ambulatory Visit: Payer: 59 | Admitting: Nurse Practitioner

## 2019-10-25 ENCOUNTER — Ambulatory Visit: Payer: No Typology Code available for payment source | Admitting: Physician Assistant

## 2019-10-31 ENCOUNTER — Encounter: Payer: Self-pay | Admitting: Cardiology

## 2019-10-31 ENCOUNTER — Ambulatory Visit (INDEPENDENT_AMBULATORY_CARE_PROVIDER_SITE_OTHER): Payer: 59 | Admitting: Cardiology

## 2019-10-31 ENCOUNTER — Telehealth: Payer: Self-pay | Admitting: Nurse Practitioner

## 2019-10-31 ENCOUNTER — Other Ambulatory Visit: Payer: Self-pay

## 2019-10-31 VITALS — BP 130/90 | HR 81 | Ht 68.0 in | Wt 203.4 lb

## 2019-10-31 DIAGNOSIS — Z720 Tobacco use: Secondary | ICD-10-CM

## 2019-10-31 DIAGNOSIS — F1010 Alcohol abuse, uncomplicated: Secondary | ICD-10-CM

## 2019-10-31 DIAGNOSIS — R000 Tachycardia, unspecified: Secondary | ICD-10-CM

## 2019-10-31 DIAGNOSIS — E782 Mixed hyperlipidemia: Secondary | ICD-10-CM

## 2019-10-31 DIAGNOSIS — I10 Essential (primary) hypertension: Secondary | ICD-10-CM

## 2019-10-31 MED ORDER — METOPROLOL SUCCINATE ER 100 MG PO TB24: 100.0000 mg | ORAL_TABLET | Freq: Every day | ORAL | 3 refills | Status: DC

## 2019-10-31 NOTE — Progress Notes (Signed)
Cardiology Office Note:    Date:  10/31/2019   ID:  Oscar Ruiz, DOB 02/26/1976, MRN [id number]  PCP:  Hess, Kristin M, PA-C  Cardiologist:  Mark Skains, MD  Electrophysiologist:  None   Referring MD: Hess, Kristin M, PA-C     History of Present Illness:    Oscar Ruiz is a 43 y.o. male here for the follow-up of hypertension prior chest pain.  He was last seen in the office on 02/21/2017 by Laura Ingold.  At that time he had gone through rehab for alcohol and was dry for several months.  Continues to smoke.  Has a history of sinus tachycardia in the setting of panic disorder.  Treated with beta-blocker therapy.  On a prior visit he had stopped his HCTZ.  Been fine. If misses metoprolol heart starts pounding. It works.   Past Medical History:  Diagnosis Date  . Anxiety   . Hypertension   . Seizures (HCC)   . Substance abuse (HCC)    tobacco and alcohol    Past Surgical History:  Procedure Laterality Date  . HERNIA REPAIR      Current Medications: Current Meds  Medication Sig  . busPIRone (BUSPAR) 30 MG tablet Take 1 tablet (30 mg total) by mouth 2 (two) times daily.  . gabapentin (NEURONTIN) 600 MG tablet Take 2 capsules by mouth every morning, 1 capsule at noon, and take 2 capsules at bedtime.  . hydrOXYzine (ATARAX/VISTARIL) 25 MG tablet Take 1 tablet (25 mg total) by mouth 3 (three) times daily as needed.  . metoprolol succinate (TOPROL-XL) 100 MG 24 hr tablet Take 1 tablet (100 mg total) by mouth daily. Take with or immediately following a meal.  . PARoxetine (PAXIL) 30 MG tablet Take 2 tablets (60 mg total) by mouth daily.  . [DISCONTINUED] metoprolol succinate (TOPROL-XL) 100 MG 24 hr tablet Take 1 tablet (100 mg total) by mouth daily. Take with or immediately following a meal.     Allergies:   Wellbutrin [bupropion]   Social History   Socioeconomic History  . Marital status: Married    Spouse name: Not on file  . Number of children: 2  . Years of  education: Not on file  . Highest education level: Not on file  Occupational History  . Occupation: plumbing    Employer: SELF  Social Needs  . Financial resource strain: Not on file  . Food insecurity    Worry: Not on file    Inability: Not on file  . Transportation needs    Medical: Not on file    Non-medical: Not on file  Tobacco Use  . Smoking status: Current Every Day Smoker    Packs/day: 1.00    Types: Cigarettes  . Smokeless tobacco: Former User  Substance and Sexual Activity  . Alcohol use: Yes    Alcohol/week: 3.0 - 5.0 standard drinks    Types: 3 - 5 Cans of beer per week    Comment: was in remission for 2 years in rehab 2017  . Drug use: No  . Sexual activity: Yes    Birth control/protection: None  Lifestyle  . Physical activity    Days per week: Not on file    Minutes per session: Not on file  . Stress: Not on file  Relationships  . Social connections    Talks on phone: Not on file    Gets together: Not on file    Attends religious service: Not on file      Active member of club or organization: Not on file    Attends meetings of clubs or organizations: Not on file    Relationship status: Not on file  Other Topics Concern  . Not on file  Social History Narrative   Regular exercise-yes   Diet: Fast food, skips meals     Family History: The patient's family history includes Alcohol abuse in his father; Coronary artery disease in his father; Diabetes in his mother; Heart failure in his father; Hyperlipidemia in his father.  ROS:   Please see the history of present illness.     All other systems reviewed and are negative.  EKGs/Labs/Other Studies Reviewed:    The following studies were reviewed today: ECHO: Study Conclusions  - Left ventricle: The cavity size was normal. Systolic function was normal. The estimated ejection fraction was in the range of 55% to 60%. Wall motion was normal; there were no regional wall motion abnormalities. Left  ventricular diastolic function parameters were normal.   EKG:  EKG is ordered today.  The ekg ordered today demonstrates 2018 - NSR 80  Recent Labs: No results found for requested labs within last 8760 hours.  Recent Lipid Panel    Component Value Date/Time   CHOL 266 (H) 02/21/2017 1612   TRIG 641 (HH) 02/21/2017 1612   HDL 23 (L) 02/21/2017 1612   CHOLHDL 11.6 (H) 02/21/2017 1612   CHOLHDL 7 03/14/2014 1108   VLDL 49.0 (H) 03/14/2014 1108   LDLCALC Comment 02/21/2017 1612   LDLDIRECT 77.1 10/24/2013 1031    Physical Exam:    VS:  BP 130/90   Pulse 81   Ht 5' 8" (1.727 m)   Wt 203 lb 6.4 oz (92.3 kg)   SpO2 99%   BMI 30.93 kg/m     Wt Readings from Last 3 Encounters:  10/31/19 203 lb 6.4 oz (92.3 kg)  11/06/17 238 lb (108 kg)  02/21/17 238 lb 12.8 oz (108.3 kg)     GEN:  Well nourished, well developed in no acute distress HEENT: Normal NECK: No JVD; No carotid bruits LYMPHATICS: No lymphadenopathy CARDIAC: RRR, no murmurs, rubs, gallops RESPIRATORY:  Clear to auscultation without rales, wheezing or rhonchi  ABDOMEN: Soft, non-tender, non-distended MUSCULOSKELETAL:  No edema; No deformity  SKIN: Warm and dry NEUROLOGIC:  Alert and oriented x 3 PSYCHIATRIC:  Normal affect   ASSESSMENT:    1. Mixed hyperlipidemia   2. Essential hypertension   3. Sinus tachycardia   4. Tobacco abuse   5. ETOH abuse    PLAN:    In order of problems listed above:  Essential hypertension -Medications reviewed.  Read above controlled.  We will go ahead and continue with the Toprol.  He feels tachypalpitations.  Hyperlipidemia -Has taken fenofibrate in the past.  Has also tried atorvastatin 10 mg in the past.  We will repeat a lipid panel, complete metabolic profile.  Triglycerides in the past were 641.  Certainly alcohol contributing.  I am sure that his triglycerides will still be elevated.  I think that we will start atorvastatin 20 mg and fenofibrate once again after  lab work is back.  Spoke with pharmacy team.  Anxiety -Ongoing issue.  Therapy.  Alcohol use -Drinking more.  Continue to encourage cessation.  Has been able to be sober for about 2 years.  Back drinking.  Tobacco use -Continue to encourage cessation.  Medication Adjustments/Labs and Tests Ordered: Current medicines are reviewed at length with the patient today.  Concerns   regarding medicines are outlined above.  Orders Placed This Encounter  Procedures  . Comp Met (CMET)  . CBC  . Lipid Profile  . EKG 12-Lead   Meds ordered this encounter  Medications  . metoprolol succinate (TOPROL-XL) 100 MG 24 hr tablet    Sig: Take 1 tablet (100 mg total) by mouth daily. Take with or immediately following a meal.    Dispense:  90 tablet    Refill:  3    Patient Instructions  Medication Instructions:   Your physician recommends that you continue on your current medications as directed. Please refer to the Current Medication list given to you today.  *If you need a refill on your cardiac medications before your next appointment, please call your pharmacy*   Lab Work:  TODAY--CMET, CBC, AND LIPIDS  If you have labs (blood work) drawn today and your tests are completely normal, you will receive your results only by: Marland Kitchen MyChart Message (if you have MyChart) OR . A paper copy in the mail If you have any lab test that is abnormal or we need to change your treatment, we will call you to review the results.   Follow-Up: At [facility], you and your health needs are our priority.  As part of our continuing mission to provide you with exceptional heart care, we have created designated Provider Care Teams.  These Care Teams include your primary Cardiologist (physician) and Advanced Practice Providers (APPs -  Physician Assistants and Nurse Practitioners) who all work together to provide you with the care you need, when you need it.  Your next appointment:   12 month(s)  The format for  your next appointment:   In Person  Provider:   Candee Furbish, MD       Signed, Candee Furbish, MD  10/31/2019 9:49 AM    [place]

## 2019-10-31 NOTE — Patient Instructions (Signed)
Medication Instructions:   Your physician recommends that you continue on your current medications as directed. Please refer to the Current Medication list given to you today.  *If you need a refill on your cardiac medications before your next appointment, please call your pharmacy*   Lab Work:  TODAY--CMET, CBC, AND LIPIDS  If you have labs (blood work) drawn today and your tests are completely normal, you will receive your results only by: Marland Kitchen MyChart Message (if you have MyChart) OR . A paper copy in the mail If you have any lab test that is abnormal or we need to change your treatment, we will call you to review the results.   Follow-Up: At [facility], you and your health needs are our priority.  As part of our continuing mission to provide you with exceptional heart care, we have created designated Provider Care Teams.  These Care Teams include your primary Cardiologist (physician) and Advanced Practice Providers (APPs -  Physician Assistants and Nurse Practitioners) who all work together to provide you with the care you need, when you need it.  Your next appointment:   12 month(s)  The format for your next appointment:   In Person  Provider:   Candee Furbish, MD

## 2019-10-31 NOTE — Telephone Encounter (Signed)
   I received a call from [place] re: labs drawn earlier today on Oscar Ruiz:  Na 130 K 5.7 Cl 81 CO2 20 BUN 9 Creat 1.26 Gluc 626  Lab Results  Component Value Date   ALT 79 (H) 10/31/2019   AST 89 (H) 10/31/2019   ALKPHOS 107 10/31/2019   BILITOT 0.3 10/31/2019   Lab Results  Component Value Date   CHOL 440 (H) 10/31/2019   HDL 10 (L) 10/31/2019   LDLCALC Comment (A) 10/31/2019   LDLDIRECT 77.1 10/24/2013   TRIG 3,788 (HH) 10/31/2019   CHOLHDL 44.0 (H) 10/31/2019     I called Oscar Ruiz to discuss glucose/K and advise ER eval, however, his phone went to voicemail.  I did leave a message requesting a callback.  Murray Hodgkins, NP 10/31/2019, 5:57 PM

## 2019-10-31 NOTE — Telephone Encounter (Signed)
   Pt called back and I was able to convey lab results to him.  He has not prior h/o DM but was told several years ago that he was 'borderline.'  He has noted increased thirst recently.  I rec ED eval tonight for significant hyperglycemia and mild hyperkalemia.  He and his significant other verbalized understanding and will come into [facility] ED this evening.  Na 130 K 5.7 Cl 81 CO2 20 BUN 9 Creat 1.26 Gluc 626   Recent Labs       Lab Results  Component Value Date   ALT 79 (H) 10/31/2019   AST 89 (H) 10/31/2019   ALKPHOS 107 10/31/2019   BILITOT 0.3 10/31/2019     Murray Hodgkins, NP 10/31/2019, 6:56 PM

## 2019-11-01 ENCOUNTER — Telehealth: Payer: Self-pay | Admitting: Cardiology

## 2019-11-01 DIAGNOSIS — R739 Hyperglycemia, unspecified: Secondary | ICD-10-CM

## 2019-11-01 LAB — COMPREHENSIVE METABOLIC PANEL
ALT: 79 IU/L — ABNORMAL HIGH (ref 0–44)
AST: 89 IU/L — ABNORMAL HIGH (ref 0–40)
Albumin/Globulin Ratio: 2 (ref 1.2–2.2)
Albumin: 4.2 g/dL (ref 4.0–5.0)
Alkaline Phosphatase: 107 IU/L (ref 39–117)
BUN/Creatinine Ratio: 7 — ABNORMAL LOW (ref 9–20)
BUN: 9 mg/dL (ref 6–24)
Bilirubin Total: 0.3 mg/dL (ref 0.0–1.2)
CO2: 20 mmol/L (ref 20–29)
Calcium: 9.3 mg/dL (ref 8.7–10.2)
Chloride: 81 mmol/L — ABNORMAL LOW (ref 96–106)
Creatinine, Ser: 1.26 mg/dL (ref 0.76–1.27)
GFR calc Af Amer: 81 mL/min/{1.73_m2} (ref >59)
GFR calc non Af Amer: 70 mL/min/{1.73_m2} (ref >59)
Globulin, Total: 2.1 g/dL (ref 1.5–4.5)
Glucose: 625 mg/dL (ref 65–99)
Potassium: 5.7 mmol/L — ABNORMAL HIGH (ref 3.5–5.2)
Sodium: 130 mmol/L — ABNORMAL LOW (ref 134–144)
Total Protein: 6.3 g/dL (ref 6.0–8.5)

## 2019-11-01 LAB — LIPID PANEL
Chol/HDL Ratio: 44 ratio — ABNORMAL HIGH (ref 0.0–5.0)
Cholesterol, Total: 440 mg/dL — ABNORMAL HIGH (ref 100–199)
HDL: 10 mg/dL — ABNORMAL LOW (ref >39)
Triglycerides: 3788 mg/dL (ref 0–149)

## 2019-11-01 LAB — CBC
Hematocrit: 46.3 % (ref 37.5–51.0)
Hemoglobin: 17 g/dL (ref 13.0–17.7)
MCH: 34.2 pg — ABNORMAL HIGH (ref 26.6–33.0)
MCHC: 36.7 g/dL — ABNORMAL HIGH (ref 31.5–35.7)
MCV: 93 fL (ref 79–97)
Platelets: 128 10*3/uL — ABNORMAL LOW (ref 150–450)
RBC: 4.97 x10E6/uL (ref 4.14–5.80)
RDW: 11.9 % (ref 11.6–15.4)
WBC: 6.6 10*3/uL (ref 3.4–10.8)

## 2019-11-01 MED ORDER — FISH OIL 1000 MG PO CAPS: 2000.0000 mg | ORAL_CAPSULE | Freq: Two times a day (BID) | ORAL | 0 refills | Status: AC

## 2019-11-01 MED ORDER — FENOFIBRATE 145 MG PO TABS: 145.0000 mg | ORAL_TABLET | Freq: Every day | ORAL | 11 refills | Status: DC

## 2019-11-01 MED ORDER — ATORVASTATIN CALCIUM 40 MG PO TABS: 40.0000 mg | ORAL_TABLET | Freq: Every day | ORAL | 11 refills | Status: DC

## 2019-11-01 NOTE — Telephone Encounter (Signed)
Multiple lab abnormalities. Care team try to contact him last night without success, left voicemail. Couple action items, we will go ahead and urgently refer him to endocrinology given his glucose. Continued mildly elevated liver functions compatible with heavy alcohol use. Triglycerides severely elevated in the 3000 range. We will go ahead and start him on atorvastatin 40 mg and fenofibrate 154 mg as well as fish oil 2 g twice daily. Also looks like he needs a primary care physician as well. We will continue to try to contact him with care plan.  Candee Furbish, MD

## 2019-11-01 NOTE — Telephone Encounter (Signed)
Follow Up  Patient is calling back in to get a referral to see Dr. Loanne Drilling at [facility]. Due to the increased glucose level. Please assist.

## 2019-11-01 NOTE — Telephone Encounter (Signed)
Patient left message with answering service: Returning missed call about test results.

## 2019-11-01 NOTE — Progress Notes (Signed)
Thanks for sending me these labs, and keeping me in the loop.

## 2019-11-01 NOTE — Telephone Encounter (Signed)
Spoke with wife, Nira Conn (on [place]) .  She had reviewed labs on MyChart.  Reviewed those again with her and reviewed Dr Marlou Porch' orders with her.  She will notify the patient of these orders (he is asleep).  RX with Atorvastatin 40 and Fenofibrate 154 to [facility].  Aware Fish oil 2 grams BID is OTC. Referral for Dr Loanne Drilling will be placed.  Wife states they will continue to work on finding a PCP.  Pt's most recent PCP had moved away.  All questions if any where answered at the time of the call.  They will c/b with any other questions or concerns.   Of note: no 154 mg dose of Fenofibrate found in data base.  Per Dr Marlou Porch 145 mg to be ordered.

## 2019-11-02 ENCOUNTER — Other Ambulatory Visit: Payer: Self-pay

## 2019-11-02 ENCOUNTER — Encounter: Payer: No Typology Code available for payment source | Attending: Physician Assistant | Admitting: Dietician

## 2019-11-02 ENCOUNTER — Telehealth: Payer: Self-pay

## 2019-11-02 ENCOUNTER — Encounter: Payer: Self-pay | Admitting: Dietician

## 2019-11-02 ENCOUNTER — Ambulatory Visit (INDEPENDENT_AMBULATORY_CARE_PROVIDER_SITE_OTHER): Payer: Self-pay | Admitting: Endocrinology

## 2019-11-02 ENCOUNTER — Encounter: Payer: Self-pay | Admitting: Endocrinology

## 2019-11-02 VITALS — BP 138/82 | HR 94 | Ht 68.0 in | Wt 200.4 lb

## 2019-11-02 DIAGNOSIS — E119 Type 2 diabetes mellitus without complications: Secondary | ICD-10-CM | POA: Diagnosis not present

## 2019-11-02 LAB — POCT GLYCOSYLATED HEMOGLOBIN (HGB A1C): HbA1c POC (<> result, manual entry): >14.9 % (ref 4.0–5.6)

## 2019-11-02 MED ORDER — LANTUS SOLOSTAR 100 UNIT/ML ~~LOC~~ SOPN: 20.0000 [IU] | PEN_INJECTOR | SUBCUTANEOUS | 11 refills | Status: DC

## 2019-11-02 MED ORDER — ONETOUCH VERIO VI STRP: 1.0000 | ORAL_STRIP | Freq: Two times a day (BID) | 3 refills | Status: DC

## 2019-11-02 NOTE — Telephone Encounter (Signed)
Please advise 

## 2019-11-02 NOTE — Progress Notes (Signed)
Subjective:    Patient ID: Oscar Ruiz, male    DOB: 04-27-1976, 43 y.o.   MRN: [id number]  HPI pt is referred by Dr Anne FuSkains for diabetes.  This was dx'ed on labs.  he has mild if any neuropathy of the lower extremities; he is unaware of any associated chronic complications; he has never been on medication for DM; pt says his diet and exercise are poor; he has never had pancreatitis, pancreatic surgery, severe hypoglycemia or DKA.  he has no prior h/o DM, although A1c was 6.3% in 2014. Past Medical History:  Diagnosis Date  . Anxiety   . Hypertension   . Seizures (HCC)   . Substance abuse (HCC)    tobacco and alcohol    Past Surgical History:  Procedure Laterality Date  . HERNIA REPAIR      Social History   Socioeconomic History  . Marital status: Married    Spouse name: Not on file  . Number of children: 2  . Years of education: Not on file  . Highest education level: Not on file  Occupational History  . Occupation: Artistplumbing    Employer: SELF  Tobacco Use  . Smoking status: Current Every Day Smoker    Packs/day: 1.00    Types: Cigarettes  . Smokeless tobacco: Former Engineer, waterUser  Substance and Sexual Activity  . Alcohol use: Yes    Alcohol/week: 3.0 - 5.0 standard drinks    Types: 3 - 5 Cans of beer per week    Comment: was in remission for 2 years in rehab 2017  . Drug use: No  . Sexual activity: Yes    Birth control/protection: None  Other Topics Concern  . Not on file  Social History Narrative   Regular exercise-yes   Diet: Fast food, skips meals   Social Determinants of Health   Financial Resource Strain:   . Difficulty of Paying Living Expenses: Not on file  Food Insecurity:   . Worried About Programme researcher, broadcasting/film/videounning Out of Food in the Last Year: Not on file  . Ran Out of Food in the Last Year: Not on file  Transportation Needs:   . Lack of Transportation (Medical): Not on file  . Lack of Transportation (Non-Medical): Not on file  Physical Activity:   . Days of  Exercise per Week: Not on file  . Minutes of Exercise per Session: Not on file  Stress:   . Feeling of Stress : Not on file  Social Connections:   . Frequency of Communication with Friends and Family: Not on file  . Frequency of Social Gatherings with Friends and Family: Not on file  . Attends Religious Services: Not on file  . Active Member of Clubs or Organizations: Not on file  . Attends BankerClub or Organization Meetings: Not on file  . Marital Status: Not on file  Intimate Partner Violence:   . Fear of Current or Ex-Partner: Not on file  . Emotionally Abused: Not on file  . Physically Abused: Not on file  . Sexually Abused: Not on file    Current Outpatient Medications on File Prior to Visit  Medication Sig Dispense Refill  . atorvastatin (LIPITOR) 40 MG tablet Take 1 tablet (40 mg total) by mouth daily. 30 tablet 11  . busPIRone (BUSPAR) 30 MG tablet Take 1 tablet (30 mg total) by mouth 2 (two) times daily. 60 tablet 1  . gabapentin (NEURONTIN) 600 MG tablet Take 2 capsules by mouth every morning, 1 capsule at noon, and  take 2 capsules at bedtime. 150 tablet 1  . hydrOXYzine (ATARAX/VISTARIL) 25 MG tablet Take 1 tablet (25 mg total) by mouth 3 (three) times daily as needed. 60 tablet 0  . metoprolol succinate (TOPROL-XL) 100 MG 24 hr tablet Take 1 tablet (100 mg total) by mouth daily. Take with or immediately following a meal. 90 tablet 3  . PARoxetine (PAXIL) 30 MG tablet Take 2 tablets (60 mg total) by mouth daily. 60 tablet 1  . fenofibrate (TRICOR) 145 MG tablet Take 1 tablet (145 mg total) by mouth daily. (Patient not taking: Reported on 11/02/2019) 30 tablet 11  . Omega-3 Fatty Acids (FISH OIL) 1000 MG CAPS Take 2 capsules (2,000 mg total) by mouth 2 (two) times daily. (Patient not taking: Reported on 11/02/2019)  0   No current facility-administered medications on file prior to visit.    Allergies  Allergen Reactions  . Wellbutrin [Bupropion]     Family History  Problem  Relation Age of Onset  . Diabetes Mother   . Alcohol abuse Father   . Hyperlipidemia Father   . Coronary artery disease Father   . Heart failure Father     BP 138/82 (BP Location: Right Arm, Patient Position: Sitting, Cuff Size: Normal)   Pulse 94   Ht 5\' 8"  (1.727 m)   Wt 200 lb 6.4 oz (90.9 kg)   SpO2 98%   BMI 30.47 kg/m    Review of Systems denies blurry vision, headache, chest pain, sob, n/v, urinary frequency, muscle cramps, excessive diaphoresis, memory loss, cold intolerance, rhinorrhea, and easy bruising.  He has lost 30 lbs x a few months.  He has chronic depression.    Objective:   Physical Exam VS: see vs page GEN: no distress HEAD: head: no deformity.   eyes: no periorbital swelling, no proptosis.   external nose and ears are normal.  NECK: supple, thyroid is not enlarged CHEST WALL: no deformity LUNGS: clear to auscultation.   CV: reg rate and rhythm, no murmur.   ABD: abdomen is soft, nontender.  no hepatosplenomegaly.  not distended.  no hernia.   MUSCULOSKELETAL: muscle bulk and strength are grossly normal.  no obvious joint swelling.  gait is normal and steady.   EXTEMITIES: no deformity.  no ulcer on the feet.  feet are of normal color and temp.  no edema PULSES: dorsalis pedis intact bilat.  no carotid bruit NEURO:  cn 2-12 grossly intact.   readily moves all 4's.  sensation is intact to touch on the feet.   SKIN:  Normal texture and temperature.  No rash or suspicious lesion is visible.   NODES:  None palpable at the neck.   PSYCH: alert, well-oriented.  Does not appear anxious nor depressed.     Lab Results  Component Value Date   CREATININE 1.26 10/31/2019   BUN 9 10/31/2019   NA 130 (L) 10/31/2019   K 5.7 (H) 10/31/2019   CL 81 (L) 10/31/2019   CO2 20 10/31/2019   Lab Results  Component Value Date   HGBA1C >14.9 11/02/2019    I have reviewed outside records, and summarized: Pt was noted to have severely elevated glucose, and referred  here.  Pt was seen by cardiol for dyslipidemia.    I personally reviewed electrocardiogram tracing (10/31/19): Indication: dyslipidemia.  Impression: NSR.  No MI.  No hypertrophy.   Compared to 2018: no significant change.       Assessment & Plan:  Type 2 DM, severe exac.  Patient Instructions  good diet and exercise significantly improve the control of your diabetes.  please let me know if you wish to be referred to a dietician.  high blood sugar is very risky to your health.  you should see an eye doctor and dentist every year.  It is very important to get all recommended vaccinations.  Controlling your blood pressure and cholesterol drastically reduces the damage diabetes does to your body.  Those who smoke should quit.  Please discuss these with your doctor.   check your blood sugar twice a day.  vary the time of day when you check, between before the 3 meals, and at bedtime.  also check if you have symptoms of your blood sugar being too high or too low.  please keep a record of the readings and bring it to your next appointment here (or you can bring the meter itself).  You can write it on any piece of paper.  please call us sooner if your blood sugar goes below 70, or if you have a lot of readings over 200. I have sent a prescription to your pharmacy, to start insulin, 20 units with breakfast.   Please call or message [place] next week, to tell us how the blood sugar is doing.   Please come back for a follow-up appointment in 1 month.

## 2019-11-02 NOTE — Patient Instructions (Addendum)
good diet and exercise significantly improve the control of your diabetes.  please let me know if you wish to be referred to a dietician.  high blood sugar is very risky to your health.  you should see an eye doctor and dentist every year.  It is very important to get all recommended vaccinations.  Controlling your blood pressure and cholesterol drastically reduces the damage diabetes does to your body.  Those who smoke should quit.  Please discuss these with your doctor.   check your blood sugar twice a day.  vary the time of day when you check, between before the 3 meals, and at bedtime.  also check if you have symptoms of your blood sugar being too high or too low.  please keep a record of the readings and bring it to your next appointment here (or you can bring the meter itself).  You can write it on any piece of paper.  please call us sooner if your blood sugar goes below 70, or if you have a lot of readings over 200. I have sent a prescription to your pharmacy, to start insulin, 20 units with breakfast.   Please call or message [place] next week, to tell us how the blood sugar is doing.   Please come back for a follow-up appointment in 1 month.

## 2019-11-02 NOTE — Progress Notes (Signed)
Patient with newly diagnosed type 2 diabetes referred to me for insulin instruction and was seen this am between 11:00-11:30.  He is to start Lantus.  Instructed patient and wife on insulin administration, how to store the insulin, changing the needle after each use, priming the pen with first use, injecting the insulin and site rotation.  Discussed that Lantus is a long acting insulin and does not cover his meals.  Showed a video on injection as well.  Patient states that he is not ready for more information at this time and really does not want to change what he eats or drinks.  Inquired about his beverages and states that he drinks beer and regular Pepsi.  He does not keep track of how much beer.  He is willing to changed to diet Pepsi.  Discussed that alcohol can cause hypoglycemia and that he should limit his beer to 2 and always eat when he drinks.  His wife states that she is seen at this office for hypoglycemia.  She states that she is familiar with how to treat a low BG.  He eats once per day.  Discussed that it is better to spread the carbohydrates out throughout the day.  She has been checking his BG since Wednesday.  Discussed BG goals and times.   Provided the book, "How to Thrive:  A Guide for your Journey with Diabetes" along with my card.  He is to call for questions or for an appointment as desired.  Antonieta Iba, RD, LDN, CDE

## 2019-11-02 NOTE — Telephone Encounter (Signed)
Following message sent to pt:  Hi Oscar Ruiz,  Dr. Loanne Drilling has advised that you come in to the office today at 1pm. Because you are a new patient, we ask that you arrive at 12:45pm to complete the registration process. We look forward to seeing you today at 1pm.  Thank you, [facility]  Refer to appt info below:  Oscar Ruiz, Oscar Ruiz MRN: [id number]   Date: 11/02/2019 Status: Sch  Time: 1:00 PM Length: 15  Visit Type: NEW PATIENT 30 [1003] Copay: $0.00  Provider: Renato Shin, MD Department: [facility]  Referring Provider: Jerline Pain CSN: [id number]  Notes: EPIC/Elevated Blood Sugar/Dr Skains/[employer]  Rescheduled: 11/02/2019 11:12 AM By: Hardie Pulley, Grigor Lipschutz J

## 2019-11-02 NOTE — Telephone Encounter (Signed)
Non-Urgent Medical Question  Elie Goody, MD 1 minute ago (10:40 AM)   I am a new patient being seen Monday 12/14.  I was referred to you by Dr Marlou Porch because my glucose was 625. Until I can been seen by you when should I be concerned and go to the hospital? My wife has been checking my levels throughout the day. My morning fasting is around 230. Mid day it goes up to mid 400s. How high is too high and should go to the hospital?   Routing above message to Dr. Loanne Drilling for further advice.

## 2019-11-02 NOTE — Telephone Encounter (Signed)
Please add on for 1 PM today

## 2019-11-05 ENCOUNTER — Ambulatory Visit: Payer: Self-pay | Admitting: Endocrinology

## 2019-11-20 ENCOUNTER — Ambulatory Visit: Payer: No Typology Code available for payment source | Admitting: Physician Assistant

## 2019-12-05 ENCOUNTER — Other Ambulatory Visit: Payer: Self-pay

## 2019-12-07 ENCOUNTER — Ambulatory Visit (INDEPENDENT_AMBULATORY_CARE_PROVIDER_SITE_OTHER): Payer: 59 | Admitting: Endocrinology

## 2019-12-07 ENCOUNTER — Encounter: Payer: Self-pay | Admitting: Endocrinology

## 2019-12-07 VITALS — BP 106/68 | HR 89 | Ht 68.0 in | Wt 204.0 lb

## 2019-12-07 DIAGNOSIS — E119 Type 2 diabetes mellitus without complications: Secondary | ICD-10-CM

## 2019-12-07 MED ORDER — LANTUS SOLOSTAR 100 UNIT/ML ~~LOC~~ SOPN: 22.0000 [IU] | PEN_INJECTOR | SUBCUTANEOUS | 11 refills | Status: DC

## 2019-12-07 NOTE — Progress Notes (Signed)
Subjective:    Patient ID: Oscar Ruiz, male    DOB: [birth year], 44 y.o.   MRN: [id number]  HPI Pt returns for f/u of diabetes mellitus: DM type: Insulin-requiring type 2 Dx'ed: 2014 Complications: none Therapy: insulin since 2020 DKA: never Severe hypoglycemia: never Pancreatitis: never Pancreatic imaging: never  SDOH: he needs a CDL, in order to work at his job Other: he takes QD insulin, at least for now Interval history: He brings his meter with his cbg's which I have reviewed today.  cbg varies from 125-235.  There is no trend throughout the day. Past Medical History:  Diagnosis Date  . Anxiety   . Hypertension   . Seizures (HCC)   . Substance abuse (HCC)    tobacco and alcohol    Past Surgical History:  Procedure Laterality Date  . HERNIA REPAIR      Social History   Socioeconomic History  . Marital status: Married    Spouse name: Not on file  . Number of children: 2  . Years of education: Not on file  . Highest education level: Not on file  Occupational History  . Occupation: Artist: SELF  Tobacco Use  . Smoking status: Current Every Day Smoker    Packs/day: 1.00    Types: Cigarettes  . Smokeless tobacco: Former Engineer, water and Sexual Activity  . Alcohol use: Yes    Alcohol/week: 3.0 - 5.0 standard drinks    Types: 3 - 5 Cans of beer per week    Comment: was in remission for 2 years in rehab 2017  . Drug use: No  . Sexual activity: Yes    Birth control/protection: None  Other Topics Concern  . Not on file  Social History Narrative   Regular exercise-yes   Diet: Fast food, skips meals   Social Determinants of Health   Financial Resource Strain:   . Difficulty of Paying Living Expenses: Not on file  Food Insecurity:   . Worried About Programme researcher, broadcasting/film/video in the Last Year: Not on file  . Ran Out of Food in the Last Year: Not on file  Transportation Needs:   . Lack of Transportation (Medical): Not on file  . Lack of  Transportation (Non-Medical): Not on file  Physical Activity:   . Days of Exercise per Week: Not on file  . Minutes of Exercise per Session: Not on file  Stress:   . Feeling of Stress : Not on file  Social Connections:   . Frequency of Communication with Friends and Family: Not on file  . Frequency of Social Gatherings with Friends and Family: Not on file  . Attends Religious Services: Not on file  . Active Member of Clubs or Organizations: Not on file  . Attends Banker Meetings: Not on file  . Marital Status: Not on file  Intimate Partner Violence:   . Fear of Current or Ex-Partner: Not on file  . Emotionally Abused: Not on file  . Physically Abused: Not on file  . Sexually Abused: Not on file    Current Outpatient Medications on File Prior to Visit  Medication Sig Dispense Refill  . atorvastatin (LIPITOR) 40 MG tablet Take 1 tablet (40 mg total) by mouth daily. 30 tablet 11  . busPIRone (BUSPAR) 30 MG tablet Take 1 tablet (30 mg total) by mouth 2 (two) times daily. 60 tablet 1  . fenofibrate (TRICOR) 145 MG tablet Take 1 tablet (145 mg total)  by mouth daily. 30 tablet 11  . gabapentin (NEURONTIN) 600 MG tablet Take 2 capsules by mouth every morning, 1 capsule at noon, and take 2 capsules at bedtime. 150 tablet 1  . glucose blood (ONETOUCH VERIO) test strip 1 each by Other route 2 (two) times daily. And lancets 2/day 200 each 3  . hydrOXYzine (ATARAX/VISTARIL) 25 MG tablet Take 1 tablet (25 mg total) by mouth 3 (three) times daily as needed. 60 tablet 0  . metoprolol succinate (TOPROL-XL) 100 MG 24 hr tablet Take 1 tablet (100 mg total) by mouth daily. Take with or immediately following a meal. 90 tablet 3  . Omega-3 Fatty Acids (FISH OIL) 1000 MG CAPS Take 2 capsules (2,000 mg total) by mouth 2 (two) times daily.  0  . PARoxetine (PAXIL) 30 MG tablet Take 2 tablets (60 mg total) by mouth daily. 60 tablet 1   No current facility-administered medications on file prior to  visit.    Allergies  Allergen Reactions  . Wellbutrin [Bupropion]     Family History  Problem Relation Age of Onset  . Diabetes Mother   . Alcohol abuse Father   . Hyperlipidemia Father   . Coronary artery disease Father   . Heart failure Father     BP 106/68 (BP Location: Right Arm, Patient Position: Sitting, Cuff Size: Large)   Pulse 89   Ht 5\' 8"  (1.727 m)   Wt 204 lb (92.5 kg)   SpO2 93%   BMI 31.02 kg/m   Review of Systems He denies hypoglycemia.      Objective:   Physical Exam VITAL SIGNS:  See vs page GENERAL: no distress Pulses: dorsalis pedis intact bilat.   MSK: no deformity of the feet CV: no leg edema Skin:  no ulcer on the feet.  normal color and temp on the feet. Neuro: sensation is intact to touch on the feet     Assessment & Plan:  Insulin-requiring type 2 DM: he needs increased rx.   Patient Instructions  check your blood sugar twice a day.  vary the time of day when you check, between before the 3 meals, and at bedtime.  also check if you have symptoms of your blood sugar being too high or too low.  please keep a record of the readings and bring it to your next appointment here (or you can bring the meter itself).  You can write it on any piece of paper.  please call us sooner if your blood sugar goes below 70, or if you have a lot of readings over 200. Please increase the insulin to 22 units each morning. Please come back for a follow-up appointment in 6 weeks.

## 2019-12-07 NOTE — Patient Instructions (Addendum)
check your blood sugar twice a day.  vary the time of day when you check, between before the 3 meals, and at bedtime.  also check if you have symptoms of your blood sugar being too high or too low.  please keep a record of the readings and bring it to your next appointment here (or you can bring the meter itself).  You can write it on any piece of paper.  please call us sooner if your blood sugar goes below 70, or if you have a lot of readings over 200. Please increase the insulin to 22 units each morning. Please come back for a follow-up appointment in 6 weeks.

## 2019-12-20 ENCOUNTER — Encounter: Payer: Self-pay | Admitting: Physician Assistant

## 2019-12-20 ENCOUNTER — Ambulatory Visit (INDEPENDENT_AMBULATORY_CARE_PROVIDER_SITE_OTHER): Payer: No Typology Code available for payment source | Admitting: Physician Assistant

## 2019-12-20 DIAGNOSIS — F411 Generalized anxiety disorder: Secondary | ICD-10-CM

## 2019-12-20 DIAGNOSIS — F1010 Alcohol abuse, uncomplicated: Secondary | ICD-10-CM | POA: Diagnosis not present

## 2019-12-20 DIAGNOSIS — F4321 Adjustment disorder with depressed mood: Secondary | ICD-10-CM

## 2019-12-20 MED ORDER — GABAPENTIN 600 MG PO TABS: ORAL_TABLET | ORAL | 5 refills | Status: DC

## 2019-12-20 MED ORDER — BUSPIRONE HCL 30 MG PO TABS: 30.0000 mg | ORAL_TABLET | Freq: Two times a day (BID) | ORAL | 5 refills | Status: AC

## 2019-12-20 MED ORDER — PAROXETINE HCL 30 MG PO TABS: 60.0000 mg | ORAL_TABLET | Freq: Every day | ORAL | 5 refills | Status: DC

## 2019-12-20 NOTE — Progress Notes (Signed)
[facility] Check  Patient ID: Oscar Ruiz,  MRN: [id number]  PCP: Harlene Salts, PA-C  Date of Evaluation: 12/20/2019 Time spent:20 minutes  Chief Complaint:  Chief Complaint    Anxiety; Follow-up     Virtual Visit via Telephone Note  I connected with patient by a video enabled telemedicine application or telephone, with their informed consent, and verified patient privacy and that I am speaking with the correct person using two identifiers.  I am private, in my office and the patient is at work.  I discussed the limitations, risks, security and privacy concerns of performing an evaluation and management service by telephone and the availability of in person appointments. I also discussed with the patient that there may be a patient responsible charge related to this service. The patient expressed understanding and agreed to proceed.   I discussed the assessment and treatment plan with the patient. The patient was provided an opportunity to ask questions and all were answered. The patient agreed with the plan and demonstrated an understanding of the instructions.   The patient was advised to call back or seek an in-person evaluation if the symptoms worsen or if the condition fails to improve as anticipated.  I provided 20 minutes of non-face-to-face time during this encounter.  HISTORY/CURRENT STATUS: HPI for routine med check.  Patient complains of anxiety.  States it is a lot worse since I took him off the Klonopin 2 months ago.  He weaned off over several weeks time and had no withdrawals.  He says that hydroxyzine makes him sleepy and does not work.  States he has been on Klonopin since 2007 and he drank alcohol with it all throughout that time.  "Nobody ever told me it was bad to do that until you would not give the Klonopin to me anymore.  I am not drinking as much as I did.  Back at home now and I have got a job and things are better, but I still get anxious and  nothing helps but the Klonopin."  Reports drinking a case to 1.5 cases of beer a week.  Anxiety is described as a sense of doom like something bad is going to happen.  He feels that way every day.  He occasionally has panic attacks.  The hydroxyzine has only made him sleepy and has not helped.  Patient denies loss of interest in usual activities and is able to enjoy things.  Denies decreased energy or motivation.  Appetite has not changed.  No extreme sadness, tearfulness, or feelings of hopelessness.  Denies any changes in concentration, making decisions or remembering things.  Denies suicidal or homicidal thoughts.  Patient denies increased energy with decreased need for sleep, no increased talkativeness, no racing thoughts, no impulsivity or risky behaviors, no increased spending, no increased libido, no grandiosity.  Denies dizziness, syncope, seizures, numbness, tingling, tremor, tics, unsteady gait, slurred speech, confusion. Denies muscle or joint pain, stiffness, or dystonia.  Individual Medical History/ Review of Systems: Changes? :Yes New diagnosis of diabetes.  Past medications for mental health diagnoses include: uncertain  Allergies: Wellbutrin [bupropion]  Current Medications:  Current Outpatient Medications:  .  atorvastatin (LIPITOR) 40 MG tablet, Take 1 tablet (40 mg total) by mouth daily., Disp: 30 tablet, Rfl: 11 .  busPIRone (BUSPAR) 30 MG tablet, Take 1 tablet (30 mg total) by mouth 2 (two) times daily., Disp: 60 tablet, Rfl: 5 .  fenofibrate (TRICOR) 145 MG tablet, Take 1 tablet (145 mg total) by  mouth daily., Disp: 30 tablet, Rfl: 11 .  gabapentin (NEURONTIN) 600 MG tablet, Take 1 capsules by mouth every morning, 1 capsule at noon, and take 2 capsules at bedtime., Disp: 120 tablet, Rfl: 5 .  glucose blood (ONETOUCH VERIO) test strip, 1 each by Other route 2 (two) times daily. And lancets 2/day, Disp: 200 each, Rfl: 3 .  Insulin Glargine (LANTUS SOLOSTAR) 100 UNIT/ML  Solostar Pen, Inject 22 Units into the skin every morning. And pen needles 1/day, Disp: 15 mL, Rfl: 11 .  metoprolol succinate (TOPROL-XL) 100 MG 24 hr tablet, Take 1 tablet (100 mg total) by mouth daily. Take with or immediately following a meal., Disp: 90 tablet, Rfl: 3 .  Omega-3 Fatty Acids (FISH OIL) 1000 MG CAPS, Take 2 capsules (2,000 mg total) by mouth 2 (two) times daily., Disp: , Rfl: 0 .  PARoxetine (PAXIL) 30 MG tablet, Take 2 tablets (60 mg total) by mouth daily., Disp: 60 tablet, Rfl: 5 Medication Side Effects: none  Family Medical/ Social History: Changes? Yes he has moved back home with wife and he has a new job driving a dump truck.  MENTAL HEALTH EXAM:  There were no vitals taken for this visit.There is no height or weight on file to calculate BMI.  General Appearance: Unable to assess  Eye Contact:  Unable to assess  Speech:  Clear and Coherent  Volume:  Normal  Mood:  Irritable  Affect:  Unable to assess  Thought Process:  Goal Directed and Descriptions of Associations: Intact  Orientation:  Full (Time, Place, and Person)  Thought Content: Logical   Suicidal Thoughts:  No  Homicidal Thoughts:  No  Memory:  WNL  Judgement:  Good  Insight:  Good  Psychomotor Activity:  Unable to assess  Concentration:  Concentration: Good  Recall:  Good  Fund of Knowledge: Good  Language: Good  Assets:  Desire for Improvement  ADL's:  Intact  Cognition: WNL  Prognosis:  Good    DIAGNOSES:    ICD-10-CM   1. Generalized anxiety disorder  F41.1   2. Situational depression  F43.21   3. Alcohol abuse  F10.10     Receiving Psychotherapy: No    RECOMMENDATIONS:  PDMP was reviewed. I spent 20 minutes with him on the phone. I am not prescribing a benzodiazepine knowing he is drinking alcohol like he does.  I told him 2 or 3 times that there are other alternative medications we can use to help treat and/or prevent anxiety.  We could increase the gabapentin, or change Paxil to  a different drug.  There are other options as well.  He stated "nah, let's just leave everything the same since you ain't going to give me what I want." Continue BuSpar 30 mg 1 p.o. twice daily. Continue gabapentin 600 mg, 1 p.o. every morning, 1 at noon, and 2 p.o. nightly. Continue Paxil 30 mg, 2 p.o. daily. Discontinue hydroxyzine because it is ineffective. Recommend counseling. Return in 6 months.  Melony Overly, PA-C

## 2019-12-25 ENCOUNTER — Other Ambulatory Visit: Payer: Self-pay | Admitting: Cardiology

## 2019-12-25 DIAGNOSIS — R000 Tachycardia, unspecified: Secondary | ICD-10-CM

## 2019-12-25 DIAGNOSIS — I10 Essential (primary) hypertension: Secondary | ICD-10-CM

## 2019-12-25 MED ORDER — METOPROLOL SUCCINATE ER 100 MG PO TB24: 100.0000 mg | ORAL_TABLET | Freq: Every day | ORAL | 11 refills | Status: DC

## 2019-12-25 NOTE — Telephone Encounter (Signed)
Pt's medication was sent to pt's pharmacy as requested. Confirmation received.  °

## 2020-01-25 ENCOUNTER — Ambulatory Visit: Payer: 59 | Admitting: Endocrinology

## 2020-01-28 ENCOUNTER — Encounter: Payer: Self-pay | Admitting: *Deleted

## 2020-01-28 ENCOUNTER — Telehealth: Payer: Self-pay | Admitting: Cardiology

## 2020-01-28 NOTE — Telephone Encounter (Signed)
° °  Pt said, from his last visit Dr. Anne Fu put on his records that he has coronary heart disease and seizures, he's confused since during his visit Dr. Anne Fu said his heart is fine, and the seizures that was 30 years ago and never had one recently. 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 Pt wanted to discuss this with Dr. Caro Hight as soon as possible.  Please call

## 2020-01-28 NOTE — Telephone Encounter (Signed)
Spoke with patient who is calling with concern related to medical dxs in his chart.  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  Advised pt there is no documentation in his chart of a personally history of CAD.  There is documentation of a family history (father) of CAD.  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   Pt request I call the provider who he saw today to clarify with her that he does not have a history of CAD.  He reported she works at [employer], Kathreen Devoid and phone # [phone].  I was also able to see the note completed by Clemetine Marker, NP today at 1:15 pm that included CAD in her note. I did as requested of the patient to clarify he does not have CAD.   I called and spoke with Ms Lucretia Roers and explained my concerns.  She requested we send medical clearance for the pt to be able to drive.  I asked for her to please fax a letter that details the information that would need to be included in the clearance (since we do not do DOT physical) and so that nothing would be left out.  She became very frustrated AEB her increased loudness and tone of voice.  I asked her to please calm down and not yell at me - I only want to make sure we include what is needed in the letter.  Ms Lucretia Roers then disconnected the call.    I did speak with the patient to notify him, as requested,  that I attempted to clarify her documentation however she became frustrated and hung up on me.  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  Advised pt I will have him review his chart and proceed as instructed by MD.  Pt aware I will c/b to make him aware  of when the letter has been completed.    Spoke with Dr Anne Fu who did review the pt's chart.  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    Pt is aware a letter will be completed and left on the first floor at check-in.

## 2020-01-29 ENCOUNTER — Ambulatory Visit (INDEPENDENT_AMBULATORY_CARE_PROVIDER_SITE_OTHER): Payer: 59 | Admitting: Endocrinology

## 2020-01-29 ENCOUNTER — Ambulatory Visit: Payer: 59

## 2020-01-29 ENCOUNTER — Encounter: Payer: Self-pay | Admitting: Endocrinology

## 2020-01-29 ENCOUNTER — Other Ambulatory Visit: Payer: Self-pay

## 2020-01-29 VITALS — BP 130/88 | HR 77 | Ht 68.0 in | Wt 214.8 lb

## 2020-01-29 DIAGNOSIS — E119 Type 2 diabetes mellitus without complications: Secondary | ICD-10-CM

## 2020-01-29 LAB — POCT GLYCOSYLATED HEMOGLOBIN (HGB A1C): Hemoglobin A1C: 7 % — AB (ref 4.0–5.6)

## 2020-01-29 MED ORDER — LANTUS SOLOSTAR 100 UNIT/ML ~~LOC~~ SOPN: 18.0000 [IU] | PEN_INJECTOR | SUBCUTANEOUS | 11 refills | Status: DC

## 2020-01-29 NOTE — [facility] Notes (Signed)
Subjective:    Patient ID: Oscar Ruiz, male    DOB: [birth year], 44 y.o.   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  Other: he takes QD insulin, at least for now Interval history: He brings his meter with his cbg's which I have reviewed today.  cbg varies from 82-195.  There is no trend throughout the day.  Past Medical History:  Diagnosis Date  . Anxiety   . Hypertension   . Seizures (HCC)   . Substance abuse (HCC)    tobacco and alcohol    Past Surgical History:  Procedure Laterality Date  . HERNIA REPAIR      Social History   Socioeconomic History  . Marital status: Married    Spouse name: Not on file  . Number of children: 2  . Years of education: Not on file  . Highest education level: Not on file  Occupational History  . Occupation: Artist: SELF  Tobacco Use  . Smoking status: Current Every Day Smoker    Packs/day: 2.00    Types: Cigarettes  . Smokeless tobacco: Former Engineer, water and Sexual Activity  . Alcohol use: Yes    Alcohol/week: 36.0 standard drinks    Types: 36 Cans of beer per week    Comment: was in remission for 2 years in rehab 2017  . Drug use: No  . Sexual activity: Yes    Birth control/protection: None  Other Topics Concern  . Not on file  Social History Narrative   Regular exercise-yes   Diet: Fast food, skips meals   Social Determinants of Health   Financial Resource Strain:   . Difficulty of Paying Living Expenses:   Food Insecurity:   . Worried About Programme researcher, broadcasting/film/video in the Last Year:   . Barista in the Last Year:   Transportation Needs:   . Freight forwarder (Medical):   Marland Kitchen Lack of Transportation (Non-Medical):   Physical Activity:    . Days of Exercise per Week:   . Minutes of Exercise per Session:   Stress:   . Feeling of Stress :   Social Connections:   . Frequency of Communication with Friends and Family:   . Frequency of Social Gatherings with Friends and Family:   . Attends Religious Services:   . Active Member of Clubs or Organizations:   . Attends Banker Meetings:   Marland Kitchen Marital Status:   Intimate Partner Violence:   . Fear of Current or Ex-Partner:   . Emotionally Abused:   Marland Kitchen Physically Abused:   . Sexually Abused:     Current Outpatient Medications on File Prior to Visit  Medication Sig Dispense Refill  . atorvastatin (LIPITOR) 40 MG tablet Take 1 tablet (40 mg total) by mouth daily. 30 tablet 11  . busPIRone (BUSPAR) 30 MG tablet Take 1 tablet (30 mg total) by mouth 2 (two) times daily. 60 tablet 5  . fenofibrate (TRICOR) 145 MG tablet Take 1 tablet (145 mg total) by mouth daily. 30 tablet 11  . gabapentin (NEURONTIN) 600 MG tablet Take 1 capsules by mouth every morning, 1 capsule at noon, and take 2 capsules at bedtime. 120 tablet 5  . glucose blood (ONETOUCH  VERIO) test strip 1 each by Other route 2 (two) times daily. And lancets 2/day 200 each 3  . metoprolol succinate (TOPROL-XL) 100 MG 24 hr tablet Take 1 tablet (100 mg total) by mouth daily. Take with or immediately following a meal. 30 tablet 11  . Omega-3 Fatty Acids (FISH OIL) 1000 MG CAPS Take 2 capsules (2,000 mg total) by mouth 2 (two) times daily.  0  . PARoxetine (PAXIL) 30 MG tablet Take 2 tablets (60 mg total) by mouth daily. 60 tablet 5   No current facility-administered medications on file prior to visit.    Allergies  Allergen Reactions  . Wellbutrin [Bupropion]     Family History  Problem Relation Age of Onset  . Diabetes Mother   . Alcohol abuse Father   . Hyperlipidemia Father   . Coronary artery disease Father   . Heart failure Father     BP 130/88 (BP Location: Left Arm, Patient Position: Sitting, Cuff  Size: Large)   Pulse 77   Ht 5\' 8"  (1.727 m)   Wt 214 lb 12.8 oz (97.4 kg)   SpO2 96%   BMI 32.66 kg/m    Review of Systems He denies hypoglycemia.      Objective:   Physical Exam VITAL SIGNS:  See vs page GENERAL: no distress Pulses: dorsalis pedis intact bilat.   MSK: no deformity of the feet CV: no leg edema Skin:  no ulcer on the feet.  normal color and temp on the feet. Neuro: sensation is intact to touch on the feet   Lab Results  Component Value Date   HGBA1C 7.0 (A) 01/29/2020       Assessment & Plan:  Insulin-requiring type 2 DM: overcontrolled, given this regimen, which does match insulin to her changing needs throughout the day  Patient Instructions  check your blood sugar twice a day.  vary the time of day when you check, between before the 3 meals, and at bedtime.  also check if you have symptoms of your blood sugar being too high or too low.  please keep a record of the readings and bring it to your next appointment here (or you can bring the meter itself).  You can write it on any piece of paper.  please call us sooner if your blood sugar goes below 70, or if you have a lot of readings over 200. Please reduce the insulin to 18 units each morning. Please come back for a follow-up appointment in 2 months.

## 2020-01-29 NOTE — Patient Instructions (Addendum)
check your blood sugar twice a day.  vary the time of day when you check, between before the 3 meals, and at bedtime.  also check if you have symptoms of your blood sugar being too high or too low.  please keep a record of the readings and bring it to your next appointment here (or you can bring the meter itself).  You can write it on any piece of paper.  please call us sooner if your blood sugar goes below 70, or if you have a lot of readings over 200. Please reduce the insulin to 18 units each morning. Please come back for a follow-up appointment in 2 months.

## 2020-02-13 ENCOUNTER — Ambulatory Visit: Payer: 59 | Admitting: Endocrinology

## 2020-02-29 ENCOUNTER — Ambulatory Visit: Payer: 59 | Admitting: Endocrinology

## 2020-04-02 ENCOUNTER — Other Ambulatory Visit: Payer: Self-pay

## 2020-04-04 ENCOUNTER — Encounter: Payer: Self-pay | Admitting: Endocrinology

## 2020-04-04 ENCOUNTER — Other Ambulatory Visit: Payer: Self-pay

## 2020-04-04 ENCOUNTER — Ambulatory Visit (INDEPENDENT_AMBULATORY_CARE_PROVIDER_SITE_OTHER): Payer: 59 | Admitting: Endocrinology

## 2020-04-04 VITALS — BP 124/80 | HR 87 | Ht 68.0 in | Wt 219.0 lb

## 2020-04-04 DIAGNOSIS — E119 Type 2 diabetes mellitus without complications: Secondary | ICD-10-CM

## 2020-04-04 LAB — POCT GLYCOSYLATED HEMOGLOBIN (HGB A1C): Hemoglobin A1C: 6.8 % — AB (ref 4.0–5.6)

## 2020-04-04 MED ORDER — LANTUS SOLOSTAR 100 UNIT/ML ~~LOC~~ SOPN: 12.0000 [IU] | PEN_INJECTOR | SUBCUTANEOUS | 11 refills | Status: AC

## 2020-04-04 NOTE — Patient Instructions (Addendum)
check your blood sugar twice a day.  vary the time of day when you check, between before the 3 meals, and at bedtime.  also check if you have symptoms of your blood sugar being too high or too low.  please keep a record of the readings and bring it to your next appointment here (or you can bring the meter itself).  You can write it on any piece of paper.  please call us sooner if your blood sugar goes below 70, or if you have a lot of readings over 200. If the blood sugar goes high, we'll add metformin.   Please reduce the insulin to 12 units each morning.  Please come back for a follow-up appointment in 2 months.

## 2020-04-04 NOTE — [facility] Notes (Signed)
Subjective:    Patient ID: Oscar Ruiz, male    DOB: 09/07/1976, 44 y.o.   MRN: [id number]  HPI Pt returns for f/u of diabetes mellitus: DM type: Insulin-requiring type 2 Dx'ed: 2014 Complications: none Therapy: insulin since 2020 DKA: never Severe hypoglycemia: never Pancreatitis: never.   Pancreatic imaging: never.   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  Other: he takes QD insulin, at least for now.   Interval history: He brings his meter with his cbg's which I have reviewed today.  cbg varies from 97-137.  There is no trend throughout the day.   Past Medical History:  Diagnosis Date  . Anxiety   . Hypertension   . Seizures (HCC)   . Substance abuse (HCC)    tobacco and alcohol    Past Surgical History:  Procedure Laterality Date  . HERNIA REPAIR      Social History   Socioeconomic History  . Marital status: Married    Spouse name: Not on file  . Number of children: 2  . Years of education: Not on file  . Highest education level: Not on file  Occupational History  . Occupation: Artist: SELF  Tobacco Use  . Smoking status: Current Every Day Smoker    Packs/day: 2.00    Types: Cigarettes  . Smokeless tobacco: Former Engineer, water and Sexual Activity  . Alcohol use: Yes    Alcohol/week: 36.0 standard drinks    Types: 36 Cans of beer per week    Comment: was in remission for 2 years in rehab 2017  . Drug use: No  . Sexual activity: Yes    Birth control/protection: None  Other Topics Concern  . Not on file  Social History Narrative   Regular exercise-yes   Diet: Fast food, skips meals   Social Determinants of Health   Financial Resource Strain:   . Difficulty of Paying Living Expenses:   Food Insecurity:   . Worried About Programme researcher, broadcasting/film/video in the Last Year:   . Barista in the Last Year:   Transportation Needs:   . Freight forwarder (Medical):   Marland Kitchen Lack of Transportation (Non-Medical):   Physical  Activity:   . Days of Exercise per Week:   . Minutes of Exercise per Session:   Stress:   . Feeling of Stress :   Social Connections:   . Frequency of Communication with Friends and Family:   . Frequency of Social Gatherings with Friends and Family:   . Attends Religious Services:   . Active Member of Clubs or Organizations:   . Attends Banker Meetings:   Marland Kitchen Marital Status:   Intimate Partner Violence:   . Fear of Current or Ex-Partner:   . Emotionally Abused:   Marland Kitchen Physically Abused:   . Sexually Abused:     Current Outpatient Medications on File Prior to Visit  Medication Sig Dispense Refill  . atorvastatin (LIPITOR) 40 MG tablet Take 1 tablet (40 mg total) by mouth daily. 30 tablet 11  . busPIRone (BUSPAR) 30 MG tablet Take 1 tablet (30 mg total) by mouth 2 (two) times daily. 60 tablet 5  . fenofibrate (TRICOR) 145 MG tablet Take 1 tablet (145 mg total) by mouth daily. 30 tablet 11  . gabapentin (NEURONTIN) 600 MG tablet Take 1 capsules by mouth every morning, 1 capsule at noon, and take 2 capsules at bedtime. 120 tablet  5  . glucose blood (ONETOUCH VERIO) test strip 1 each by Other route 2 (two) times daily. And lancets 2/day 200 each 3  . metoprolol succinate (TOPROL-XL) 100 MG 24 hr tablet Take 1 tablet (100 mg total) by mouth daily. Take with or immediately following a meal. 30 tablet 11  . Omega-3 Fatty Acids (FISH OIL) 1000 MG CAPS Take 2 capsules (2,000 mg total) by mouth 2 (two) times daily.  0  . PARoxetine (PAXIL) 30 MG tablet Take 2 tablets (60 mg total) by mouth daily. 60 tablet 5   No current facility-administered medications on file prior to visit.    Allergies  Allergen Reactions  . Wellbutrin [Bupropion]     Family History  Problem Relation Age of Onset  . Diabetes Mother   . Alcohol abuse Father   . Hyperlipidemia Father   . Coronary artery disease Father   . Heart failure Father     BP 124/80   Pulse 87   Ht 5\' 8"  (1.727 m)   Wt 219 lb  (99.3 kg)   SpO2 98%   BMI 33.30 kg/m    Review of Systems He denies hypoglycemia.      Objective:   Physical Exam VITAL SIGNS:  See vs page GENERAL: no distress Pulses: dorsalis pedis intact bilat.   MSK: no deformity of the feet CV: no leg edema Skin:  no ulcer on the feet.  normal color and temp on the feet. Neuro: sensation is intact to touch on the feet  Lab Results  Component Value Date   HGBA1C 6.8 (A) 04/04/2020   Lab Results  Component Value Date   CREATININE 1.26 10/31/2019   BUN 9 10/31/2019   NA 130 (L) 10/31/2019   K 5.7 (H) 10/31/2019   CL 81 (L) 10/31/2019   CO2 20 10/31/2019       Assessment & Plan:  Insulin-requiring type 2 DM: overcontrolled, given this insulin regimen, which does match insulin to his changing needs throughout the day.  Occupational status: he needs to phase out insulin if possible.  I told pt this is our goal  Patient Instructions  check your blood sugar twice a day.  vary the time of day when you check, between before the 3 meals, and at bedtime.  also check if you have symptoms of your blood sugar being too high or too low.  please keep a record of the readings and bring it to your next appointment here (or you can bring the meter itself).  You can write it on any piece of paper.  please call us sooner if your blood sugar goes below 70, or if you have a lot of readings over 200. If the blood sugar goes high, we'll add metformin.   Please reduce the insulin to 12 units each morning.  Please come back for a follow-up appointment in 2 months.

## 2020-05-02 ENCOUNTER — Emergency Department (HOSPITAL_COMMUNITY)
Admission: EM | Admit: 2020-05-02 | Discharge: 2020-05-02 | Disposition: A | Discharge status: Home / Self Care | Payer: No Typology Code available for payment source | Attending: Emergency Medicine | Admitting: Emergency Medicine

## 2020-05-02 ENCOUNTER — Encounter (HOSPITAL_COMMUNITY): Payer: Self-pay | Admitting: *Deleted

## 2020-05-02 ENCOUNTER — Emergency Department (HOSPITAL_COMMUNITY): Payer: No Typology Code available for payment source

## 2020-05-02 DIAGNOSIS — Z79899 Other long term (current) drug therapy: Secondary | ICD-10-CM | POA: Insufficient documentation

## 2020-05-02 DIAGNOSIS — F17210 Nicotine dependence, cigarettes, uncomplicated: Secondary | ICD-10-CM | POA: Insufficient documentation

## 2020-05-02 DIAGNOSIS — E785 Hyperlipidemia, unspecified: Secondary | ICD-10-CM | POA: Insufficient documentation

## 2020-05-02 DIAGNOSIS — I10 Essential (primary) hypertension: Secondary | ICD-10-CM | POA: Insufficient documentation

## 2020-05-02 DIAGNOSIS — R1031 Right lower quadrant pain: Secondary | ICD-10-CM | POA: Insufficient documentation

## 2020-05-02 DIAGNOSIS — E119 Type 2 diabetes mellitus without complications: Secondary | ICD-10-CM | POA: Insufficient documentation

## 2020-05-02 DIAGNOSIS — Z794 Long term (current) use of insulin: Secondary | ICD-10-CM | POA: Diagnosis not present

## 2020-05-02 HISTORY — DX: Type 2 diabetes mellitus without complications: E11.9

## 2020-05-02 LAB — BASIC METABOLIC PANEL
Anion gap: 11 (ref 5–15)
BUN: 14 mg/dL (ref 6–20)
CO2: 21 mmol/L — ABNORMAL LOW (ref 22–32)
Calcium: 8.9 mg/dL (ref 8.9–10.3)
Chloride: 94 mmol/L — ABNORMAL LOW (ref 98–111)
Creatinine, Ser: 1.18 mg/dL (ref 0.61–1.24)
GFR calc Af Amer: >60 mL/min (ref >60)
GFR calc non Af Amer: >60 mL/min (ref >60)
Glucose, Bld: 105 mg/dL — ABNORMAL HIGH (ref 70–99)
Potassium: 4.1 mmol/L (ref 3.5–5.1)
Sodium: 126 mmol/L — ABNORMAL LOW (ref 135–145)

## 2020-05-02 LAB — CBC WITH DIFFERENTIAL/PLATELET
Abs Immature Granulocytes: 0.04 10*3/uL (ref 0.00–0.07)
Basophils Absolute: 0 10*3/uL (ref 0.0–0.1)
Basophils Relative: 0 %
Eosinophils Absolute: 0.1 10*3/uL (ref 0.0–0.5)
Eosinophils Relative: 1 %
HCT: 44.1 % (ref 39.0–52.0)
Hemoglobin: 15.9 g/dL (ref 13.0–17.0)
Immature Granulocytes: 0 %
Lymphocytes Relative: 27 %
Lymphs Abs: 2.8 10*3/uL (ref 0.7–4.0)
MCH: 31.4 pg (ref 26.0–34.0)
MCHC: 36.1 g/dL — ABNORMAL HIGH (ref 30.0–36.0)
MCV: 87.2 fL (ref 80.0–100.0)
Monocytes Absolute: 0.7 10*3/uL (ref 0.1–1.0)
Monocytes Relative: 7 %
Neutro Abs: 6.6 10*3/uL (ref 1.7–7.7)
Neutrophils Relative %: 65 %
Platelets: 160 10*3/uL (ref 150–400)
RBC: 5.06 MIL/uL (ref 4.22–5.81)
RDW: 12.2 % (ref 11.5–15.5)
WBC: 10.3 10*3/uL (ref 4.0–10.5)
nRBC: 0 % (ref 0.0–0.2)

## 2020-05-02 LAB — URINALYSIS, ROUTINE W REFLEX MICROSCOPIC
Bilirubin Urine: NEGATIVE
Glucose, UA: NEGATIVE mg/dL
Hgb urine dipstick: NEGATIVE
Ketones, ur: NEGATIVE mg/dL
Leukocytes,Ua: NEGATIVE
Nitrite: NEGATIVE
Protein, ur: NEGATIVE mg/dL
Specific Gravity, Urine: 1.01 (ref 1.005–1.030)
pH: 6 (ref 5.0–8.0)

## 2020-05-02 MED ORDER — ONDANSETRON HCL 4 MG/2ML IJ SOLN
4.0000 mg | Freq: Once | INTRAMUSCULAR | Status: AC
  Administered 2020-05-02: 4 mg via INTRAVENOUS
  Filled 2020-05-02: qty 2

## 2020-05-02 MED ORDER — HYDROMORPHONE HCL 1 MG/ML IJ SOLN
1.0000 mg | Freq: Once | INTRAMUSCULAR | Status: AC
  Administered 2020-05-02: 1 mg via INTRAVENOUS
  Filled 2020-05-02: qty 1

## 2020-05-02 MED ORDER — SODIUM CHLORIDE 0.9 % IV SOLN: INTRAVENOUS | Status: DC

## 2020-05-02 MED ORDER — SODIUM CHLORIDE 0.9 % IV BOLUS
1000.0000 mL | Freq: Once | INTRAVENOUS | Status: AC
  Administered 2020-05-02: 1000 mL via INTRAVENOUS

## 2020-05-02 MED ORDER — HYDROCODONE-ACETAMINOPHEN 5-325 MG PO TABS: 1.0000 | ORAL_TABLET | Freq: Four times a day (QID) | ORAL | 0 refills | Status: DC | PRN

## 2020-05-02 NOTE — ED Triage Notes (Signed)
Right flank pain radiating into abdomen

## 2020-05-02 NOTE — Discharge Instructions (Signed)
Work-up on the CT scan and labs without significant finding.  Take pain medicine as needed.  Follow-up with primary care doctor if symptoms have not improved by Monday.  Work note provided.  Return for any new or worse symptoms.

## 2020-05-02 NOTE — ED Provider Notes (Signed)
[facility] EMERGENCY DEPARTMENT Provider Note   CSN: [id number] Arrival date & time: 05/02/20  1430     History Chief Complaint  Patient presents with   Abdominal Pain   Flank Pain    Oscar Ruiz is a 44 y.o. male.  With the onset of some right CVA pain first thing this morning.  By midmorning it moved to the right lower quadrant.  No nausea or vomiting.  No dysuria no hematuria no fevers.  No history of kidney stones.        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/21/2013   Generalized anxiety disorder 04/27/2013   GOUT, UNSPECIFIED 01/06/2011   HYPERLIPIDEMIA 05/07/2009   PREDIABETES 05/07/2009   LIVER FUNCTION TESTS, ABNORMAL 05/31/2008   PANIC ATTACK 05/20/2008   TOBACCO ABUSE 05/20/2008   Essential hypertension, benign 05/20/2008   LOW BACK PAIN, CHRONIC 01/02/2008    Past Surgical History:  Procedure Laterality Date   HERNIA REPAIR         Family History  Problem Relation Age of Onset   Diabetes Mother    Alcohol abuse Father    Hyperlipidemia Father    Coronary artery disease Father    Heart failure Father     Social History   Tobacco Use   Smoking status: Current Every Day Smoker    Packs/day: 2.00    Types: Cigarettes   Smokeless tobacco: Former Forensic psychologist Use: Never used  Substance Use Topics   Alcohol use: Yes    Alcohol/week: 36.0 standard drinks    Types: 36 Cans of beer per week    Comment: was in remission for 2 years in rehab 2017   Drug use: No    Home Medications Prior to  Admission medications   Medication Sig Start Date End Date Taking? Authorizing Provider  atorvastatin (LIPITOR) 40 MG tablet Take 1 tablet (40 mg total) by mouth daily. 11/01/19  Yes Jake Bathe, MD  busPIRone (BUSPAR) 30 MG tablet Take 1 tablet (30 mg total) by mouth 2 (two) times daily. 12/20/19  Yes Melony Overly T, PA-C  desmopressin (DDAVP) 0.2 MG tablet Take 200 mcg by mouth at bedtime. 05/02/20  Yes [provider]  fenofibrate (TRICOR) 145 MG tablet Take 1 tablet (145 mg total) by mouth daily. 11/01/19  Yes Jake Bathe, MD  gabapentin (NEURONTIN) 600 MG tablet Take 1 capsules by mouth every morning, 1 capsule at noon, and take 2 capsules at bedtime. Patient taking differently: Take 600-1,200 mg by mouth See admin instructions. Take 1 capsules by mouth every morning, 1 capsule at noon, and take 2 capsules at bedtime. 12/20/19  Yes Hurst, Rosey Bath T, PA-C  ibuprofen (ADVIL) 200 MG tablet Take 400 mg by mouth every 6 (six) hours as needed for mild pain or moderate pain.   Yes [provider]  insulin glargine (LANTUS SOLOSTAR) 100 UNIT/ML Solostar Pen Inject 12 Units into the skin every morning. And pen needles 1/day 04/04/20  Yes Romero Belling, MD  metoprolol succinate (TOPROL-XL) 100  MG 24 hr tablet Take 1 tablet (100 mg total) by mouth daily. Take with or immediately following a meal. 12/25/19  Yes Skains, Veverly Fells, MD  Omega-3 Fatty Acids (FISH OIL) 1000 MG CAPS Take 2 capsules (2,000 mg total) by mouth 2 (two) times daily. 11/01/19  Yes Jake Bathe, MD  PARoxetine (PAXIL) 30 MG tablet Take 2 tablets (60 mg total) by mouth daily. 12/20/19  Yes Hurst, Glade Nurse, PA-C  HYDROcodone-acetaminophen (NORCO/VICODIN) 5-325 MG tablet Take 1 tablet by mouth every 6 (six) hours as needed. 05/02/20   Vanetta Mulders, MD    Allergies    Wellbutrin [bupropion]  Review of Systems   Review of Systems  Constitutional: Negative for chills and fever.  HENT: Negative for congestion,  rhinorrhea and sore throat.   Eyes: Negative for visual disturbance.  Respiratory: Negative for cough and shortness of breath.   Cardiovascular: Negative for chest pain and leg swelling.  Gastrointestinal: Positive for abdominal pain. Negative for diarrhea, nausea and vomiting.  Genitourinary: Positive for flank pain. Negative for dysuria.  Musculoskeletal: Positive for back pain. Negative for neck pain.  Skin: Negative for rash.  Neurological: Negative for dizziness, light-headedness and headaches.  Hematological: Does not bruise/bleed easily.  Psychiatric/Behavioral: Negative for confusion.    Physical Exam Updated Vital Signs BP (!) 173/92    Pulse 80    Temp 97.9 F (36.6 C)    Resp 18    Ht 1.727 m (5\' 8" )    Wt 102.4 kg    SpO2 96%    BMI 34.33 kg/m   Physical Exam Vitals and nursing note reviewed.  Constitutional:      Appearance: Normal appearance. He is well-developed.  HENT:     Head: Normocephalic and atraumatic.  Eyes:     Extraocular Movements: Extraocular movements intact.     Conjunctiva/sclera: Conjunctivae normal.     Pupils: Pupils are equal, round, and reactive to light.  Cardiovascular:     Rate and Rhythm: Normal rate and regular rhythm.     Heart sounds: No murmur heard.   Pulmonary:     Effort: Pulmonary effort is normal. No respiratory distress.     Breath sounds: Normal breath sounds.  Abdominal:     Palpations: Abdomen is soft.     Tenderness: There is no abdominal tenderness. There is no guarding.  Musculoskeletal:        General: Normal range of motion.     Cervical back: Normal range of motion and neck supple.  Skin:    General: Skin is warm and dry.  Neurological:     General: No focal deficit present.     Mental Status: He is alert and oriented to person, place, and time.     Cranial Nerves: No cranial nerve deficit.     Sensory: No sensory deficit.     Motor: No weakness.     ED Results / Procedures / Treatments   Labs (all labs  ordered are listed, but only abnormal results are displayed) Labs Reviewed  CBC WITH DIFFERENTIAL/PLATELET - Abnormal; Notable for the following components:      Result Value   MCHC 36.1 (*)    All other components within normal limits  BASIC METABOLIC PANEL - Abnormal; Notable for the following components:   Sodium 126 (*)    Chloride 94 (*)    CO2 21 (*)    Glucose, Bld 105 (*)    All other components within normal limits  URINALYSIS, ROUTINE  W REFLEX MICROSCOPIC    EKG None  Radiology CT Renal Stone Study  Result Date: 05/02/2020 CLINICAL DATA:  Flank pain on the right EXAM: CT ABDOMEN AND PELVIS WITHOUT CONTRAST TECHNIQUE: Multidetector CT imaging of the abdomen and pelvis was performed following the standard protocol without IV contrast. COMPARISON:  None. FINDINGS: Lower chest: No acute abnormality. Hepatobiliary: No focal liver abnormality is seen. No gallstones, gallbladder wall thickening, or biliary dilatation. Pancreas: Unremarkable. No pancreatic ductal dilatation or surrounding inflammatory changes. Spleen: Normal in size without focal abnormality. Adrenals/Urinary Tract: Adrenal glands are within normal limits. Kidneys demonstrate no renal calculi or urinary tract obstructive changes. The ureters are well visualized bilaterally without obstructive change. The bladder is well distended. Stomach/Bowel: Appendix is within normal limits. No obstructive or inflammatory changes of the larger small-bowel are seen. The stomach is unremarkable. Vascular/Lymphatic: Aortic atherosclerosis. No enlarged abdominal or pelvic lymph nodes. Reproductive: Prostate is unremarkable. Other: No abdominal wall hernia or abnormality. No abdominopelvic ascites. Musculoskeletal: No acute or significant osseous findings. IMPRESSION: No acute abnormality to correspond with the patient's given clinical symptomatology. Electronically Signed   By: Inez Catalina M.D.   On: 05/02/2020 16:42     Procedures Procedures (including critical care time)  Medications Ordered in ED Medications  0.9 %  sodium chloride infusion ( Intravenous New Bag/Given 05/02/20 1702)  sodium chloride 0.9 % bolus 1,000 mL (1,000 mLs Intravenous New Bag/Given 05/02/20 1557)  ondansetron (ZOFRAN) injection 4 mg (4 mg Intravenous Given 05/02/20 1559)  HYDROmorphone (DILAUDID) injection 1 mg (1 mg Intravenous Given 05/02/20 1559)  HYDROmorphone (DILAUDID) injection 1 mg (1 mg Intravenous Given 05/02/20 1750)    ED Course  I have reviewed the triage vital signs and the nursing notes.  Pertinent labs & imaging results that were available during my care of the patient were reviewed by me and considered in my medical decision making (see chart for details).    MDM Rules/Calculators/A&P                          Abdomen nontender.  Labs CT scan without acute findings.  No leukocytosis.  Urinalysis without evidence of infection.  Will treat symptomatically.  In particular CT scan of the abdomen showed the appendix was normal gallbladder area was normal.  No evidence of any kidney stone.  Work note provided.  Patient will follow up with primary care provider if not better by Monday.  Will return for any new or worse symptoms.  Short course of hydrocodone provided.    Final Clinical Impression(s) / ED Diagnoses Final diagnoses:  Right lower quadrant abdominal pain    Rx / DC Orders ED Discharge Orders         Ordered    HYDROcodone-acetaminophen (NORCO/VICODIN) 5-325 MG tablet  Every 6 hours PRN     Discontinue  Reprint     05/02/20 1802           Fredia Sorrow, MD 05/02/20 1805

## 2020-06-06 ENCOUNTER — Ambulatory Visit: Payer: 59 | Admitting: Endocrinology

## 2020-12-01 ENCOUNTER — Encounter: Payer: Self-pay | Admitting: Cardiology

## 2020-12-01 ENCOUNTER — Ambulatory Visit (INDEPENDENT_AMBULATORY_CARE_PROVIDER_SITE_OTHER): Payer: No Typology Code available for payment source | Admitting: Cardiology

## 2020-12-01 ENCOUNTER — Other Ambulatory Visit: Payer: Self-pay

## 2020-12-01 VITALS — BP 140/92 | HR 79 | Ht 68.0 in | Wt 218.0 lb

## 2020-12-01 DIAGNOSIS — I10 Essential (primary) hypertension: Secondary | ICD-10-CM

## 2020-12-01 DIAGNOSIS — Z720 Tobacco use: Secondary | ICD-10-CM

## 2020-12-01 DIAGNOSIS — R000 Tachycardia, unspecified: Secondary | ICD-10-CM

## 2020-12-01 MED ORDER — ATORVASTATIN CALCIUM 40 MG PO TABS: 40.0000 mg | ORAL_TABLET | Freq: Every day | ORAL | 11 refills | Status: DC

## 2020-12-01 MED ORDER — FENOFIBRATE 145 MG PO TABS: 145.0000 mg | ORAL_TABLET | Freq: Every day | ORAL | 11 refills | Status: DC

## 2020-12-01 MED ORDER — METOPROLOL SUCCINATE ER 100 MG PO TB24: 100.0000 mg | ORAL_TABLET | Freq: Every day | ORAL | 11 refills | Status: DC

## 2020-12-01 NOTE — Patient Instructions (Signed)
Medication Instructions:  The current medical regimen is effective;  continue present plan and medications.  *If you need a refill on your cardiac medications before your next appointment, please call your pharmacy*  Follow-Up: At [facility], you and your health needs are our priority.  As part of our continuing mission to provide you with exceptional heart care, we have created designated Provider Care Teams.  These Care Teams include your primary Cardiologist (physician) and Advanced Practice Providers (APPs -  Physician Assistants and Nurse Practitioners) who all work together to provide you with the care you need, when you need it.  We recommend signing up for the patient portal called "MyChart".  Sign up information is provided on this After Visit Summary.  MyChart is used to connect with patients for Virtual Visits (Telemedicine).  Patients are able to view lab/test results, encounter notes, upcoming appointments, etc.  Non-urgent messages can be sent to your provider as well.   To learn more about what you can do with MyChart, go to [email or web address].    Your next appointment:   12 month(s)  The format for your next appointment:   In Person  Provider:   Mark Skains, MD   Thank you for choosing [facility]!!      

## 2020-12-01 NOTE — Progress Notes (Signed)
Cardiology Office Note:    Date:  12/01/2020   ID:  Oscar Ruiz, DOB 03/06/1976, MRN [id number]  PCP:  Oscar Carney, PA-C  [facility] Cardiologist:  Oscar Schultz, MD  [facility] Electrophysiologist:  None   Referring MD: No ref. provider found    History of Present Illness:    Oscar Ruiz is a 45 y.o. male here for the follow-up of hypertension, chest pain.  Smoker.  Has had panic/anxiety.  Treated with beta-blocker.  Past Medical History:  Diagnosis Date  . Anxiety   . Diabetes mellitus without complication (HCC)   . Hypertension   . Seizures (HCC)   . Substance abuse (HCC)    tobacco and alcohol    Past Surgical History:  Procedure Laterality Date  . HERNIA REPAIR      Current Medications: Current Meds  Medication Sig  . busPIRone (BUSPAR) 30 MG tablet Take 1 tablet (30 mg total) by mouth 2 (two) times daily.  Marland Kitchen desmopressin (DDAVP) 0.2 MG tablet Take 200 mcg by mouth at bedtime.  . gabapentin (NEURONTIN) 600 MG tablet Take 1 capsules by mouth every morning, 1 capsule at noon, and take 2 capsules at bedtime. (Patient taking differently: Take 600-1,200 mg by mouth See admin instructions. Take 1 capsules by mouth every morning, 1 capsule at noon, and take 2 capsules at bedtime.)  . insulin glargine (LANTUS SOLOSTAR) 100 UNIT/ML Solostar Pen Inject 12 Units into the skin every morning. And pen needles 1/day  . Omega-3 Fatty Acids (FISH OIL) 1000 MG CAPS Take 2 capsules (2,000 mg total) by mouth 2 (two) times daily.  Marland Kitchen PARoxetine (PAXIL) 30 MG tablet Take 2 tablets (60 mg total) by mouth daily.  . [DISCONTINUED] metoprolol succinate (TOPROL-XL) 100 MG 24 hr tablet Take 1 tablet (100 mg total) by mouth daily. Take with or immediately following a meal.     Allergies:   Wellbutrin [bupropion]   Social History   Socioeconomic History  . Marital status: Married    Spouse name: Not on file  . Number of children: 2  . Years of education: Not on file   . Highest education level: Not on file  Occupational History  . Occupation: Artist: SELF  Tobacco Use  . Smoking status: Current Every Day Smoker    Packs/day: 2.00    Types: Cigarettes  . Smokeless tobacco: Former Clinical biochemist  . Vaping Use: Never used  Substance and Sexual Activity  . Alcohol use: Yes    Alcohol/week: 36.0 standard drinks    Types: 36 Cans of beer per week    Comment: was in remission for 2 years in rehab 2017  . Drug use: No  . Sexual activity: Yes    Birth control/protection: None  Other Topics Concern  . Not on file  Social History Narrative   Regular exercise-yes   Diet: Fast food, skips meals   Social Determinants of Health   Financial Resource Strain: Not on file  Food Insecurity: Not on file  Transportation Needs: Not on file  Physical Activity: Not on file  Stress: Not on file  Social Connections: Not on file     Family History: The patient's family history includes Alcohol abuse in his father; Coronary artery disease in his father; Diabetes in his mother; Heart failure in his father; Hyperlipidemia in his father.  ROS:   Please see the history of present illness.     All other systems reviewed and  are negative.  EKGs/Labs/Other Studies Reviewed:    The following studies were reviewed today:  Echocardiogram 2015: EF normal at 55%  EKG:  EKG is  ordered today.  The ekg ordered today demonstrates sinus rhythm 79 no other abnormalities  Recent Labs: 05/02/2020: BUN 14; Creatinine, Ser 1.18; Hemoglobin 15.9; Platelets 160; Potassium 4.1; Sodium 126  Recent Lipid Panel    Component Value Date/Time   CHOL 440 (H) 10/31/2019 0953   TRIG 3,788 (HH) 10/31/2019 0953   HDL 10 (L) 10/31/2019 0953   CHOLHDL 44.0 (H) 10/31/2019 0953   CHOLHDL 7 03/14/2014 1108   VLDL 49.0 (H) 03/14/2014 1108   LDLCALC Comment (A) 10/31/2019 0953   LDLDIRECT 77.1 10/24/2013 1031     Risk Assessment/Calculations:      Physical Exam:     VS:  BP (!) 140/92   Pulse 79   Ht 5\' 8"  (1.727 m)   Wt 218 lb (98.9 kg)   BMI 33.15 kg/m     Wt Readings from Last 3 Encounters:  12/01/20 218 lb (98.9 kg)  05/02/20 225 lb 12 oz (102.4 kg)  04/04/20 219 lb (99.3 kg)     GEN:  Well nourished, well developed in no acute distress HEENT: Normal NECK: No JVD; No carotid bruits LYMPHATICS: No lymphadenopathy CARDIAC: RRR, no murmurs, rubs, gallops RESPIRATORY:  Clear to auscultation without rales, wheezing or rhonchi  ABDOMEN: Soft, non-tender, non-distended MUSCULOSKELETAL:  No edema; No deformity  SKIN: Warm and dry NEUROLOGIC:  Alert and oriented x 3 PSYCHIATRIC:  Normal affect   ASSESSMENT:    1. Tobacco abuse   2. Essential hypertension   3. Sinus tachycardia    PLAN:    In order of problems listed above:  Essential hypertension - Above normal today.  Hyperlipidemia - Triglycerides in the past 3788 on 10/31/2019.  Extremely high values.  Can lead to pancreatitis.  Alcohol cessation.  We started atorvastatin 40 mg along with fenofibrate 145.  He is also on fish oil 2000 twice a day.   -Continue with meds.  Obviously treatment of diabetes and alcohol are key.  Alcohol use - Understands importance of cessation.  He has tried rehab a couple times.  He does admit that he drinks at night.    Medication Adjustments/Labs and Tests Ordered: Current medicines are reviewed at length with the patient today.  Concerns regarding medicines are outlined above.  Orders Placed This Encounter  Procedures  . EKG 12-Lead   Meds ordered this encounter  Medications  . metoprolol succinate (TOPROL-XL) 100 MG 24 hr tablet    Sig: Take 1 tablet (100 mg total) by mouth daily. Take with or immediately following a meal.    Dispense:  30 tablet    Refill:  11  . fenofibrate (TRICOR) 145 MG tablet    Sig: Take 1 tablet (145 mg total) by mouth daily.    Dispense:  30 tablet    Refill:  11  . atorvastatin (LIPITOR) 40 MG tablet     Sig: Take 1 tablet (40 mg total) by mouth daily.    Dispense:  30 tablet    Refill:  11    Patient Instructions  Medication Instructions:  The current medical regimen is effective;  continue present plan and medications.  *If you need a refill on your cardiac medications before your next appointment, please call your pharmacy*  Follow-Up: At [facility], you and your health needs are our priority.  As part of our continuing mission  to provide you with exceptional heart care, we have created designated Provider Care Teams.  These Care Teams include your primary Cardiologist (physician) and Advanced Practice Providers (APPs -  Physician Assistants and Nurse Practitioners) who all work together to provide you with the care you need, when you need it.  We recommend signing up for the patient portal called "MyChart".  Sign up information is provided on this After Visit Summary.  MyChart is used to connect with patients for Virtual Visits (Telemedicine).  Patients are able to view lab/test results, encounter notes, upcoming appointments, etc.  Non-urgent messages can be sent to your provider as well.   To learn more about what you can do with MyChart, go to [email or web address].    Your next appointment:   12 month(s)  The format for your next appointment:   In Person  Provider:   Donato Schultz, MD   Thank you for choosing [facility]!!        Signed, Oscar Schultz, MD  12/01/2020 3:53 PM    [facility]

## 2020-12-02 ENCOUNTER — Telehealth: Payer: Self-pay | Admitting: Cardiology

## 2020-12-02 NOTE — Telephone Encounter (Signed)
LDL cholesterol 188. This is on atorvastatin 40 mg a day  Triglycerides are much improved on fenofibrate 145 and fish oil 2 g twice daily.  Lets go ahead and switch his atorvastatin over to Crestor 40 mg a day to see if we can help lower his LDL further.  Repeat lipid panel and ALT in 3 months.  Donato Schultz, MD

## 2020-12-03 MED ORDER — ROSUVASTATIN CALCIUM 40 MG PO TABS
40.0000 mg | ORAL_TABLET | Freq: Every day | ORAL | 3 refills | Status: DC
Start: 2020-12-03 — End: 2022-08-16

## 2020-12-03 NOTE — Addendum Note (Signed)
Addended by: Sharin Grave on: 12/03/2020 01:29 PM   Modules accepted: Orders

## 2020-12-03 NOTE — Telephone Encounter (Signed)
Pt aware to discontinue his atorvastatin and start Crestor 40 mg a day.  Rx sent into [facility] as requested by pt.  He is aware to have lab work re-checked mid April 2022.  He wants to have this done at his PCP office so he doesn't have to take off work to come in here.  He will c/b if any questions or concerns.

## 2021-01-07 ENCOUNTER — Telehealth: Payer: Self-pay | Admitting: Endocrinology

## 2021-01-07 NOTE — Telephone Encounter (Signed)
Patient's wife Herbert Seta requests to be called at ph# [phone] re: Patient's DOT Physical requirement

## 2021-01-07 NOTE — Telephone Encounter (Signed)
Pt wife stated already had DOT form filled with pt PCP--a1c 6.5

## 2021-02-09 ENCOUNTER — Ambulatory Visit: Payer: No Typology Code available for payment source | Admitting: Endocrinology

## 2021-03-03 ENCOUNTER — Ambulatory Visit: Payer: No Typology Code available for payment source | Admitting: Endocrinology

## 2022-08-14 ENCOUNTER — Other Ambulatory Visit: Payer: Self-pay

## 2022-08-14 ENCOUNTER — Other Ambulatory Visit (INDEPENDENT_AMBULATORY_CARE_PROVIDER_SITE_OTHER)
Admission: EM | Admit: 2022-08-14 | Discharge: 2022-08-17 | Disposition: A | Discharge status: Home / Self Care | Payer: No Typology Code available for payment source | Source: Home / Self Care | Admitting: Psychiatry

## 2022-08-14 ENCOUNTER — Encounter (HOSPITAL_COMMUNITY): Payer: Self-pay

## 2022-08-14 ENCOUNTER — Emergency Department (HOSPITAL_COMMUNITY)
Admission: EM | Admit: 2022-08-14 | Discharge: 2022-08-14 | Disposition: A | Discharge status: Home / Self Care | Payer: No Typology Code available for payment source | Attending: Emergency Medicine | Admitting: Emergency Medicine

## 2022-08-14 DIAGNOSIS — F1010 Alcohol abuse, uncomplicated: Secondary | ICD-10-CM | POA: Insufficient documentation

## 2022-08-14 DIAGNOSIS — E1169 Type 2 diabetes mellitus with other specified complication: Secondary | ICD-10-CM | POA: Diagnosis present

## 2022-08-14 DIAGNOSIS — E785 Hyperlipidemia, unspecified: Secondary | ICD-10-CM | POA: Insufficient documentation

## 2022-08-14 DIAGNOSIS — F411 Generalized anxiety disorder: Secondary | ICD-10-CM | POA: Insufficient documentation

## 2022-08-14 DIAGNOSIS — E119 Type 2 diabetes mellitus without complications: Secondary | ICD-10-CM

## 2022-08-14 DIAGNOSIS — I10 Essential (primary) hypertension: Secondary | ICD-10-CM | POA: Insufficient documentation

## 2022-08-14 DIAGNOSIS — Z20822 Contact with and (suspected) exposure to covid-19: Secondary | ICD-10-CM | POA: Insufficient documentation

## 2022-08-14 DIAGNOSIS — F32A Depression, unspecified: Secondary | ICD-10-CM | POA: Insufficient documentation

## 2022-08-14 DIAGNOSIS — F17200 Nicotine dependence, unspecified, uncomplicated: Secondary | ICD-10-CM | POA: Diagnosis present

## 2022-08-14 LAB — POCT URINE DRUG SCREEN - MANUAL ENTRY (I-SCREEN)
POC Amphetamine UR: NOT DETECTED
POC Buprenorphine (BUP): NOT DETECTED
POC Cocaine UR: NOT DETECTED
POC Marijuana UR: NOT DETECTED
POC Methadone UR: NOT DETECTED
POC Methamphetamine UR: NOT DETECTED
POC Morphine: NOT DETECTED
POC Oxazepam (BZO): NOT DETECTED
POC Oxycodone UR: NOT DETECTED
POC Secobarbital (BAR): NOT DETECTED

## 2022-08-14 LAB — RESP PANEL BY RT-PCR (FLU A&B, COVID) ARPGX2
Influenza A by PCR: NEGATIVE
Influenza B by PCR: NEGATIVE
SARS Coronavirus 2 by RT PCR: NEGATIVE

## 2022-08-14 LAB — COMPREHENSIVE METABOLIC PANEL
ALT: 35 U/L (ref 0–44)
AST: 51 U/L — ABNORMAL HIGH (ref 15–41)
Albumin: 4.7 g/dL (ref 3.5–5.0)
Alkaline Phosphatase: 37 U/L — ABNORMAL LOW (ref 38–126)
Anion gap: 10 (ref 5–15)
BUN: 12 mg/dL (ref 6–20)
CO2: 23 mmol/L (ref 22–32)
Calcium: 9.8 mg/dL (ref 8.9–10.3)
Chloride: 100 mmol/L (ref 98–111)
Creatinine, Ser: 1.18 mg/dL (ref 0.61–1.24)
GFR, Estimated: >60 mL/min (ref >60)
Glucose, Bld: 111 mg/dL — ABNORMAL HIGH (ref 70–99)
Potassium: 4 mmol/L (ref 3.5–5.1)
Sodium: 133 mmol/L — ABNORMAL LOW (ref 135–145)
Total Bilirubin: 0.9 mg/dL (ref 0.3–1.2)
Total Protein: 7.8 g/dL (ref 6.5–8.1)

## 2022-08-14 LAB — GLUCOSE, CAPILLARY
Glucose-Capillary: 65 mg/dL — ABNORMAL LOW (ref 70–99)
Glucose-Capillary: 175 mg/dL — ABNORMAL HIGH (ref 70–99)

## 2022-08-14 LAB — CBC
HCT: 49 % (ref 39.0–52.0)
Hemoglobin: 17.7 g/dL — ABNORMAL HIGH (ref 13.0–17.0)
MCH: 32 pg (ref 26.0–34.0)
MCHC: 36.1 g/dL — ABNORMAL HIGH (ref 30.0–36.0)
MCV: 88.6 fL (ref 80.0–100.0)
Platelets: 176 10*3/uL (ref 150–400)
RBC: 5.53 MIL/uL (ref 4.22–5.81)
RDW: 11.8 % (ref 11.5–15.5)
WBC: 9.4 10*3/uL (ref 4.0–10.5)
nRBC: 0 % (ref 0.0–0.2)

## 2022-08-14 LAB — RAPID URINE DRUG SCREEN, HOSP PERFORMED
Amphetamines: NOT DETECTED
Barbiturates: NOT DETECTED
Benzodiazepines: NOT DETECTED
Cocaine: NOT DETECTED
Opiates: NOT DETECTED
Tetrahydrocannabinol: NOT DETECTED

## 2022-08-14 LAB — HEMOGLOBIN A1C
Hgb A1c MFr Bld: 6.7 % — ABNORMAL HIGH (ref 4.8–5.6)
Mean Plasma Glucose: 145.59 mg/dL

## 2022-08-14 LAB — POC SARS CORONAVIRUS 2 AG: SARSCOV2ONAVIRUS 2 AG: NEGATIVE

## 2022-08-14 MED ORDER — TRAZODONE HCL 50 MG PO TABS: 50.0000 mg | ORAL_TABLET | Freq: Every evening | ORAL | Status: DC | PRN

## 2022-08-14 MED ORDER — PAROXETINE HCL 20 MG PO TABS
60.0000 mg | ORAL_TABLET | Freq: Every day | ORAL | Status: DC
  Administered 2022-08-15: 60 mg via ORAL
  Filled 2022-08-14: qty 3

## 2022-08-14 MED ORDER — LORAZEPAM 1 MG PO TABS: 1.0000 mg | ORAL_TABLET | Freq: Every day | ORAL | Status: DC

## 2022-08-14 MED ORDER — ONDANSETRON 4 MG PO TBDP: 4.0000 mg | ORAL_TABLET | Freq: Four times a day (QID) | ORAL | Status: DC | PRN

## 2022-08-14 MED ORDER — ALUM & MAG HYDROXIDE-SIMETH 200-200-20 MG/5ML PO SUSP: 30.0000 mL | ORAL | Status: DC | PRN

## 2022-08-14 MED ORDER — LOPERAMIDE HCL 2 MG PO CAPS: 2.0000 mg | ORAL_CAPSULE | ORAL | Status: DC | PRN

## 2022-08-14 MED ORDER — ACETAMINOPHEN 325 MG PO TABS: 650.0000 mg | ORAL_TABLET | Freq: Four times a day (QID) | ORAL | Status: DC | PRN

## 2022-08-14 MED ORDER — INSULIN GLARGINE 100 UNITS/ML SOLOSTAR PEN
11.0000 [IU] | PEN_INJECTOR | SUBCUTANEOUS | Status: DC
  Filled 2022-08-14: qty 3

## 2022-08-14 MED ORDER — INSULIN ASPART 100 UNIT/ML IJ SOLN: 0.0000 [IU] | Freq: Every day | INTRAMUSCULAR | Status: DC

## 2022-08-14 MED ORDER — CHLORDIAZEPOXIDE HCL 25 MG PO CAPS: ORAL_CAPSULE | ORAL | 0 refills | Status: DC

## 2022-08-14 MED ORDER — THIAMINE HCL 100 MG/ML IJ SOLN
100.0000 mg | Freq: Once | INTRAMUSCULAR | Status: AC
  Administered 2022-08-14: 100 mg via INTRAMUSCULAR
  Filled 2022-08-14: qty 2

## 2022-08-14 MED ORDER — LORAZEPAM 1 MG PO TABS
1.0000 mg | ORAL_TABLET | Freq: Four times a day (QID) | ORAL | Status: AC
  Administered 2022-08-14 – 2022-08-15 (×6): 1 mg via ORAL
  Filled 2022-08-14 (×6): qty 1

## 2022-08-14 MED ORDER — ADULT MULTIVITAMIN W/MINERALS CH
1.0000 | ORAL_TABLET | Freq: Every day | ORAL | Status: DC
  Administered 2022-08-14 – 2022-08-17 (×4): 1 via ORAL
  Filled 2022-08-14 (×4): qty 1

## 2022-08-14 MED ORDER — HYDRALAZINE HCL 10 MG PO TABS
10.0000 mg | ORAL_TABLET | Freq: Two times a day (BID) | ORAL | Status: DC
  Administered 2022-08-15 – 2022-08-17 (×5): 10 mg via ORAL
  Filled 2022-08-14 (×5): qty 1

## 2022-08-14 MED ORDER — HYDROXYZINE HCL 25 MG PO TABS: 25.0000 mg | ORAL_TABLET | Freq: Three times a day (TID) | ORAL | Status: DC | PRN

## 2022-08-14 MED ORDER — HYDROXYZINE HCL 25 MG PO TABS: 25.0000 mg | ORAL_TABLET | Freq: Four times a day (QID) | ORAL | Status: DC | PRN

## 2022-08-14 MED ORDER — METOPROLOL SUCCINATE ER 50 MG PO TB24
100.0000 mg | ORAL_TABLET | Freq: Every day | ORAL | Status: DC
  Administered 2022-08-15: 100 mg via ORAL
  Filled 2022-08-14: qty 2

## 2022-08-14 MED ORDER — BUSPIRONE HCL 15 MG PO TABS
30.0000 mg | ORAL_TABLET | Freq: Two times a day (BID) | ORAL | Status: DC
  Administered 2022-08-14 – 2022-08-17 (×6): 30 mg via ORAL
  Filled 2022-08-14 (×6): qty 2

## 2022-08-14 MED ORDER — HYDRALAZINE HCL 10 MG PO TABS
20.0000 mg | ORAL_TABLET | Freq: Every day | ORAL | Status: DC
  Administered 2022-08-14 – 2022-08-16 (×3): 20 mg via ORAL
  Filled 2022-08-14 (×4): qty 2

## 2022-08-14 MED ORDER — DESMOPRESSIN ACETATE 0.1 MG PO TABS
200.0000 ug | ORAL_TABLET | Freq: Every day | ORAL | Status: DC
  Administered 2022-08-14 – 2022-08-16 (×3): 200 ug via ORAL
  Filled 2022-08-14 (×3): qty 2

## 2022-08-14 MED ORDER — LORAZEPAM 1 MG PO TABS
1.0000 mg | ORAL_TABLET | Freq: Three times a day (TID) | ORAL | Status: DC
  Administered 2022-08-16 (×2): 1 mg via ORAL
  Filled 2022-08-14 (×2): qty 1

## 2022-08-14 MED ORDER — ROSUVASTATIN CALCIUM 20 MG PO TABS
40.0000 mg | ORAL_TABLET | Freq: Every day | ORAL | Status: DC
  Administered 2022-08-15 – 2022-08-17 (×3): 40 mg via ORAL
  Filled 2022-08-14 (×3): qty 2

## 2022-08-14 MED ORDER — THIAMINE MONONITRATE 100 MG PO TABS
100.0000 mg | ORAL_TABLET | Freq: Every day | ORAL | Status: DC
  Administered 2022-08-15 – 2022-08-17 (×3): 100 mg via ORAL
  Filled 2022-08-14 (×3): qty 1

## 2022-08-14 MED ORDER — LORAZEPAM 1 MG PO TABS: 1.0000 mg | ORAL_TABLET | Freq: Two times a day (BID) | ORAL | Status: DC

## 2022-08-14 MED ORDER — FENOFIBRATE 160 MG PO TABS
160.0000 mg | ORAL_TABLET | Freq: Every day | ORAL | Status: DC
  Administered 2022-08-15 – 2022-08-17 (×3): 160 mg via ORAL
  Filled 2022-08-14 (×3): qty 1

## 2022-08-14 MED ORDER — MAGNESIUM HYDROXIDE 400 MG/5ML PO SUSP: 30.0000 mL | Freq: Every day | ORAL | Status: DC | PRN

## 2022-08-14 MED ORDER — INSULIN ASPART 100 UNIT/ML IJ SOLN
0.0000 [IU] | Freq: Three times a day (TID) | INTRAMUSCULAR | Status: DC
  Administered 2022-08-15: 1 [IU] via SUBCUTANEOUS
  Administered 2022-08-15 – 2022-08-16 (×2): 3 [IU] via SUBCUTANEOUS
  Administered 2022-08-17: 2 [IU] via SUBCUTANEOUS

## 2022-08-14 MED ORDER — PAROXETINE HCL 20 MG PO TABS: 20.0000 mg | ORAL_TABLET | Freq: Every day | ORAL | Status: DC

## 2022-08-14 MED ORDER — NICOTINE 21 MG/24HR TD PT24
21.0000 mg | MEDICATED_PATCH | Freq: Every day | TRANSDERMAL | Status: DC
  Administered 2022-08-14 – 2022-08-16 (×3): 21 mg via TRANSDERMAL
  Filled 2022-08-14 (×3): qty 1

## 2022-08-14 MED ORDER — LORAZEPAM 1 MG PO TABS: 1.0000 mg | ORAL_TABLET | Freq: Four times a day (QID) | ORAL | Status: DC | PRN

## 2022-08-14 NOTE — BH Assessment (Signed)
Comprehensive Clinical Assessment (CCA) Note  08/14/2022 Oscar Ruiz [id number] DISPOSITION: [name] recommends patient to be transferred to [facility] to assist with ongoing alcohol issues.   Flowsheet Row ED from 08/14/2022 in [facility] Most recent reading at 08/14/2022  6:39 PM ED from 08/14/2022 in [facility] Most recent reading at 08/14/2022 11:43 AM  C-SSRS RISK CATEGORY No Risk No Risk      The patient demonstrates the following risk factors for suicide: Chronic risk factors for suicide include: N/A. Acute risk factors for suicide include: N/A. Protective factors for this patient include: coping skills. Considering these factors, the overall suicide risk at this point appears to be low. Patient is appropriate for outpatient follow up.   Patient is a 46 year old male that presents this date voluntary to [facility] requesting assistance with ongoing alcohol issues. Patient denies any S/I, H/I or AVH. Patient denies any prior attempts or gestures or self harm. Patient denies any prior inpatient admissions associated with mental health. Patient reports he was diagnosed with GAD and receives medication management from his PCP. See MAR in reference to current medications. Patient denies any history of OP counseling. Patient reports daily alcohol use for the last 4 years stating he had been consuming 12 to 18 -12 oz beers daily although has reduced his intake over the last 2 weeks reporting he has been drinking 8 to 10 12 oz beers daily with last use last night 9/22 when he reported having "a few beers." Patient denies any other SA issues although has a history of opiate use that is currently in remission.     Oscar Ruiz evaluated patient this date and writes: Oscar Ruiz is a 46 year old Caucasian male that presents to [facility] urgent care accompanied by his wife seeking detox treatment.  States he drinks 12-18 beers daily.  States  due to his declining health he would like to get his alcohol abuse under wraps.  Reports previous inpatient facility for substance abuse treatment with opiates and alcohol.  Currently he is denying suicidal or homicidal ideations.  Denies auditory visual hallucinations.  Reports mild frustration related to his waiting in the emergency department without being treated.  Chart review patient was discharged on a Librium taper with additional outpatient resources.  Patient was initially reluctant to stay for treatment.  States his main concerns as hypertension, anxiety and some depression.   He denied history of delirium tremors and or withdrawal seizures.  Denying any other illicit drug use currently.  States his last drink was 24 hours ago. "  If I leave here I will just go drink."   During evaluation Oscar Ruiz is standing in no acute distress.  he is alert/oriented x 4; calm/cooperative; and mood congruent with affect. he is speaking in a clear tone at moderate volume, and normal pace; with good eye contact. His thought process is coherent and relevant; There is no indication that he is currently responding to internal/external stimuli or experiencing delusional thought content; and he has denied suicidal/self-harm/homicidal ideation, psychosis, and paranoia. Patient has remained calm throughout assessment and has answered questions appropriately.     Chief Complaint:  Chief Complaint  Patient presents with   Alcohol Problem   Visit Diagnosis: Alcohol abuse    CCA Screening, Triage and Referral (STR)  Patient Reported Information How did you hear about [place]? Self  What Is the Reason for Your Visit/Call Today? Pt presents with ongoing alcohol issues and  is requesting assistance with detox. Pt denies any S/I, H/I or AVH  How Long Has This Been Causing You Problems? > than 6 months  What Do You Feel Would Help You the Most Today? Alcohol or Drug Use Treatment   Have You Recently Had Any  Thoughts About Hurting Yourself? No  Are You Planning to Commit Suicide/Harm Yourself At This time? No   Have you Recently Had Thoughts About Hurting Someone Karolee Ohs? No  Are You Planning to Harm Someone at This Time? No  Explanation: No data recorded  Have You Used Any Alcohol or Drugs in the Past 24 Hours? Yes  How Long Ago Did You Use Drugs or Alcohol? No data recorded What Did You Use and How Much? Pt reports last night 9/22 when he reported having 4 12 oz beers   Do You Currently Have a Therapist/Psychiatrist? No  Name of Therapist/Psychiatrist: No data recorded  Have You Been Recently Discharged From Any Office Practice or Programs? No  Explanation of Discharge From Practice/Program: No data recorded    CCA Screening Triage Referral Assessment Type of Contact: Face-to-Face  Telemedicine Service Delivery:   Is this Initial or Reassessment? No data recorded Date Telepsych consult ordered in [facility]:  No data recorded Time Telepsych consult ordered in [facility]:  No data recorded Location of Assessment: [facility] Assessment Services  Provider Location: [facility] Assessment Services   Collateral Involvement: None at this time   Does Patient Have a Automotive engineer Guardian? No  Legal Guardian Contact Information: No data recorded Copy of Legal Guardianship Form: No data recorded Legal Guardian Notified of Arrival: No data recorded Legal Guardian Notified of Pending Discharge: No data recorded If Minor and Not Living with Parent(s), Who has Custody? NA  Is CPS involved or ever been involved? Never  Is APS involved or ever been involved? Never   Patient Determined To Be At Risk for Harm To Self or Others Based on Review of Patient Reported Information or Presenting Complaint? No  Method: No data recorded Availability of Means: No data recorded Intent: No data recorded Notification Required: No data recorded Additional Information for Danger to Others Potential: No data  recorded Additional Comments for Danger to Others Potential: No data recorded Are There Guns or Other Weapons in Your Home? No data recorded Types of Guns/Weapons: No data recorded Are These Weapons Safely Secured?                            No data recorded Who Could Verify You Are Able To Have These Secured: No data recorded Do You Have any Outstanding Charges, Pending Court Dates, Parole/Probation? No data recorded Contacted To Inform of Risk of Harm To Self or Others: Other: Comment (NA)    Does Patient Present under Involuntary Commitment? No  IVC Papers Initial File Date: No data recorded  [place] of Residence: [place]   Patient Currently Receiving the Following Services: Medication Management (From his PCP)   Determination of Need: Urgent (48 hours)   Options For Referral: Facility-Based Crisis     CCA Biopsychosocial Patient Reported Schizophrenia/Schizoaffective Diagnosis in Past: No   Strengths: Pt is willing to participate in treatment   Mental Health Symptoms Depression:   Change in energy/activity; Irritability   Duration of Depressive symptoms:  Duration of Depressive Symptoms: Greater than two weeks   Mania:   None   Anxiety:    Difficulty concentrating; Irritability; Restlessness   Psychosis:  None   Duration of Psychotic symptoms:    Trauma:   None   Obsessions:   None   Compulsions:   None   Inattention:   None   Hyperactivity/Impulsivity:   None   Oppositional/Defiant Behaviors:   None   Emotional Irregularity:   None   Other Mood/Personality Symptoms:   NA    Mental Status Exam Appearance and self-care  Stature:   Average   Weight:   Average weight   Clothing:   Casual   Grooming:   Normal   Cosmetic use:   None   Posture/gait:   Normal   Motor activity:   Not Remarkable   Sensorium  Attention:   Normal   Concentration:   Normal   Orientation:   X5   Recall/memory:   Normal   Affect  and Mood  Affect:   Anxious   Mood:   Anxious   Relating  Eye contact:   Normal   Facial expression:   Anxious   Attitude toward examiner:   Cooperative   Thought and Language  Speech flow:  Clear and Coherent   Thought content:   Appropriate to Mood and Circumstances   Preoccupation:   None   Hallucinations:   None   Organization:  No data recorded  [employer] of Knowledge:   Fair   Intelligence:   Average   Abstraction:   Normal   Judgement:   Fair   Programmer, systems   Insight:   Fair   Decision Making:   Normal   Social Functioning  Social Maturity:   Responsible   Social Judgement:   Normal   Stress  Stressors:   Family conflict   Coping Ability:   Normal   Skill Deficits:   Activities of daily living   Supports:   Family     Religion: Religion/Spirituality Are You A Religious Person?: No How Might This Affect Treatment?: NA  Leisure/Recreation: Leisure / Recreation Do You Have Hobbies?: No  Exercise/Diet: Exercise/Diet Do You Exercise?: No Have You Gained or Lost A Significant Amount of Weight in the Past Six Months?: No Do You Follow a Special Diet?: No Do You Have Any Trouble Sleeping?: No   CCA Employment/Education Employment/Work Situation: Employment / Work Situation Employment Situation: Unemployed Patient's Job has Been Impacted by Current Illness: No  Education: Education Is Patient Currently Attending School?: No Last Grade Completed: 12 Did You Product manager?: No Did You Have An Individualized Education Program (IIEP): No Did You Have Any Difficulty At [employer]?: No Patient's Education Has Been Impacted by Current Illness: No   CCA Family/Childhood History Family and Relationship History: Family history Marital status: Married Number of Years Married: 12 What types of issues is patient dealing with in the relationship?: Pt states his ongoing alcohol issues have  been an issue Additional relationship information: NA Does patient have children?: No  Childhood History:  Childhood History By whom was/is the patient raised?: Both parents Did patient suffer any verbal/emotional/physical/sexual abuse as a child?: No Did patient suffer from severe childhood neglect?: No Has patient ever been sexually abused/assaulted/raped as an adolescent or adult?: No Was the patient ever a victim of a crime or a disaster?: No Witnessed domestic violence?: No Has patient been affected by domestic violence as an adult?: No  Child/Adolescent Assessment:     CCA Substance Use Alcohol/Drug Use: Alcohol / Drug Use Pain Medications: See MAR Prescriptions: See MAR Over the Counter: See  MAR History of alcohol / drug use?: Yes Longest period of sobriety (when/how long): 6 months Negative Consequences of Use: Personal relationships Withdrawal Symptoms: Agitation, Sweats, Weakness Substance #1 Name of Substance 1: Alcohol 1 - Age of First Use: 18 1 - Amount (size/oz): Pt reports he had been drinking up to 24 12 oz beers daily until the last week when he has cut back to 8 to 10 beers 1 - Frequency: Daily 1 - Duration: Last year 1 - Last Use / Amount: Last night 9/22 when he reported he had "a few beers" 1 - Method of Aquiring: NA 1- Route of Use: Oral                       ASAM's:  Six Dimensions of Multidimensional Assessment  Dimension 1:  Acute Intoxication and/or Withdrawal Potential:   Dimension 1:  Description of individual's past and current experiences of substance use and withdrawal: Intoxication is severe with moderate withdrawals  Dimension 2:  Biomedical Conditions and Complications:   Dimension 2:  Description of patient's biomedical conditions and  complications: Pt has some hyper tension and elevated CBGs he has been addressing with his PCP  Dimension 3:  Emotional, Behavioral, or Cognitive Conditions and Complications:  Dimension 3:   Description of emotional, behavioral, or cognitive conditions and complications: pt has difficultly maintaining his sobriety  Dimension 4:  Readiness to Change:  Dimension 4:  Description of Readiness to Change criteria: Willing to enter treatment  Dimension 5:  Relapse, Continued use, or Continued Problem Potential:  Dimension 5:  Relapse, continued use, or continued problem potential critiera description: Impaired recognition  Dimension 6:  Recovery/Living Environment:  Dimension 6:  Recovery/Iiving environment criteria description: Environemnt is supportive to change  ASAM Severity Score: ASAM's Severity Rating Score: 9  ASAM Recommended Level of Treatment:     Substance use Disorder (SUD) Substance Use Disorder (SUD)  Checklist Symptoms of Substance Use: Continued use despite having a persistent/recurrent physical/psychological problem caused/exacerbated by use  Recommendations for Services/Supports/Treatments: Recommendations for Services/Supports/Treatments Recommendations For Services/Supports/Treatments: Facility Based Crisis  Discharge Disposition:    DSM5 Diagnoses: Patient Active Problem List   Diagnosis Date Noted   Alcohol abuse 08/14/2022   Diabetes ([place]) 11/02/2019   Sinus tachycardia 07/30/2014   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/21/2013   Generalized anxiety disorder 04/27/2013   GOUT, UNSPECIFIED 01/06/2011   HYPERLIPIDEMIA 05/07/2009   PREDIABETES 05/07/2009   LIVER FUNCTION TESTS, ABNORMAL 05/31/2008   PANIC ATTACK 05/20/2008   TOBACCO ABUSE 05/20/2008   Essential hypertension, benign 05/20/2008   LOW BACK PAIN, CHRONIC 01/02/2008     Referrals to Alternative Service(s): Referred to Alternative Service(s):   Place:   Date:   Time:    Referred to Alternative Service(s):   Place:   Date:   Time:    Referred to Alternative Service(s):   Place:   Date:    Time:    Referred to Alternative Service(s):   Place:   Date:   Time:     Mamie Nick, LCAS

## 2022-08-14 NOTE — ED Notes (Signed)
Pt agitated at time of discharge. Attempted to encourage pt to get the prescription and then go to [facility] as was stated in his discharge instructions

## 2022-08-14 NOTE — ED Provider Notes (Signed)
Facility Based Crisis Admission H&P  Date: 08/14/22 Patient Name: Oscar Ruiz MRN: [id number] Chief Complaint:  Chief Complaint  Patient presents with   Alcohol Problem      Diagnoses:  Final diagnoses:  Alcohol abuse    HPI: Oscar Ruiz is a 46 year old Caucasian male that presents to [facility] urgent care accompanied by his wife seeking detox treatment.  States he drinks 12-18 beers daily.  States due to his declining health he would like to get his alcohol abuse under wraps.  Reports previous inpatient facility for substance abuse treatment with opiates and alcohol.  Currently he is denying suicidal or homicidal ideations.  Denies auditory visual hallucinations.  Reports mild frustration related to his waiting in the emergency department without being treated.  Chart review patient was discharged on a Librium taper with additional outpatient resources.  Patient was initially reluctant to stay for treatment.  States his main concerns as hypertension, anxiety and some depression.  He denied history of delirium tremors and or withdrawal seizures.  Denying any other illicit drug use currently.  States his last drink was 24 hours ago. "  If I leave here I will just go drink."  During evaluation Oscar Ruiz is standing in no acute distress.  he is alert/oriented x 4; calm/cooperative; and mood congruent with affect. he is speaking in a clear tone at moderate volume, and normal pace; with good eye contact. His thought process is coherent and relevant; There is no indication that he is currently responding to internal/external stimuli or experiencing delusional thought content; andhe has denied suicidal/self-harm/homicidal ideation, psychosis, and paranoia. Patient has remained calm throughout assessment and has answered questions appropriately.        PHQ 2-9:   Flowsheet Row ED from 08/14/2022 in [facility] Most recent reading at 08/14/2022   5:16 PM ED from 08/14/2022 in [facility] Most recent reading at 08/14/2022 11:43 AM  C-SSRS RISK CATEGORY No Risk No Risk        Total Time spent with patient: 15 minutes  Musculoskeletal  Strength & Muscle Tone: within normal limits Gait & Station: normal Patient leans: N/A  Psychiatric Specialty Exam  Presentation General Appearance: Appropriate for Environment Eye Contact:Good Speech:Clear and Coherent Speech Volume:Decreased Handedness:Right  Mood and Affect  Mood:Anxious; Depressed Affect:Congruent  Thought Process  Thought Processes:Coherent Descriptions of Associations:Intact  Orientation:Full (Time, Place and Person)  Thought Content:Logical    Hallucinations:Hallucinations: None  Ideas of Reference:None  Suicidal Thoughts:Suicidal Thoughts: No  Homicidal Thoughts:Homicidal Thoughts: No   Sensorium  Memory:Immediate Good; Recent Good; Remote Fair Judgment:Fair Insight:Fair  Executive Functions  Concentration:Fair Attention Span:Good Recall:Good Fund of Knowledge:Good Language:Good  Psychomotor Activity  Psychomotor Activity:Psychomotor Activity: Normal  Assets  Assets:Communication Skills; Intimacy; Social Support  Sleep  Sleep:Sleep: Fair  Nutritional Assessment (For OBS and FBC admissions only) Has the patient had a weight loss or gain of 10 pounds or more in the last 3 months?: No Has the patient had a decrease in food intake/or appetite?: No Does the patient have dental problems?: No Does the patient have eating habits or behaviors that may be indicators of an eating disorder including binging or inducing vomiting?: No Has the patient recently lost weight without trying?: 0 Has the patient been eating poorly because of a decreased appetite?: 0 Malnutrition Screening Tool Score: 0    Physical Exam ROS  Blood pressure (!) 170/87, pulse 73, temperature 98.2 F (36.8 C), temperature source Oral,  resp. rate 18, SpO2 100 %. There is no height or weight on file to calculate BMI.  Past Psychiatric History: Reported history with anxiety/panic attacks alcohol dependency and opiate misuse disorder.  Is the patient at risk to self? No  Has the patient been a risk to self in the past 6 months? No .    Has the patient been a risk to self within the distant past? No   Is the patient a risk to others? No   Has the patient been a risk to others in the past 6 months? No   Has the patient been a risk to others within the distant past? No   Past Medical History:  Past Medical History:  Diagnosis Date   Anxiety    Diabetes mellitus without complication (HCC)    Hypertension    Seizures (HCC)    Substance abuse (HCC)    tobacco and alcohol    Past Surgical History:  Procedure Laterality Date   HERNIA REPAIR      Family History:  Family History  Problem Relation Age of Onset   Diabetes Mother    Alcohol abuse Father    Hyperlipidemia Father    Coronary artery disease Father    Heart failure Father     Social History:  Social History   Socioeconomic History   Marital status: Married    Spouse name: Not on file   Number of children: 2   Years of education: Not on file   Highest education level: Not on file  Occupational History   Occupation: Artist: SELF  Tobacco Use   Smoking status: Every Day    Packs/day: 2.00    Types: Cigarettes   Smokeless tobacco: Former  Building services engineer Use: Never used  Substance and Sexual Activity   Alcohol use: Yes    Alcohol/week: 36.0 standard drinks of alcohol    Types: 36 Cans of beer per week    Comment: was in remission for 2 years in rehab 2017   Drug use: No   Sexual activity: Yes    Birth control/protection: None  Other Topics Concern   Not on file  Social History Narrative   Regular exercise-yes   Diet: Fast food, skips meals   Social Determinants of Health   Financial Resource Strain: Not on file   Food Insecurity: Not on file  Transportation Needs: Not on file  Physical Activity: Not on file  Stress: Not on file  Social Connections: Not on file  Intimate Partner Violence: Not on file    SDOH:  SDOH Screenings   Tobacco Use: High Risk (08/14/2022)    Last Labs:  Admission on 08/14/2022, Discharged on 08/14/2022  Component Date Value Ref Range Status   Sodium 08/14/2022 133 (L)  135 - 145 mmol/L Final   Potassium 08/14/2022 4.0  3.5 - 5.1 mmol/L Final   Chloride 08/14/2022 100  98 - 111 mmol/L Final   CO2 08/14/2022 23  22 - 32 mmol/L Final   Glucose, Bld 08/14/2022 111 (H)  70 - 99 mg/dL Final   Glucose reference range applies only to samples taken after fasting for at least 8 hours.   BUN 08/14/2022 12  6 - 20 mg/dL Final   Creatinine, Ser 08/14/2022 1.18  0.61 - 1.24 mg/dL Final   Calcium 54/62/7035 9.8  8.9 - 10.3 mg/dL Final   Total Protein 00/93/8182 7.8  6.5 - 8.1 g/dL Final   Albumin 99/37/1696  4.7  3.5 - 5.0 g/dL Final   AST 08/14/2022 51 (H)  15 - 41 U/L Final   ALT 08/14/2022 35  0 - 44 U/L Final   Alkaline Phosphatase 08/14/2022 37 (L)  38 - 126 U/L Final   Total Bilirubin 08/14/2022 0.9  0.3 - 1.2 mg/dL Final   GFR, Estimated 08/14/2022 >60  >60 mL/min Final   Comment: (NOTE) Calculated using the CKD-EPI Creatinine Equation (2021)    Anion gap 08/14/2022 10  5 - 15 Final   Performed at [facility], [place] [address], [place], [place] [address]   WBC 08/14/2022 9.4  4.0 - 10.5 K/uL Final   RBC 08/14/2022 5.53  4.22 - 5.81 MIL/uL Final   Hemoglobin 08/14/2022 17.7 (H)  13.0 - 17.0 g/dL Final   HCT 08/14/2022 49.0  39.0 - 52.0 % Final   MCV 08/14/2022 88.6  80.0 - 100.0 fL Final   MCH 08/14/2022 32.0  26.0 - 34.0 pg Final   MCHC 08/14/2022 36.1 (H)  30.0 - 36.0 g/dL Final   RDW 08/14/2022 11.8  11.5 - 15.5 % Final   Platelets 08/14/2022 176  150 - 400 K/uL Final   nRBC 08/14/2022 0.0  0.0 - 0.2 % Final   Performed at [facility], [place] [address] [place], [place] [address]   Opiates 08/14/2022 NONE DETECTED  NONE DETECTED Final   Cocaine 08/14/2022 NONE DETECTED  NONE DETECTED Final   Benzodiazepines 08/14/2022 NONE DETECTED  NONE DETECTED Final   Amphetamines 08/14/2022 NONE DETECTED  NONE DETECTED Final   Tetrahydrocannabinol 08/14/2022 NONE DETECTED  NONE DETECTED Final   Barbiturates 08/14/2022 NONE DETECTED  NONE DETECTED Final   Comment: (NOTE) DRUG SCREEN FOR MEDICAL PURPOSES ONLY.  IF CONFIRMATION IS NEEDED FOR ANY PURPOSE, NOTIFY LAB WITHIN 5 DAYS.  LOWEST DETECTABLE LIMITS FOR URINE DRUG SCREEN Drug Class                     Cutoff (ng/mL) Amphetamine and metabolites    1000 Barbiturate and metabolites    200 Benzodiazepine                 809 Tricyclics and metabolites     300 Opiates and metabolites        300 Cocaine and metabolites        300 THC                            50 Performed at [facility], [place] [address] [place], [place] [address]     Allergies: Bupropion and Gabapentin  PTA Medications: (Not in a hospital admission)   Long Term Goals: Improvement in symptoms so as ready for discharge  Short Term Goals: Patient will verbalize feelings in meetings with treatment team members., Patient will attend at least of 50% of the groups daily., and Pt will complete the PHQ9 on admission, day 3 and discharge.  Medical Decision Making  Facility based crisis [facility]) Initiated Ativan detox protocol -We will restart home medications where appropriate     Recommendations  Based on my evaluation the patient does not appear to have an emergency medical condition.  Derrill Center, NP 08/14/22  5:46 PM

## 2022-08-14 NOTE — ED Provider Notes (Addendum)
[facility] DEPT Provider Note   CSN: [id number] Arrival date & time: 08/14/22  1105     History  Chief Complaint  Patient presents with   Alcohol Problem    Oscar Ruiz is a 46 y.o. male.  Patient with history of alcohol abuse presents today requesting alcohol detox. He states that for the past 4 years he has been drinking 12-18 beers per day. Last drink was last night. States that he went to [facility] in 2014 and went through medication assisted detox at that time. States that he presents today for same. Denies any history of Dts or seizures from alcohol withdrawal. States that he doesn't currently feel like he is withdrawing from alcohol. He denies headache, shortness of breath, chest pain, nausea, vomiting, diarrhea, or abdominal pain. Denies any other substance use. Denies SI/HI or AVH.  The history is provided by the patient. No language interpreter was used.  Alcohol Problem       Home Medications Prior to Admission medications   Medication Sig Start Date End Date Taking? Authorizing Provider  busPIRone (BUSPAR) 30 MG tablet Take 1 tablet (30 mg total) by mouth 2 (two) times daily. 12/20/19   Donnal Moat T, PA-C  desmopressin (DDAVP) 0.2 MG tablet Take 200 mcg by mouth at bedtime. 05/02/20   [provider]  fenofibrate (TRICOR) 145 MG tablet Take 1 tablet (145 mg total) by mouth daily. 12/01/20   Jerline Pain, MD  gabapentin (NEURONTIN) 600 MG tablet Take 1 capsules by mouth every morning, 1 capsule at noon, and take 2 capsules at bedtime. Patient taking differently: Take 600-1,200 mg by mouth See admin instructions. Take 1 capsules by mouth every morning, 1 capsule at noon, and take 2 capsules at bedtime. 12/20/19   Addison Lank, PA-C  HYDROcodone-acetaminophen (NORCO/VICODIN) 5-325 MG tablet Take 1 tablet by mouth every 6 (six) hours as needed. 05/02/20   Fredia Sorrow, MD  ibuprofen (ADVIL) 200 MG tablet Take 400 mg by mouth  every 6 (six) hours as needed for mild pain or moderate pain. Patient not taking: Reported on 12/01/2020    [provider]  insulin glargine (LANTUS SOLOSTAR) 100 UNIT/ML Solostar Pen Inject 12 Units into the skin every morning. And pen needles 1/day 04/04/20   Renato Shin, MD  metoprolol succinate (TOPROL-XL) 100 MG 24 hr tablet Take 1 tablet (100 mg total) by mouth daily. Take with or immediately following a meal. 12/01/20   Jerline Pain, MD  Omega-3 Fatty Acids (FISH OIL) 1000 MG CAPS Take 2 capsules (2,000 mg total) by mouth 2 (two) times daily. 11/01/19   Jerline Pain, MD  PARoxetine (PAXIL) 30 MG tablet Take 2 tablets (60 mg total) by mouth daily. 12/20/19   Donnal Moat T, PA-C  rosuvastatin (CRESTOR) 40 MG tablet Take 1 tablet (40 mg total) by mouth daily. 12/03/20   Jerline Pain, MD      Allergies    Bupropion and Gabapentin    Review of Systems   Review of Systems  All other systems reviewed and are negative.   Physical Exam Updated Vital Signs BP (!) 178/102 (BP Location: Left Arm)   Pulse 79   Temp 98.2 F (36.8 C)   Resp 18   Ht 5\' 8"  (1.727 m)   Wt 98.9 kg   SpO2 99%   BMI 33.15 kg/m  Physical Exam Vitals and nursing note reviewed.  Constitutional:      General: He is not  in acute distress.    Appearance: Normal appearance. He is normal weight. He is not ill-appearing, toxic-appearing or diaphoretic.  HENT:     Head: Normocephalic and atraumatic.  Eyes:     Extraocular Movements: Extraocular movements intact.     Pupils: Pupils are equal, round, and reactive to light.  Cardiovascular:     Rate and Rhythm: Normal rate and regular rhythm.     Heart sounds: Normal heart sounds.  Pulmonary:     Effort: Pulmonary effort is normal. No respiratory distress.     Breath sounds: Normal breath sounds.  Abdominal:     General: Abdomen is flat.     Palpations: Abdomen is soft.  Musculoskeletal:        General: Normal range of motion.     Cervical back:  Normal range of motion.  Skin:    General: Skin is warm and dry.  Neurological:     General: No focal deficit present.     Mental Status: He is alert.  Psychiatric:        Mood and Affect: Mood normal.        Behavior: Behavior normal.     Comments: Does not appear to be responding to internal stimuli     ED Results / Procedures / Treatments   Labs (all labs ordered are listed, but only abnormal results are displayed) Labs Reviewed  COMPREHENSIVE METABOLIC PANEL - Abnormal; Notable for the following components:      Result Value   Sodium 133 (*)    Glucose, Bld 111 (*)    AST 51 (*)    Alkaline Phosphatase 37 (*)    All other components within normal limits  CBC - Abnormal; Notable for the following components:   Hemoglobin 17.7 (*)    MCHC 36.1 (*)    All other components within normal limits  RAPID URINE DRUG SCREEN, HOSP PERFORMED  ETHANOL    EKG None  Radiology No results found.  Procedures Procedures    Medications Ordered in ED Medications - No data to display  ED Course/ Medical Decision Making/ A&P                           Medical Decision Making Risk Prescription drug management.   Patient presents today requesting detox. He is afebrile, non-toxic appearing, and in no acute distress with reassuring vital signs. He is also alert and oriented and neurologically intact without focal deficits. He denies SI/HI or AVH. Currently his CIWA is 3. Last drink was last night. No history of Dts or seizures. Laboratory evaluation reassuring for acute findings. Discussed with patient that we do not admit patients to the hospital for detox. Given librium taper for management of symptoms and given resources for [facility] where he can go after he is discharged. Patient is understanding and amenable with plan, educated on red flag symptoms that would prompt immediate return. Discharged in stable condition.  Findings and plan of care discussed with supervising physician Dr.  Dayna Ramus who is in agreement.     Final Clinical Impression(s) / ED Diagnoses Final diagnoses:  Alcohol abuse    Rx / DC Orders ED Discharge Orders          Ordered    chlordiazePOXIDE (LIBRIUM) 25 MG capsule        08/14/22 1602          An After Visit Summary was printed and given to the patient.  Vear Clock 08/14/22 1606    Clemon Devaul, Shawn Route, PA-C 08/14/22 1635    Kneller, Maskell K, DO 08/14/22 [phone]

## 2022-08-14 NOTE — ED Triage Notes (Signed)
Pt requesting alcohol detox.  Pt reports last rehab was 2014.  Last drink yesterday @ 2130. Denies hx of DT.

## 2022-08-14 NOTE — ED Notes (Addendum)
Pt admitted to [facility] for alcohol use. Pt A&O x4, calm and cooperative. Denies SI/HI/AVH. Pt tolerated skin assessment and admission process well. Pt ambulated independently to unit. Oriented to unit/staff. Water and snack provided per pt request. No signs of acute distress noted. Monitoring for safety.

## 2022-08-14 NOTE — ED Notes (Signed)
Pt resting in bed. Respirations even and unlabored. Monitoring for safety. 

## 2022-08-14 NOTE — ED Notes (Signed)
[facility] called to collect STAT specimens and to deliver to [facility]. ?

## 2022-08-14 NOTE — ED Notes (Signed)
Patient transferred to [facility]. Blood sugar was 65 mg/dl two 4 oz of orange juice given and two gram crackers with peanut butter. Blood suger will be rechecked in 15 minutes from now [facility] staff informed about it.

## 2022-08-14 NOTE — ED Notes (Signed)
Patient A&Ox4. Patient present to [facility] for substance use disorder, Patient denies SI/HI and AVH. Patient denies any physical complaints when asked. No acute distress noted. Support and encouragement provided. Routine safety checks conducted according to facility protocol. Encouraged patient to notify staff if thoughts of harm toward self or others arise. Patient verbalize understanding and agreement. Will continue to monitor for safety.

## 2022-08-14 NOTE — ED Notes (Addendum)
Ileene Musa, PA notified of 15 minute blood sugar recheck of 175. PA also notified that pt states he is supposed to take Paxil 30mg  BID, Buspar 30mg  BID, Desmopressin 0.2mg  at bedtime, Hydralazine 10mg  in the morning, 10mg  at lunch, and 20mg  at bedtime, Tricor 145mg  at bedtime, Crestor 40mg  at bedtime, and "eye drops for pre-glaucoma."

## 2022-08-14 NOTE — ED Notes (Addendum)
Covid results back negative patient will be transferred to [facility]

## 2022-08-14 NOTE — ED Triage Notes (Signed)
Patient presents to [facility] accompanied by his wife with a history of alcohol abuse presents today requesting alcohol detox and alcohol use treatment. Pt states he was sent here from [facility]. He states that for the past 4 years he has been drinking 12-18 beers per day. Last drink was last night, 10 beers. Pt denies withdrawal symptoms at this time. Pt denies SI/HI and AVH.

## 2022-08-14 NOTE — Discharge Instructions (Addendum)
Please take the Librium medication as prescribed in its entirety to help facilitate your detox from alcohol. You have been medically cleared here to go to [facility] for facilitation of your detox. Please pick up your Librium and go there at your earliest convenience. I have attached additional resources to your paperwork as well that you can refer to for any additional needs.  Return if development of any new or worsening symptoms.

## 2022-08-15 ENCOUNTER — Encounter (HOSPITAL_COMMUNITY): Payer: Self-pay | Admitting: Psychiatry

## 2022-08-15 DIAGNOSIS — F1010 Alcohol abuse, uncomplicated: Secondary | ICD-10-CM | POA: Diagnosis not present

## 2022-08-15 LAB — GLUCOSE, CAPILLARY
Glucose-Capillary: 88 mg/dL (ref 70–99)
Glucose-Capillary: 225 mg/dL — ABNORMAL HIGH (ref 70–99)
Glucose-Capillary: 124 mg/dL — ABNORMAL HIGH (ref 70–99)
Glucose-Capillary: 169 mg/dL — ABNORMAL HIGH (ref 70–99)

## 2022-08-15 MED ORDER — LOSARTAN POTASSIUM 50 MG PO TABS
100.0000 mg | ORAL_TABLET | Freq: Every day | ORAL | Status: DC
  Administered 2022-08-15 – 2022-08-17 (×3): 100 mg via ORAL
  Filled 2022-08-15 (×3): qty 2

## 2022-08-15 MED ORDER — PAROXETINE HCL 10 MG PO TABS: 30.0000 mg | ORAL_TABLET | Freq: Two times a day (BID) | ORAL | Status: DC

## 2022-08-15 MED ORDER — INSULIN GLARGINE-YFGN 100 UNIT/ML ~~LOC~~ SOLN
11.0000 [IU] | Freq: Every day | SUBCUTANEOUS | Status: DC
  Administered 2022-08-15 – 2022-08-17 (×3): 11 [IU] via SUBCUTANEOUS

## 2022-08-15 MED ORDER — METOPROLOL SUCCINATE ER 50 MG PO TB24
200.0000 mg | ORAL_TABLET | Freq: Every day | ORAL | Status: DC
  Administered 2022-08-16 – 2022-08-17 (×2): 200 mg via ORAL
  Filled 2022-08-15 (×2): qty 4

## 2022-08-15 MED ORDER — PAROXETINE HCL 10 MG PO TABS
30.0000 mg | ORAL_TABLET | Freq: Two times a day (BID) | ORAL | Status: DC
  Administered 2022-08-16 (×2): 30 mg via ORAL
  Filled 2022-08-15 (×2): qty 3

## 2022-08-15 NOTE — ED Notes (Signed)
Pt is sitting in chair in front of nurses station. No distress noted will continue to monitor patient for safety

## 2022-08-15 NOTE — ED Notes (Signed)
Pt sitting in dining room watching TV. A&O x4, calm and cooperative. Denies current SI/HI/AVH. No signs of distress noted. Monitoring for safety.  

## 2022-08-15 NOTE — ED Notes (Signed)
Patient was receiving his medications this morning. Patient is calm and cooperative. Patient denies SI/HI and AVH. Patient is being monitored for safety.

## 2022-08-15 NOTE — ED Notes (Signed)
While giving patient his morning medications he stated that some medications were not listed. He states he should be on Losartan 100 mg in the morning. And his Hydralazine should have one more night time dose of 20 mg, which right now there are only 2 doses reflected. The patient also stated his metoprolol should be 300 mg vs 100 mg. Advised the patient I will relay the information to the provider.

## 2022-08-15 NOTE — ED Notes (Signed)
Pt is sitting in dining room watching television, no distress noted, will continue to monitor for safety

## 2022-08-15 NOTE — ED Notes (Signed)
Pt sitting by nurse's station, appropriately interacting with staff. Pt states he has difficulty sleep. Pt declined to take PRN sleeping medication before bed but states he may try it tonight. No signs of distress noted. Monitoring for safety.

## 2022-08-15 NOTE — ED Notes (Signed)
Pt in dining room watching television

## 2022-08-15 NOTE — ED Notes (Signed)
Patient is currently in the dining room watching television, no distress noted will continue to monitor patient for safety 

## 2022-08-15 NOTE — ED Notes (Signed)
Patients Blood sugar is 225.

## 2022-08-15 NOTE — ED Notes (Signed)
Pt asleep in bed. Respirations even and unlabored. Monitoring for safety. 

## 2022-08-15 NOTE — ED Provider Notes (Signed)
Behavioral Health Progress Note  Date and Time: 08/15/2022 9:34 AM Name: Oscar Ruiz MRN:  [id number]  Subjective: Patient seen and evaluated face-to-face by this provider, chart reviewed and case discussed with Dr. Gasper Sells. On evaluation, patient is alert and oriented x 4. His thought process is logical and speech is clear and coherent. His mood is anxious and affect is congruent. He denies suicidal ideations. He denies homicidal ideations. He denies auditory or visual hallucinations. There is no objective evidence that the patient is currently responding to internal or external stimuli. He denies depressive or anxiety symptoms at this time. He reports "okay" sleep last night but states that the bed is hard. He denies using illicit drugs. He states that he last drank alcohol on Friday. He denies alcohol withdrawal symptoms at this time. CIWA is 0. He does not appear to be in acute alcohol withdrawal. He voiced concerns regarding his blood pressure medications this morning. He states that he is prescribed hydralazine 10 mg p.o. twice daily and 20 mg at night, metoprolol 300 mg p.o. daily, and losartan 100 mg daily. Pharmacy confirmed that the patient is prescribed the stated antihypertensive medications. However, I will restart the losartan 100 mg p.o. today, increase the metoprolol from 100 to 200 mg daily and continue to monitor blood pressure and continue the hydralazine 10 mg p.o. twice daily and 20 mg at bedtime.  HPI: Per chart review: Oscar Ruiz is a 46 year old Caucasian male who presented to [facility] urgent care accompanied by his wife seeking detox treatment. States he drinks 12-18 beers daily. States due to his declining health he would like to get his alcohol abuse under wraps. Reports previous inpatient facility for substance abuse treatment with opiates and alcohol. Currently he is denying suicidal or homicidal ideations. Denies auditory visual hallucinations. Reports mild  frustration related to his waiting in the emergency department without being treated. Chart review patient was discharged on a Librium taper with additional outpatient resources. Patient was initially reluctant to stay for treatment. States his main concerns as hypertension, anxiety and some depression.    Diagnosis:  Final diagnoses:  Alcohol abuse    Total Time spent with patient: 30 minutes  Past Psychiatric History: History os substance abuse, alcohol dependence, panic attacks and anxiety.  Past Medical History:  Past Medical History:  Diagnosis Date   Anxiety    Diabetes mellitus without complication (HCC)    Hypertension    Seizures (HCC)    Substance abuse (HCC)    tobacco and alcohol    Past Surgical History:  Procedure Laterality Date   HERNIA REPAIR     Family History:  Family History  Problem Relation Age of Onset   Diabetes Mother    Alcohol abuse Father    Hyperlipidemia Father    Coronary artery disease Father    Heart failure Father    Family Psychiatric  History: Family history of alcohol abuse, father, (P) grandmother and (P) aunt.  Social History:  Social History   Substance and Sexual Activity  Alcohol Use Yes   Alcohol/week: 36.0 standard drinks of alcohol   Types: 36 Cans of beer per week   Comment: was in remission for 2 years in rehab 2017     Social History   Substance and Sexual Activity  Drug Use No    Social History   Socioeconomic History   Marital status: Married    Spouse name: Not on file   Number of children: 2   Years  of education: Not on file   Highest education level: Not on file  Occupational History   Occupation: plumbing    Employer: SELF  Tobacco Use   Smoking status: Every Day    Packs/day: 2.00    Types: Cigarettes   Smokeless tobacco: Former  Building services engineer Use: Never used  Substance and Sexual Activity   Alcohol use: Yes    Alcohol/week: 36.0 standard drinks of alcohol    Types: 36 Cans of beer per  week    Comment: was in remission for 2 years in rehab 2017   Drug use: No   Sexual activity: Yes    Birth control/protection: None  Other Topics Concern   Not on file  Social History Narrative   Regular exercise-yes   Diet: Fast food, skips meals   Social Determinants of Health   Financial Resource Strain: Not on file  Food Insecurity: Not on file  Transportation Needs: Not on file  Physical Activity: Not on file  Stress: Not on file  Social Connections: Not on file   SDOH:  SDOH Screenings   Tobacco Use: High Risk (08/15/2022)   Additional Social History:    Pain Medications: See MAR Prescriptions: See MAR Over the Counter: See MAR History of alcohol / drug use?: Yes Longest period of sobriety (when/how long): 6 months Negative Consequences of Use: Personal relationships Withdrawal Symptoms: Agitation, Sweats, Weakness Name of Substance 1: Alcohol 1 - Age of First Use: 18 1 - Amount (size/oz): Pt reports he had been drinking up to 24 12 oz beers daily until the last week when he has cut back to 8 to 10 beers 1 - Frequency: Daily 1 - Duration: Last year 1 - Last Use / Amount: Last night 9/22 when he reported he had "a few beers" 1 - Method of Aquiring: NA 1- Route of Use: Oral    Current Medications:  Current Facility-Administered Medications  Medication Dose Route Frequency Provider Last Rate Last Admin   acetaminophen (TYLENOL) tablet 650 mg  650 mg Oral Q6H PRN Oneta Rack, NP       alum & mag hydroxide-simeth (MAALOX/MYLANTA) 200-200-20 MG/5ML suspension 30 mL  30 mL Oral Q4H PRN Oneta Rack, NP       busPIRone (BUSPAR) tablet 30 mg  30 mg Oral BID Nwoko, Uchenna E, PA   30 mg at 08/15/22 0926   desmopressin (DDAVP) tablet 200 mcg  200 mcg Oral QHS Nwoko, Uchenna E, PA   200 mcg at 08/14/22 2310   fenofibrate tablet 160 mg  160 mg Oral Daily Nwoko, Uchenna E, PA   160 mg at 08/15/22 0926   hydrALAZINE (APRESOLINE) tablet 10 mg  10 mg Oral BID BM Nwoko,  Uchenna E, PA   10 mg at 08/15/22 0759   hydrALAZINE (APRESOLINE) tablet 20 mg  20 mg Oral QHS Nwoko, Uchenna E, PA   20 mg at 08/14/22 2301   hydrOXYzine (ATARAX) tablet 25 mg  25 mg Oral TID PRN Oneta Rack, NP       hydrOXYzine (ATARAX) tablet 25 mg  25 mg Oral Q6H PRN Lewis, Jerene Pitch, NP       insulin aspart (novoLOG) injection 0-5 Units  0-5 Units Subcutaneous QHS Lewis, Tanika N, NP       insulin aspart (novoLOG) injection 0-9 Units  0-9 Units Subcutaneous TID WC Lewis, Jerene Pitch, NP       insulin glargine-yfgn (SEMGLEE) injection 11 Units  11  Units Subcutaneous Daily Nelly Rout, MD       loperamide (IMODIUM) capsule 2-4 mg  2-4 mg Oral PRN Oneta Rack, NP       LORazepam (ATIVAN) tablet 1 mg  1 mg Oral Q6H PRN Oneta Rack, NP       LORazepam (ATIVAN) tablet 1 mg  1 mg Oral QID Oneta Rack, NP   1 mg at 08/15/22 5102   Followed by   Melene Muller ON 08/16/2022] LORazepam (ATIVAN) tablet 1 mg  1 mg Oral TID Oneta Rack, NP       Followed by   Melene Muller ON 08/17/2022] LORazepam (ATIVAN) tablet 1 mg  1 mg Oral BID Oneta Rack, NP       Followed by   Melene Muller ON 08/18/2022] LORazepam (ATIVAN) tablet 1 mg  1 mg Oral Daily Oneta Rack, NP       magnesium hydroxide (MILK OF MAGNESIA) suspension 30 mL  30 mL Oral Daily PRN Oneta Rack, NP       metoprolol succinate (TOPROL-XL) 24 hr tablet 100 mg  100 mg Oral Daily Oneta Rack, NP   100 mg at 08/15/22 5852   multivitamin with minerals tablet 1 tablet  1 tablet Oral Daily Oneta Rack, NP   1 tablet at 08/15/22 7782   nicotine (NICODERM CQ - dosed in mg/24 hours) patch 21 mg  21 mg Transdermal Daily Oneta Rack, NP   21 mg at 08/15/22 0932   ondansetron (ZOFRAN-ODT) disintegrating tablet 4 mg  4 mg Oral Q6H PRN Oneta Rack, NP       PARoxetine (PAXIL) tablet 60 mg  60 mg Oral Daily Nwoko, Uchenna E, PA   60 mg at 08/15/22 0926   rosuvastatin (CRESTOR) tablet 40 mg  40 mg Oral Daily Nwoko, Uchenna E, PA   40 mg at  08/15/22 4235   thiamine (VITAMIN B1) tablet 100 mg  100 mg Oral Daily Oneta Rack, NP   100 mg at 08/15/22 3614   traZODone (DESYREL) tablet 50 mg  50 mg Oral QHS PRN Oneta Rack, NP       Current Outpatient Medications  Medication Sig Dispense Refill   busPIRone (BUSPAR) 30 MG tablet Take 1 tablet (30 mg total) by mouth 2 (two) times daily. 60 tablet 5   chlordiazePOXIDE (LIBRIUM) 25 MG capsule 50mg  PO TID x 1D, then 25-50mg  PO BID X 1D, then 25-50mg  PO QD X 1D 10 capsule 0   desmopressin (DDAVP) 0.2 MG tablet Take 200 mcg by mouth at bedtime.     fenofibrate (TRICOR) 145 MG tablet Take 1 tablet (145 mg total) by mouth daily. 30 tablet 11   gabapentin (NEURONTIN) 600 MG tablet Take 1 capsules by mouth every morning, 1 capsule at noon, and take 2 capsules at bedtime. (Patient taking differently: Take 600-1,200 mg by mouth See admin instructions. Take 1 capsules by mouth every morning, 1 capsule at noon, and take 2 capsules at bedtime.) 120 tablet 5   HYDROcodone-acetaminophen (NORCO/VICODIN) 5-325 MG tablet Take 1 tablet by mouth every 6 (six) hours as needed. 14 tablet 0   ibuprofen (ADVIL) 200 MG tablet Take 400 mg by mouth every 6 (six) hours as needed for mild pain or moderate pain. (Patient not taking: Reported on 12/01/2020)     insulin glargine (LANTUS SOLOSTAR) 100 UNIT/ML Solostar Pen Inject 12 Units into the skin every morning. And pen needles 1/day 15 mL 11  metoprolol succinate (TOPROL-XL) 100 MG 24 hr tablet Take 1 tablet (100 mg total) by mouth daily. Take with or immediately following a meal. 30 tablet 11   Omega-3 Fatty Acids (FISH OIL) 1000 MG CAPS Take 2 capsules (2,000 mg total) by mouth 2 (two) times daily.  0   PARoxetine (PAXIL) 30 MG tablet Take 2 tablets (60 mg total) by mouth daily. 60 tablet 5   rosuvastatin (CRESTOR) 40 MG tablet Take 1 tablet (40 mg total) by mouth daily. 90 tablet 3    Labs  Lab Results:  Admission on 08/14/2022  Component Date Value Ref  Range Status   SARS Coronavirus 2 by RT PCR 08/14/2022 NEGATIVE  NEGATIVE Final   Comment: (NOTE) SARS-CoV-2 target nucleic acids are NOT DETECTED.  The SARS-CoV-2 RNA is generally detectable in upper respiratory specimens during the acute phase of infection. The lowest concentration of SARS-CoV-2 viral copies this assay can detect is 138 copies/mL. A negative result does not preclude SARS-Cov-2 infection and should not be used as the sole basis for treatment or other patient management decisions. A negative result may occur with  improper specimen collection/handling, submission of specimen other than nasopharyngeal swab, presence of viral mutation(s) within the areas targeted by this assay, and inadequate number of viral copies(<138 copies/mL). A negative result must be combined with clinical observations, patient history, and epidemiological information. The expected result is Negative.  Fact Sheet for Patients:  BloggerCourse.com  Fact Sheet for Healthcare Providers:  SeriousBroker.it  This test is no                          t yet approved or cleared by the [place] FDA and  has been authorized for detection and/or diagnosis of SARS-CoV-2 by FDA under an Emergency Use Authorization (EUA). This EUA will remain  in effect (meaning this test can be used) for the duration of the COVID-19 declaration under Section 564(b)(1) of the Act, 21 U.S.C.section 360bbb-3(b)(1), unless the authorization is terminated  or revoked sooner.       Influenza A by PCR 08/14/2022 NEGATIVE  NEGATIVE Final   Influenza B by PCR 08/14/2022 NEGATIVE  NEGATIVE Final   Comment: (NOTE) The Xpert Xpress SARS-CoV-2/FLU/RSV plus assay is intended as an aid in the diagnosis of influenza from Nasopharyngeal swab specimens and should not be used as a sole basis for treatment. Nasal washings and aspirates are unacceptable for Xpert Xpress  SARS-CoV-2/FLU/RSV testing.  Fact Sheet for Patients: BloggerCourse.com  Fact Sheet for Healthcare Providers: SeriousBroker.it  This test is not yet approved or cleared by the [place] FDA and has been authorized for detection and/or diagnosis of SARS-CoV-2 by FDA under an Emergency Use Authorization (EUA). This EUA will remain in effect (meaning this test can be used) for the duration of the COVID-19 declaration under Section 564(b)(1) of the Act, 21 U.S.C. section 360bbb-3(b)(1), unless the authorization is terminated or revoked.  Performed at [facility], [address], [place], [place] [address]    POC Amphetamine UR 08/14/2022 None Detected  NONE DETECTED (Cut Off Level 1000 ng/mL) Final   POC Secobarbital (BAR) 08/14/2022 None Detected  NONE DETECTED (Cut Off Level 300 ng/mL) Final   POC Buprenorphine (BUP) 08/14/2022 None Detected  NONE DETECTED (Cut Off Level 10 ng/mL) Final   POC Oxazepam (BZO) 08/14/2022 None Detected  NONE DETECTED (Cut Off Level 300 ng/mL) Final   POC Cocaine UR 08/14/2022 None Detected  NONE  DETECTED (Cut Off Level 300 ng/mL) Final   POC Methamphetamine UR 08/14/2022 None Detected  NONE DETECTED (Cut Off Level 1000 ng/mL) Final   POC Morphine 08/14/2022 None Detected  NONE DETECTED (Cut Off Level 300 ng/mL) Final   POC Methadone UR 08/14/2022 None Detected  NONE DETECTED (Cut Off Level 300 ng/mL) Final   POC Oxycodone UR 08/14/2022 None Detected  NONE DETECTED (Cut Off Level 100 ng/mL) Final   POC Marijuana UR 08/14/2022 None Detected  NONE DETECTED (Cut Off Level 50 ng/mL) Final   Hgb A1c MFr Bld 08/14/2022 6.7 (H)  4.8 - 5.6 % Final   Comment: (NOTE) Pre diabetes:          5.7%-6.4%  Diabetes:              >6.4%  Glycemic control for   <7.0% adults with diabetes    Mean Plasma Glucose 08/14/2022 145.59  mg/dL Final   Performed at [facility], [address] [place],  [place] [id number]   SARSCOV2ONAVIRUS 2 AG 08/14/2022 NEGATIVE  NEGATIVE Final   Comment: (NOTE) SARS-CoV-2 antigen NOT DETECTED.   Negative results are presumptive.  Negative results do not preclude SARS-CoV-2 infection and should not be used as the sole basis for treatment or other patient management decisions, including infection  control decisions, particularly in the presence of clinical signs and  symptoms consistent with COVID-19, or in those who have been in contact with the virus.  Negative results must be combined with clinical observations, patient history, and epidemiological information. The expected result is Negative.  Fact Sheet for Patients: https://www.jennings-kim.com/https://www.fda.gov/media/141569/download  Fact Sheet for Healthcare Providers: https://alexander-rogers.biz/https://www.fda.gov/media/141568/download  This test is not yet approved or cleared by the [place] FDA and  has been authorized for detection and/or diagnosis of SARS-CoV-2 by FDA under an Emergency Use Authorization (EUA).  This EUA will remain in effect (meaning this test can be used) for the duration of  the COV                          ID-19 declaration under Section 564(b)(1) of the Act, 21 U.S.C. section 360bbb-3(b)(1), unless the authorization is terminated or revoked sooner.     Glucose-Capillary 08/14/2022 65 (L)  70 - 99 mg/dL Final   Glucose reference range applies only to samples taken after fasting for at least 8 hours.   Glucose-Capillary 08/14/2022 175 (H)  70 - 99 mg/dL Final   Glucose reference range applies only to samples taken after fasting for at least 8 hours.   Glucose-Capillary 08/15/2022 88  70 - 99 mg/dL Final   Glucose reference range applies only to samples taken after fasting for at least 8 hours.  Admission on 08/14/2022, Discharged on 08/14/2022  Component Date Value Ref Range Status   Sodium 08/14/2022 133 (L)  135 - 145 mmol/L Final   Potassium 08/14/2022 4.0  3.5 - 5.1 mmol/L Final   Chloride 08/14/2022 100  98 -  111 mmol/L Final   CO2 08/14/2022 23  22 - 32 mmol/L Final   Glucose, Bld 08/14/2022 111 (H)  70 - 99 mg/dL Final   Glucose reference range applies only to samples taken after fasting for at least 8 hours.   BUN 08/14/2022 12  6 - 20 mg/dL Final   Creatinine, Ser 08/14/2022 1.18  0.61 - 1.24 mg/dL Final   Calcium 60/45/409809/23/2023 9.8  8.9 - 10.3 mg/dL Final   Total Protein 11/91/478209/23/2023 7.8  6.5 - 8.1 g/dL Final   Albumin 31/49/7026 4.7  3.5 - 5.0 g/dL Final   AST 37/85/8850 51 (H)  15 - 41 U/L Final   ALT 08/14/2022 35  0 - 44 U/L Final   Alkaline Phosphatase 08/14/2022 37 (L)  38 - 126 U/L Final   Total Bilirubin 08/14/2022 0.9  0.3 - 1.2 mg/dL Final   GFR, Estimated 08/14/2022 >60  >60 mL/min Final   Comment: (NOTE) Calculated using the CKD-EPI Creatinine Equation (2021)    Anion gap 08/14/2022 10  5 - 15 Final   Performed at [facility], [address], [place], [place] [address]   WBC 08/14/2022 9.4  4.0 - 10.5 K/uL Final   RBC 08/14/2022 5.53  4.22 - 5.81 MIL/uL Final   Hemoglobin 08/14/2022 17.7 (H)  13.0 - 17.0 g/dL Final   HCT 28/78/6767 49.0  39.0 - 52.0 % Final   MCV 08/14/2022 88.6  80.0 - 100.0 fL Final   MCH 08/14/2022 32.0  26.0 - 34.0 pg Final   MCHC 08/14/2022 36.1 (H)  30.0 - 36.0 g/dL Final   RDW 20/94/7096 11.8  11.5 - 15.5 % Final   Platelets 08/14/2022 176  150 - 400 K/uL Final   nRBC 08/14/2022 0.0  0.0 - 0.2 % Final   Performed at [facility], [address] [place], [place] [address]   Opiates 08/14/2022 NONE DETECTED  NONE DETECTED Final   Cocaine 08/14/2022 NONE DETECTED  NONE DETECTED Final   Benzodiazepines 08/14/2022 NONE DETECTED  NONE DETECTED Final   Amphetamines 08/14/2022 NONE DETECTED  NONE DETECTED Final   Tetrahydrocannabinol 08/14/2022 NONE DETECTED  NONE DETECTED Final   Barbiturates 08/14/2022 NONE DETECTED  NONE DETECTED Final   Comment: (NOTE) DRUG SCREEN FOR MEDICAL PURPOSES ONLY.  IF CONFIRMATION IS  NEEDED FOR ANY PURPOSE, NOTIFY LAB WITHIN 5 DAYS.  LOWEST DETECTABLE LIMITS FOR URINE DRUG SCREEN Drug Class                     Cutoff (ng/mL) Amphetamine and metabolites    1000 Barbiturate and metabolites    200 Benzodiazepine                 200 Tricyclics and metabolites     300 Opiates and metabolites        300 Cocaine and metabolites        300 THC                            50 Performed at [facility], [address], [place], [place] [address]     Blood Alcohol level:  Lab Results  Component Value Date   [facility] <11 12/02/2013    Metabolic Disorder Labs: Lab Results  Component Value Date   HGBA1C 6.7 (H) 08/14/2022   MPG 145.59 08/14/2022   No results found for: "PROLACTIN" Lab Results  Component Value Date   CHOL 440 (H) 10/31/2019   TRIG 3,788 (HH) 10/31/2019   HDL 10 (L) 10/31/2019   CHOLHDL 44.0 (H) 10/31/2019   VLDL 49.0 (H) 03/14/2014   LDLCALC Comment (A) 10/31/2019   LDLCALC Comment 02/21/2017    Therapeutic Lab Levels: No results found for: "LITHIUM" No results found for: "VALPROATE" No results found for: "CBMZ"  Physical Findings   AUDIT    Flowsheet Row [facility]  Alcohol  Use Disorder Identification Test Final Score (AUDIT) 23      Flowsheet Row ED from 08/14/2022 in [facility] Most recent reading at 08/14/2022 11:37 PM ED from 08/14/2022 in [facility] Most recent reading at 08/14/2022 11:43 AM  C-SSRS RISK CATEGORY No Risk No Risk        Musculoskeletal  Strength & Muscle Tone: within normal limits Gait & Station: normal Patient leans: N/A  Psychiatric Specialty Exam  Presentation  General Appearance: Appropriate for Environment  Eye Contact:Fair  Speech:Clear and Coherent  Speech Volume:Normal  Handedness:Right   Mood and Affect   Mood:Anxious  Affect:Congruent   Thought Process  Thought Processes:Coherent  Descriptions of Associations:Intact  Orientation:Full (Time, Place and Person)  Thought Content:Logical  Diagnosis of Schizophrenia or Schizoaffective disorder in past: No    Hallucinations:Hallucinations: None  Ideas of Reference:None  Suicidal Thoughts:Suicidal Thoughts: No  Homicidal Thoughts:Homicidal Thoughts: No   Sensorium  Memory:Immediate Fair; Recent Fair; Remote Fair  Judgment:Fair  Insight:Fair   Executive Functions  Concentration:Fair  Attention Span:Fair  [place]   Psychomotor Activity  Psychomotor Activity:Psychomotor Activity: Normal   Assets  Assets:Communication Skills; Desire for Improvement; Housing; Catering manager; Physical Health; Social Support   Sleep  Sleep:Sleep: Poor   Nutritional Assessment (For OBS and FBC admissions only) Has the patient had a weight loss or gain of 10 pounds or more in the last 3 months?: No Has the patient had a decrease in food intake/or appetite?: No Does the patient have dental problems?: No Does the patient have eating habits or behaviors that may be indicators of an eating disorder including binging or inducing vomiting?: No Has the patient recently lost weight without trying?: 0 Has the patient been eating poorly because of a decreased appetite?: 0 Malnutrition Screening Tool Score: 0    Physical Exam  Physical Exam HENT:     Head: Normocephalic.     Nose: Nose normal.  Eyes:     Conjunctiva/sclera: Conjunctivae normal.  Cardiovascular:     Rate and Rhythm: Normal rate.  Musculoskeletal:        General: Normal range of motion.     Cervical back: Normal range of motion.  Neurological:     Mental Status: He is alert and oriented to person, place, and time.    Review of Systems  Constitutional: Negative.   HENT: Negative.    Eyes: Negative.    Respiratory: Negative.    Cardiovascular: Negative.   Gastrointestinal: Negative.   Genitourinary: Negative.   Musculoskeletal: Negative.   Skin: Negative.   Neurological: Negative.   Endo/Heme/Allergies: Negative.    Blood pressure (!) 158/88, pulse 72, temperature 98.9 F (37.2 C), temperature source Tympanic, resp. rate 18, SpO2 99 %. There is no height or weight on file to calculate BMI.  Treatment Plan Summary: Patient admitted to the [facility] facility based crisis unit for mood stabilization and alcohol detox. Patient is voluntary.   Medications: Continue Ativan 1 mg po taper for AUD  Continue Paxil, patient requested Paxil 60 mg be changed to 30 mg BID how he currently takes medication at home for anxiety   Continue hydroxyzine 25 mg po TID for anxiety as needed   Continue buspar 30 mg po BID for anxiety   Restart the losartan 100 mg p.o. daily   Increase the metoprolol from 100 to 200 mg daily and continue to monitor blood pressure. Goal is to titrate to home dose  of 300 mg if appropriate   Continue Hydralazine 10 mg p.o. twice daily and 20 mg at bedtime.  Discuss with patient starting naltrexone for AUD     Layla Barter, NP 08/15/2022 9:34 AM

## 2022-08-15 NOTE — ED Notes (Signed)
Patients blood sugar is 124.

## 2022-08-16 ENCOUNTER — Encounter (HOSPITAL_COMMUNITY): Payer: Self-pay | Admitting: Psychiatry

## 2022-08-16 DIAGNOSIS — F1010 Alcohol abuse, uncomplicated: Secondary | ICD-10-CM | POA: Diagnosis not present

## 2022-08-16 LAB — GLUCOSE, CAPILLARY
Glucose-Capillary: 151 mg/dL — ABNORMAL HIGH (ref 70–99)
Glucose-Capillary: 226 mg/dL — ABNORMAL HIGH (ref 70–99)
Glucose-Capillary: 141 mg/dL — ABNORMAL HIGH (ref 70–99)
Glucose-Capillary: 156 mg/dL — ABNORMAL HIGH (ref 70–99)

## 2022-08-16 MED ORDER — LORAZEPAM 1 MG PO TABS
1.0000 mg | ORAL_TABLET | Freq: Two times a day (BID) | ORAL | Status: AC
  Administered 2022-08-17: 1 mg via ORAL
  Filled 2022-08-16: qty 1

## 2022-08-16 MED ORDER — PAROXETINE HCL 30 MG PO TABS: 30.0000 mg | ORAL_TABLET | Freq: Two times a day (BID) | ORAL | 0 refills | Status: AC

## 2022-08-16 MED ORDER — ROSUVASTATIN CALCIUM 40 MG PO TABS: 40.0000 mg | ORAL_TABLET | Freq: Every evening | ORAL | 0 refills | Status: AC

## 2022-08-16 MED ORDER — VITAMIN B-1 100 MG PO TABS: 100.0000 mg | ORAL_TABLET | Freq: Every day | ORAL | 0 refills | Status: AC

## 2022-08-16 MED ORDER — FENOFIBRATE 160 MG PO TABS: 160.0000 mg | ORAL_TABLET | Freq: Every day | ORAL | 0 refills | Status: AC

## 2022-08-16 MED ORDER — NALTREXONE HCL 50 MG PO TABS: 50.0000 mg | ORAL_TABLET | Freq: Every day | ORAL | 0 refills | Status: AC

## 2022-08-16 MED ORDER — METOPROLOL SUCCINATE ER 200 MG PO TB24: 200.0000 mg | ORAL_TABLET | Freq: Every day | ORAL | 0 refills | Status: AC

## 2022-08-16 MED ORDER — NALTREXONE HCL 50 MG PO TABS
50.0000 mg | ORAL_TABLET | Freq: Every day | ORAL | Status: DC
  Administered 2022-08-16 – 2022-08-17 (×2): 50 mg via ORAL
  Filled 2022-08-16: qty 7
  Filled 2022-08-16 (×2): qty 1

## 2022-08-16 MED ORDER — LORAZEPAM 1 MG PO TABS: 1.0000 mg | ORAL_TABLET | Freq: Two times a day (BID) | ORAL | Status: DC

## 2022-08-16 NOTE — ED Notes (Signed)
Snacks given 

## 2022-08-16 NOTE — ED Provider Notes (Signed)
[facility]/OBS ASAP Discharge Summary  Date and Time: 08/17/2022  Name: Oscar Ruiz  Age: 46 y.o.  DOB: [birth year]  MRN:  [id number]   Discharge Diagnoses:  Final diagnoses:  Alcohol abuse  Generalized anxiety disorder  TOBACCO ABUSE  Essential hypertension, benign  Type 2 diabetes mellitus without complication, with long-term current use of insulin (HCC)    HPI: Per H&P: " Oscar Ruiz is a 46 year old Caucasian male [facility] by [facility] wife seeking detox treatment.  States he drinks 12-18 beers daily.  States due to his declining health he would like to get his alcohol abuse under wraps.  Reports previous inpatient facility for substance abuse treatment with opiates and alcohol.  Currently he is denying suicidal or homicidal ideations.  Denies auditory visual hallucinations.  Reports mild frustration related to his waiting in the emergency department without being treated.  Chart review patient was discharged on a Librium taper with additional outpatient resources.  Patient was initially reluctant to stay for treatment.  States his main concerns as hypertension, anxiety and some depression.   He denied history of delirium tremors and or withdrawal seizures.  Denying any other illicit drug use currently.  States his last drink was 24 hours ago. "  If I leave here I will just go drink."   During evaluation Oscar Ruiz is standing in no acute distress.  he is alert/oriented x 4; calm/cooperative; and mood congruent with affect. he is speaking in a clear tone at moderate volume, and normal pace; with good eye contact. His thought process is coherent and relevant; There is no indication that he is currently responding to internal/external stimuli or experiencing delusional thought content; andhe has denied suicidal/self-harm/homicidal ideation, psychosis, and paranoia. Patient has remained calm throughout assessment and has answered questions  appropriately.   " Past Psychiatric History: History of substance abuse, alcohol dependence, panic attacks and anxiety.  Family Psychiatric History: Family history of alcohol abuse, father, (P) grandmother and (P) aunt.    Subjective: Patient was seen this AM, initially asleep, awoke easily, no acute distress. Patient denied withdrawal sxs and cravings at this time. Stated that he feels "good", with stable and appropriate sleep and appetite. Stated that his goals are to be able to go back home to resume work. Declined residential and PHP/IOP due to wanting to resume work. Declined need for work note. He inquired about his BP, stated that currently is it stable currently. Discussed importance of him continuing to follow-up with his PCP. He had no other questions or concerns.    OZ:HYQMVHQISI:Suicidal Thoughts: No ON:GEXBMWUXLHI:Homicidal Thoughts: No KGM:[id number]:Hallucinations: None Ideas of KVQ:QVZDRef:None    Mood: Euthymic ("good") Sleep:Fair Appetite:Good   Stay Summary:  Oscar Ruiz is a 46 y.o. male with PMH of AUD, tobacco use d/o, GAD, DM2, HLD, HTN who [facility] with [facility]. Total duration of encounter: 2 days   Patient was engaged with treatment, adherent to scheduled medications, and had no behavioral concerns.   AUD Continue Ativan 1 mg po taper for AUD (ends 9/26) Started naltrexone 50 mg daily CIWA per protocol with thiamine, MV   Anxiety Continue Paxil 30 mg BID-per pt request, how he currently takes medication at home for anxiety  Continue hydroxyzine 25 mg po TID for anxiety as needed  Continue buspar 30 mg po BID for anxiety    Tobacco use d/o NRT Encouraged cessation   HTN Continued the  losartan 100 mg p.o. daily  Continued metoprolol 200 mg daily and continue to monitor blood pressure. Goal is to titrate to home dose of 300 mg if appropriate  Continue Hydralazine 10 mg p.o. twice daily and 20 mg at bedtime.   DM2 Desmopressin  Insulin with CBGs   HLD Atorvastatin 40mg  daily   DISPO: Tentative Date: 08/18/2022 Location: Home with family    Medication List    START taking these medications    naltrexone 50 MG tablet; Commonly known as: DEPADE; Take 1 tablet (50 mg  total) by mouth daily.  thiamine 100 MG tablet; Commonly known as: Vitamin B-1; Take 1 tablet  (100 mg total) by mouth daily.; Start taking on: August 17, 2022   CHANGE how you take these medications    fenofibrate 160 MG tablet; Take 1 tablet (160 mg total) by mouth daily.;  Start taking on: August 17, 2022; What changed: medication strength,  how much to take   CONTINUE taking these medications    busPIRone 30 MG tablet; Commonly known as: BUSPAR; Take 1 tablet (30 mg  total) by mouth 2 (two) times daily.  desmopressin 0.2 MG tablet; Commonly known as: DDAVP  Fish Oil 1000 MG Caps; Take 2 capsules (2,000 mg total) by mouth 2 (two)  times daily.  hydrALAZINE 10 MG tablet; Commonly known as: APRESOLINE  Lantus SoloStar 100 UNIT/ML Solostar Pen; Generic drug: insulin  glargine; Inject 12 Units into the skin every morning. And pen needles  1/day  latanoprost 0.005 % ophthalmic solution; Commonly known as: XALATAN  losartan 100 MG tablet; Commonly known as: COZAAR  metoprolol 200 MG 24 hr tablet; Commonly known as: TOPROL-XL; Take 1  tablet (200 mg total) by mouth daily. Take with or immediately following a  meal.; Start taking on: August 17, 2022  PARoxetine 30 MG tablet; Commonly known as: PAXIL; Take 1 tablet (30 mg  total) by mouth 2 (two) times daily.  rosuvastatin 40 MG tablet; Commonly known as: CRESTOR; Take 1 tablet (40  mg total) by mouth at bedtime.   STOP taking these medications    chlordiazePOXIDE 25 MG capsule; Commonly known as: LIBRIUM  gabapentin 600 MG tablet; Commonly known as: NEURONTIN  HYDROcodone-acetaminophen 5-325 MG tablet; Commonly known as:  NORCO/VICODIN        Clinical Course as of  08/16/22 1644  Mon Aug 16, 2022  0827 Hemoglobin A1C(!): 6.7 [JN]    Clinical Course User Index [JN] Aug 18, 2022, DO   Past Psychiatric History: Per H&P Past Medical History:  Past Medical History:  Diagnosis Date   Anxiety    Diabetes mellitus without complication (HCC)    Hyperlipidemia associated with type 2 diabetes mellitus (HCC) 05/07/2009   Qualifier: Diagnosis of  By: 05/09/2009 MD, Amy     Hypertension    Seizures (HCC)    Substance abuse (HCC)    tobacco and alcohol    Past Surgical History:  Procedure Laterality Date   HERNIA REPAIR     Family History:  Family History  Problem Relation Age of Onset   Diabetes Mother    Alcohol abuse Father    Hyperlipidemia Father    Coronary artery disease Father    Heart failure Father    Family Psychiatric History: Per H&P Social History:  Social History   Substance and Sexual Activity  Alcohol Use Yes   Alcohol/week: 36.0 standard drinks of alcohol   Types: 36 Cans of beer per week   Comment: was in  remission for 2 years in rehab 2017     Social History   Substance and Sexual Activity  Drug Use No    Social History   Socioeconomic History   Marital status: Married    Spouse name: Not on file   Number of children: 2   Years of education: Not on file   Highest education level: Not on file  Occupational History   Occupation: plumbing    Employer: SEL[employer]  Tobacco Use   Smoking status: Every Day    Packs/day: 2.00    Types: Cigarettes   Smokeless tobacco: Former  Building services engineer Use: Never used  Substance and Sexual Activity   Alcohol use: Yes    Alcohol/week: 36.0 standard drinks of alcohol    Types: 36 Cans of beer per week    Comment: was in remission for 2 years in rehab 2017   Drug use: No   Sexual activity: Yes    Birth control/protection: None  Other Topics Concern   Not on file  Social History Narrative   Regular exercise-yes   Diet: Fast food, skips meals   Social Determinants of  Health   Financial Resource Strain: Not on file  Food Insecurity: Not on file  Transportation Needs: Not on file  Physical Activity: Not on file  Stress: Not on file  Social Connections: Not on file   SDOH:  SDOH Screenings   Tobacco Use: High Risk (08/16/2022)   Tobacco Cessation:  A prescription for an FDA-approved tobacco cessation medication was offered at discharge and the patient refused  Current Medications:  Current Facility-Administered Medications  Medication Dose Route Frequency Provider Last Rate Last Admin   acetaminophen (TYLENOL) tablet 650 mg  650 mg Oral Q6H PRN Oneta Rack, NP       alum & mag hydroxide-simeth (MAALOX/MYLANTA) 200-200-20 MG/5ML suspension 30 mL  30 mL Oral Q4H PRN Oneta Rack, NP       busPIRone (BUSPAR) tablet 30 mg  30 mg Oral BID Nwoko, Uchenna E, PA   30 mg at 08/16/22 0914   desmopressin (DDAVP) tablet 200 mcg  200 mcg Oral QHS Nwoko, Uchenna E, PA   200 mcg at 08/15/22 2115   fenofibrate tablet 160 mg  160 mg Oral Daily Nwoko, Uchenna E, PA   160 mg at 08/16/22 0915   hydrALAZINE (APRESOLINE) tablet 10 mg  10 mg Oral BID BM Nwoko, Uchenna E, PA   10 mg at 08/16/22 1157   hydrALAZINE (APRESOLINE) tablet 20 mg  20 mg Oral QHS Nwoko, Uchenna E, PA   20 mg at 08/15/22 2110   hydrOXYzine (ATARAX) tablet 25 mg  25 mg Oral TID PRN Oneta Rack, NP       insulin aspart (novoLOG) injection 0-5 Units  0-5 Units Subcutaneous QHS Lewis, Tanika N, NP       insulin aspart (novoLOG) injection 0-9 Units  0-9 Units Subcutaneous TID WC Oneta Rack, NP   3 Units at 08/16/22 1200   insulin glargine-yfgn (SEMGLEE) injection 11 Units  11 Units Subcutaneous Daily Nelly Rout, MD   11 Units at 08/16/22 0913   loperamide (IMODIUM) capsule 2-4 mg  2-4 mg Oral PRN Oneta Rack, NP       LORazepam (ATIVAN) tablet 1 mg  1 mg Oral Q6H PRN Oneta Rack, NP       [START ON 08/17/2022] LORazepam (ATIVAN) tablet 1 mg  1 mg Oral BID Princess Bruins, DO  losartan (COZAAR) tablet 100 mg  100 mg Oral Daily White, Patrice L, NP   100 mg at 08/16/22 0915   magnesium hydroxide (MILK OF MAGNESIA) suspension 30 mL  30 mL Oral Daily PRN Oneta Rack, NP       metoprolol succinate (TOPROL-XL) 24 hr tablet 200 mg  200 mg Oral Daily White, Patrice L, NP   200 mg at 08/16/22 0175   multivitamin with minerals tablet 1 tablet  1 tablet Oral Daily Oneta Rack, NP   1 tablet at 08/16/22 0914   naltrexone (DEPADE) tablet 50 mg  50 mg Oral Daily Princess Bruins, DO       nicotine (NICODERM CQ - dosed in mg/24 hours) patch 21 mg  21 mg Transdermal Daily Oneta Rack, NP   21 mg at 08/16/22 0917   ondansetron (ZOFRAN-ODT) disintegrating tablet 4 mg  4 mg Oral Q6H PRN Oneta Rack, NP       PARoxetine (PAXIL) tablet 30 mg  30 mg Oral BID White, Patrice L, NP   30 mg at 08/16/22 0914   rosuvastatin (CRESTOR) tablet 40 mg  40 mg Oral Daily Nwoko, Uchenna E, PA   40 mg at 08/16/22 0915   thiamine (VITAMIN B1) tablet 100 mg  100 mg Oral Daily Oneta Rack, NP   100 mg at 08/16/22 0914   traZODone (DESYREL) tablet 50 mg  50 mg Oral QHS PRN Oneta Rack, NP       Current Outpatient Medications  Medication Sig Dispense Refill   busPIRone (BUSPAR) 30 MG tablet Take 1 tablet (30 mg total) by mouth 2 (two) times daily. 60 tablet 5   desmopressin (DDAVP) 0.2 MG tablet Take 200 mcg by mouth at bedtime.     hydrALAZINE (APRESOLINE) 10 MG tablet Take 10 mg by mouth 3 (three) times daily. Pt takes 0ne  10 mg tablet every morning and one 10mg  tablet at lunch and  two 10 mg  tablets at night     insulin glargine (LANTUS SOLOSTAR) 100 UNIT/ML Solostar Pen Inject 12 Units into the skin every morning. And pen needles 1/day 15 mL 11   latanoprost (XALATAN) 0.005 % ophthalmic solution Place 1 drop into both eyes at bedtime.     losartan (COZAAR) 100 MG tablet Take 100 mg by mouth daily.     [START ON 08/17/2022] fenofibrate 160 MG tablet Take 1 tablet (160 mg total) by  mouth daily. 30 tablet 0   [START ON 08/17/2022] metoprolol succinate (TOPROL-XL) 200 MG 24 hr tablet Take 1 tablet (200 mg total) by mouth daily. Take with or immediately following a meal. 30 tablet 0   naltrexone (DEPADE) 50 MG tablet Take 1 tablet (50 mg total) by mouth daily. 30 tablet 0   Omega-3 Fatty Acids (FISH OIL) 1000 MG CAPS Take 2 capsules (2,000 mg total) by mouth 2 (two) times daily. (Patient not taking: Reported on 08/15/2022)  0   PARoxetine (PAXIL) 30 MG tablet Take 1 tablet (30 mg total) by mouth 2 (two) times daily. 60 tablet 0   rosuvastatin (CRESTOR) 40 MG tablet Take 1 tablet (40 mg total) by mouth at bedtime. 30 tablet 0   [START ON 08/17/2022] thiamine (VITAMIN B-1) 100 MG tablet Take 1 tablet (100 mg total) by mouth daily. 30 tablet 0    PTA Medications: (Not in a hospital admission)      No data to display  [place] ED from 08/14/2022 in [facility] Most recent reading at 08/14/2022 11:37 PM ED from 08/14/2022 in [place] DEPT Most recent reading at 08/14/2022 11:43 AM  C-SSRS RISK CATEGORY No Risk No Risk       Musculoskeletal  Strength & Muscle Tone: within normal limits Gait & Station: normal Patient leans: N/A   Psychiatric Specialty Exam   Presentation  General Appearance:Appropriate for Environment, Casual, Fairly Groomed Eye Contact:Good Speech:Clear and Coherent, Normal Rate Volume:Normal Handedness:Right  Mood and Affect  Mood:Euthymic ("good") Affect:Appropriate, Congruent, Full Range  Thought Process  Thought Process:Coherent, Goal Directed, Linear Descriptions of Associations:Intact  Thought Content Suicidal Thoughts:Suicidal Thoughts: No Homicidal Thoughts:Homicidal Thoughts: No Hallucinations:Hallucinations: None Ideas of Reference:None Thought Content:Logical, WDL  Sensorium  Memory:Immediate Good Judgment:Fair Insight:Fair  Executive Functions   Orientation:Full (Time, Place and Person) Language:Good Concentration:Good [place] of Knowledge:Good  Psychomotor Activity  Psychomotor Activity:Psychomotor Activity: Normal  Assets  Assets:Communication Skills, Desire for Improvement  Sleep  Quality:Fair  Physical Exam  BP 135/86 (BP Location: Left Arm) Comment: Rechecked by RN  Pulse 77   Temp 97.9 F (36.6 C) (Oral)   Resp 18   SpO2 99%   Physical Exam Vitals and nursing note reviewed.  Constitutional:      General: He is not in acute distress.    Appearance: He is not ill-appearing, toxic-appearing or diaphoretic.  HENT:     Head: Normocephalic.  Pulmonary:     Effort: Pulmonary effort is normal. No respiratory distress.  Neurological:     Mental Status: He is alert.     Demographic Factors:  Male and Caucasian  Loss Factors: NA  Historical Factors: Impulsivity  Risk Reduction Factors:   Sense of responsibility to family, Religious beliefs about death, Employed, Living with another person, especially a relative, Positive social support, and Positive coping skills or problem solving skills  Continued Clinical Symptoms:  Alcohol/Substance Abuse/Dependencies Previous Psychiatric Diagnoses and Treatments  Cognitive Features That Contribute To Risk:  Loss of executive function    Suicide Risk:  Mild:  Suicidal ideation of limited frequency, intensity, duration, and specificity.  There are no identifiable plans, no associated intent, mild dysphoria and related symptoms, good self-control (both objective and subjective assessment), few other risk factors, and identifiable protective factors, including available and accessible social support.  Plan Of Care/Follow-up recommendations:  Activity and diet at tolerated.  Please: Take all medications as prescribed by your mental healthcare provider. Report any adverse effects and or reactions from the medicines to your outpatient  provider promptly. Do not engage in alcohol and or illegal drug use while on prescription medicines.  Disposition: home with wife, wife picking up   Total Time spent with patient: 30 minutes  Signed: Merrily Brittle, DO Psychiatry Resident, PGY-2 [facility] 08/17/2022

## 2022-08-16 NOTE — Discharge Instructions (Addendum)
Dear Oscar Ruiz,  Most effective treatment for your mental health disease involves BOTH a psychiatrist AND a therapist Psychiatrist to manage medications Therapist to help identify personal goals, barriers from those goals, and plan to achieve those goals by understanding emotions Please make regular appointments with an outpatient psychiatrist and other doctors once you leave the hospital (if any, otherwise, please see below for resources to make an appointment).  For therapy outside the hospital, please ask for these specific types of therapy: DBT ________________________________________________________  SAFETY CRISIS  Dial 988 for [place]    Text [phone] for Crisis Text Line:     [facility]:  [address] FIRST FLOOR.  [place], [place] [address].  [phone]  Mobile Crisis Response Teams Listed by counties in vicinity of [place]. [phone] [place] [phone] [place] [phone] [facility] [phone] [place] [phone] [place]. [phone] [place].  [place] [phone] ________________________________________________________  To see which pharmacy near you is the CHEAPEST for certain medications, please use GoodRx. It is free website and has a free phone app.    Also consider looking at [facility] $4.00 or [facility]'s $7.00 prescription list. Both are free to view if googled "walmart $4 prescription" and "public's $7 prescription". These are set prices, no insurance required. [facility]'s low cost medications: $4-$15 for 30days  prescriptions or $10-$38 for 90days prescriptions  ________________________________________________________  Difficulties with sleep?   Can also use this free app for insomnia called CBT-I. Let your doctors and therapists know so they can help with extra tips and tricks or for guidance and accountability. NO ADDS on the app.     ________________________________________________________  Non-Emergent / Urgent  [facility] [address], [place],  [address] [phone] OUTPATIENT Walk-in information: Please note, all walk-ins are first come & first serve, with limited number of availability.  Please note that to be eligible for services you must bring: ID or a piece of mail with your name [facility] address  Therapist for therapy:  Monday & Wednesdays: Please ARRIVE at 7:15 AM for registration Will START at 8:00 AM Every 1st & 2nd Friday of the month: Please ARRIVE at 10:15 AM for registration Will START at 1 PM - 5 PM  Psychiatrist for medication management: Monday - Friday:  Please ARRIVE at 7:15 AM for registration Will START at 8:00 AM  Regretfully, due to limited availability, please be aware that you may not been seen on the same day as walk-in. Please consider making an appoint or try again. Thank you for your patience and understanding.   It is imperative that you follow through with treatment recommendations within 5-7 days from the day of discharge to mitigate further risk to your safety and overall mental well-being.  A list of outpatient therapy and psychiatric providers for medication management has been provided below to get you started in finding the right provider for you.            [facility] [address] [place], [place], [address] [phone] phone (Medicare, Private  insurance except Tricare, Blue [place], and [facility])  [facility] [address] [place], [place], [address] [address]5Th [address] phone ([address] AmeriHealth Caritas - [place], [address], [place], [place], Friday Health Plans, [address], BCBS Healthy [place], [place], [address], [place], [place], [place], [place], Tricare, [facility], Safeco Corporation, [place])  [employer] [phone] [address], Suite [place], [place], [address] [phone] phone [phone] phone [phone] fax  [facility] [address] [place], [place], [address] [phone] phone (Call to confirm insurance coverage) Consultation & Support Services     o Drop-In Hours: 1:00 PM to 5:00 PM     o Days: Monday - Thursday  Crisis Services (24/7)   [facility] [address] [place], [place], [address] [phone] phone ([address] [place], [place], [place] Medicaid, [place] and [place], [facility])      [facility] [address], [address] [place], [place], [address] [phone] phone [email or web address]  (to complete the intake form and upload ID and insurance cards)  [facility] [address], [address] [place], [place], [address] [phone] phone ([address], [address], [place], [address] Calpine Corporation, [place], [place], [place], [facility], [place], and certain Medicaid plans)  [facility] [phone] N. [address] [place], [place], [address] [phone] phone [phone] fax (Medicaid, Medicare, Self-pay, call about other insurance coverage)  [facility] (age 48+) [address] [place], [place], [address] [phone] hone [phone] fax ([place], [address], [place], [place], [place], [address], [place], [place], [place], [place], certain Ryland Group, [facility], UMR)  [facility] [address], [place], [address] [phone] phone (Medicare, Medicaid, Artemio Aly, call about other insurance  coverage)  [facility] [address] [place], [place], [address] [phone] phone [phone] fax (Call [phone] to see what insurance is accepted) Archer Asa, MD specializes in geropsych)  [facility]  (medication management only) [address] [place], [place], [address] [phone] phone [phone] fax ([address] Medicaid, [place], [place], Ernest, [place], [place], [place], [place])  [facility] (medication management only) [address] [place], [place], [address] [phone] phone [phone] fax ([address] Medicare, [place], [place], Tricare [place])  [facility] [address] [place], [place], [address] [phone] phone [phone] fax ([address], [place], [place], [place], [place], [facility], [facility] Medicaid/[place] Health Choice)  [facility] [place], Avnet. [address] [place], [place], [address] [phone] phone [phone] fax (Medicare, Medicaid, [facility])  [facility] [address] [place], [place] [address] [phone] phone ([address] [place], [place], [place], Corning, Medicare, [place], [facility]) Does genetic testing for medications; does transcranial magnetic stimulation along with basic services)  [facility] [address] [place], [place], [address] [phone] phone (Call about insurance coverage)  [facility] [address] [place], [place], [address] [phone] phone [phone] fax (Call about insurance coverage)  [facility] [address] [place], [place], [address] [phone] phone [phone] fax (Call about insurance coverage)  [employer] [phone] N[address] [place], [address] [phone] phone (Medicaid, Tricare, [place], [place], [place])  [employer] [address] Ginette [place], [place], [address] [phone] phone (Medicaid, Medicare, call  about other insurance coverage)  [facility] [address] [place], [place], [address] [phone] phone [phone] fax (Medicaid, Medicare, Tricare, call about other insurance coverage)  [facility] [address] [place], [place], [address] [phone] phone ([address] [place], [place],  MedCost, Tricare, Medicaid types - Alliance, AmeriHealth, Partners, [place], [place] Health Choice, Healthy [place], [place], [place], and Complete)  [facility] [phone] [address], [address] [place], [place], [address] [phone] phone Completely online treatment platform Contact: [place] [facility] Specialist [phone] phone [phone] fax ([address] [place], [place], Friday Health Plan, [place], [place], [employer], [place], [place], [place])  It is imperative that you follow through with treatment recommendations within 5-7 days from the day of discharge to mitigate further risk to your safety and overall mental well-being.  A list of outpatient therapy and psychiatric providers for medication management has been provided below to get you started in finding the right provider for you.           [facility] [address] [place], [place], [address] [phone] phone (BCBS, [place]; for individuals without insurance, please call and speak to our staff)  [facility] [address] [place], [place], [address] [address]5Th [address] phone ([address], AmeriHealth [address] - [place], [address], [place], [place], Friday Health Plans, [address], BCBS Healthy [place], [place], [address], [place], [place], [place], [place], [place], [facility], [facility] Federal-Mogul, Eli Lilly and Company)  [employer], [facility] [address] [place], [place], [address] [phone] phone (Aetna/Aetna [place], BHS/Behavioral Health Systems, [place], [place], [address], [place], ComPsych, Health Choice,  Health Net, [place], MHN/Health Net, [place], [place], [place])  [facility] [address] [place], [place], [address] [phone] phone - MUST CALL TO COMPLETE A PHONE ASSESSMENT [phone] fax PHP - 9am-3pm, 5 days/week (in-person); IOP - 9am-12pm, 3 days/week (virtual; you choose the days) (BCBS, [place], [place], [place], [place], [place])  [facility] [address], [place], [address] [phone] phone [phone] fax (Medicare, call to verify other insurance coverage)  [facility] [address], [address] [place], [place], [address] [phone] phone [phone] fax Also does DBT, Trauma Focused CBT, Trauma Groups, Support Groups (BCBS, self-pay, or scholarship)  [facility] [address] [place], [place], [address] [phone] phone Also does DBT, Radical Open DBT, EMDR, and [place] ([address], UHC, [place], Berneta Levins, Cigna)  [facility] [address], [place], [address]  [phone] phone [phone] fax Also does EMDR, Child First, and trauma work (Medicaid, [place], [facility], [place], [place] Health Choice, reduced rates for self-pay)  [employer] (DBT Center) [address] [place], [place], [address] [phone] phone [phone] fax  [facility] (ADULTS ONLY) [address] [place], [place], [address] [phone] phone (BCBS-[place], [place], [place], [facility], Friday Health, [facility])  [facility] (FEMALES ONLY; FAITH BASED) [address] [place], [place], [address] [phone] phone (Sliding fee scale, [place], [facility], [place], Alliance)  [facility] [address] [place], [place], [address] [phone] phone (Medicaid - [place], [place], and Innovations; [place], [facility], Becton, Dickinson and Company, [place], [place], [place], [address] Preferred, Tricare/[place], [place])  [facility] [address]   [address] [place], [place], [address]   [place], [place], [address] [phone] phone    [phone] phone [phone] fax    [phone] fax (Call to verify insurance coverage)  [facility] [phone] N[address], [address] [place], [place], [address] [phone] phone [phone] fax Also does [address], [address], Neuropsychiatric Testing ([address], [address]6Th [address], [address], [place], [place], Calpine Corporation, Teacher, adult education by Charter Communications of [place], [place], [facility] Health Work, Aflac Incorporated, Progress Energy, [place], MHN, Capital One, [place], Avon)  [facility] [address] [place], [place], [address] [phone] phone Also does DBT and Addiction (BCBS, [place], [place], and self-pay)  [employer]  (Valero Energy ONLY) [address] [place], [place], [address] [phone] phone ([address], [address] [place], [place], Sprint Nextel Corporation, [place], Harrah's Entertainment, Advanced Micro Devices, Tricare, [place], [facility])  [facility] [address], [address] [place], [place], [address] [phone] phone (Call to verify insurance coverage)  [facility] [address]   [address] [place], [place], [address]  [place], [place], [address] [phone] phone   [phone] phone (Call to verify insurance coverage)  [facility] [address] [place], [place] [address] [phone]  [facility] [address] [place], [place] [address] [phone]

## 2022-08-16 NOTE — ED Notes (Signed)
Pt sitting up in cafeteria eating lunch in no acute distress. Denies concerns at present. Informed pt to notify staff with any needs or concerns. Will continue to monitor for safety.

## 2022-08-16 NOTE — ED Notes (Signed)
Patient A&Ox4. Denies intent to harm self/others when asked. Denies A/VH. Patient denies any physical complaints when asked. No acute distress noted. Routine safety checks conducted according to facility protocol. Encouraged patient to notify staff if thoughts of harm toward self or others arise. Patient verbalize understanding and agreement. Will continue to monitor for safety.    

## 2022-08-16 NOTE — ED Notes (Signed)
Pt calm, cooperative on unit. Received scheduled medication without difficulty. No acute distress noted. Informed pt to notify staff with any needs or concerns. Will continue to monitor for safety.

## 2022-08-16 NOTE — ED Notes (Signed)
Pt is in the bed sleeping. Respiration are even and unlabored. No acute distress noted. Will continue to monitor for safety. 

## 2022-08-16 NOTE — ED Notes (Signed)
Pt asleep in bed. Respirations even and unlabored. Monitoring for safety. 

## 2022-08-16 NOTE — ED Provider Notes (Signed)
Behavioral Health Progress Note  Date and Time: 08/16/2022 9:46 AM Name: Oscar Ruiz MRN:  [id number]  Oscar Ruiz is a 46 y.o. male with PMH of AUD, tobacco use d/o, GAD, DM2, HLD, HTN who presented to [facility] (08/14/2022) with wife for admission to [facility] for EtOH detox.  Subjective: Patient was seen this AM, initially asleep, awoke easily, no acute distress. Patient denied withdrawal sxs and cravings at this time. Stated that he feels "good", with stable and appropriate sleep and appetite. Stated that his goals are to be able to go back home to resume work. Declined residential and PHP/IOP due to wanting to resume work. Declined need for work note. He inquired about his BP, stated that currently is it stable currently. Discussed importance of him continuing to follow-up with his PCP. He had no other questions or concerns.   [id number] Thoughts: No [id number] Thoughts: No [id number]: None Ideas of [id number]   Mood: Euthymic ("good") Sleep:Fair Appetite:Good   HPI: Per chart review: Oscar Ruiz is a 46 year old Caucasian male who presented to [facility] urgent care accompanied by his wife seeking detox treatment. States he drinks 12-18 beers daily. States due to his declining health he would like to get his alcohol abuse under wraps. Reports previous inpatient facility for substance abuse treatment with opiates and alcohol. Currently he is denying suicidal or homicidal ideations. Denies auditory visual hallucinations. Reports mild frustration related to his waiting in the emergency department without being treated. Chart review patient was discharged on a Librium taper with additional outpatient resources. Patient was initially reluctant to stay for treatment. States his main concerns as hypertension, anxiety and some depression.    Diagnosis:  Final diagnoses:  Alcohol abuse    Total Time spent with patient: 30 minutes  Past Psychiatric History: History os  substance abuse, alcohol dependence, panic attacks and anxiety.  Past Medical History:  Past Medical History:  Diagnosis Date   Anxiety    Diabetes mellitus without complication ([place])    Hypertension    Seizures ([place])    Substance abuse ([place])    tobacco and alcohol    Past Surgical History:  Procedure Laterality Date   HERNIA REPAIR     Family History:  Family History  Problem Relation Age of Onset   Diabetes Mother    Alcohol abuse Father    Hyperlipidemia Father    Coronary artery disease Father    Heart failure Father    Family Psychiatric  History: Family history of alcohol abuse, father, (P) grandmother and (P) aunt.  Social History:  Social History   Substance and Sexual Activity  Alcohol Use Yes   Alcohol/week: 36.0 standard drinks of alcohol   Types: 36 Cans of beer per week   Comment: was in remission for 2 years in rehab 2017     Social History   Substance and Sexual Activity  Drug Use No    Social History   Socioeconomic History   Marital status: Married    Spouse name: Not on file   Number of children: 2   Years of education: Not on file   Highest education level: Not on file  Occupational History   Occupation: Conservation officer, historic buildings: SELF  Tobacco Use   Smoking status: Every Day    Packs/day: 2.00    Types: Cigarettes   Smokeless tobacco: Former  Scientific laboratory technician Use: Never used  Substance and Sexual Activity   Alcohol use: Yes  Alcohol/week: 36.0 standard drinks of alcohol    Types: 36 Cans of beer per week    Comment: was in remission for 2 years in rehab 2017   Drug use: No   Sexual activity: Yes    Birth control/protection: None  Other Topics Concern   Not on file  Social History Narrative   Regular exercise-yes   Diet: Fast food, skips meals   Social Determinants of Health   Financial Resource Strain: Not on file  Food Insecurity: Not on file  Transportation Needs: Not on file  Physical Activity: Not on file  Stress:  Not on file  Social Connections: Not on file   SDOH:  SDOH Screenings   Tobacco Use: High Risk (08/15/2022)   Additional Social History:    Pain Medications: See MAR Prescriptions: See MAR Over the Counter: See MAR History of alcohol / drug use?: Yes Longest period of sobriety (when/how long): 6 months Negative Consequences of Use: Personal relationships Withdrawal Symptoms: Agitation, Sweats, Weakness Name of Substance 1: Alcohol 1 - Age of First Use: 18 1 - Amount (size/oz): Pt reports he had been drinking up to 24 12 oz beers daily until the last week when he has cut back to 8 to 10 beers 1 - Frequency: Daily 1 - Duration: Last year 1 - Last Use / Amount: Last night 9/22 when he reported he had "a few beers" 1 - Method of Aquiring: NA 1- Route of Use: Oral    Current Medications:  Current Facility-Administered Medications  Medication Dose Route Frequency Provider Last Rate Last Admin   acetaminophen (TYLENOL) tablet 650 mg  650 mg Oral Q6H PRN Derrill Center, NP       alum & mag hydroxide-simeth (MAALOX/MYLANTA) 200-200-20 MG/5ML suspension 30 mL  30 mL Oral Q4H PRN Derrill Center, NP       busPIRone (BUSPAR) tablet 30 mg  30 mg Oral BID Nwoko, Uchenna E, PA   30 mg at 08/16/22 0914   desmopressin (DDAVP) tablet 200 mcg  200 mcg Oral QHS Nwoko, Uchenna E, PA   200 mcg at 08/15/22 2115   fenofibrate tablet 160 mg  160 mg Oral Daily Nwoko, Uchenna E, PA   160 mg at 08/16/22 0915   hydrALAZINE (APRESOLINE) tablet 10 mg  10 mg Oral BID BM Nwoko, Uchenna E, PA   10 mg at 08/16/22 0800   hydrALAZINE (APRESOLINE) tablet 20 mg  20 mg Oral QHS Nwoko, Uchenna E, PA   20 mg at 08/15/22 2110   hydrOXYzine (ATARAX) tablet 25 mg  25 mg Oral TID PRN Derrill Center, NP       insulin aspart (novoLOG) injection 0-5 Units  0-5 Units Subcutaneous QHS Lewis, Tanika N, NP       insulin aspart (novoLOG) injection 0-9 Units  0-9 Units Subcutaneous TID WC Derrill Center, NP   1 Units at 08/15/22  1730   insulin glargine-yfgn (SEMGLEE) injection 11 Units  11 Units Subcutaneous Daily Hampton Abbot, MD   11 Units at 08/16/22 0913   loperamide (IMODIUM) capsule 2-4 mg  2-4 mg Oral PRN Derrill Center, NP       LORazepam (ATIVAN) tablet 1 mg  1 mg Oral Q6H PRN Derrill Center, NP       LORazepam (ATIVAN) tablet 1 mg  1 mg Oral TID Derrill Center, NP   1 mg at 08/16/22 0915   Followed by   Derrill Memo ON 08/17/2022] LORazepam (ATIVAN) tablet  1 mg  1 mg Oral BID Derrill Center, NP       Followed by   Derrill Memo ON 08/18/2022] LORazepam (ATIVAN) tablet 1 mg  1 mg Oral Daily Derrill Center, NP       losartan (COZAAR) tablet 100 mg  100 mg Oral Daily White, Patrice L, NP   100 mg at 08/16/22 0915   magnesium hydroxide (MILK OF MAGNESIA) suspension 30 mL  30 mL Oral Daily PRN Derrill Center, NP       metoprolol succinate (TOPROL-XL) 24 hr tablet 200 mg  200 mg Oral Daily White, Patrice L, NP   200 mg at 08/16/22 [id number]   multivitamin with minerals tablet 1 tablet  1 tablet Oral Daily Derrill Center, NP   1 tablet at 08/16/22 [id number]   nicotine (NICODERM CQ - dosed in mg/24 hours) patch 21 mg  21 mg Transdermal Daily Derrill Center, NP   21 mg at 08/16/22 0917   ondansetron (ZOFRAN-ODT) disintegrating tablet 4 mg  4 mg Oral Q6H PRN Derrill Center, NP       PARoxetine (PAXIL) tablet 30 mg  30 mg Oral BID White, Patrice L, NP   30 mg at 08/16/22 0914   rosuvastatin (CRESTOR) tablet 40 mg  40 mg Oral Daily Nwoko, Uchenna E, PA   40 mg at 08/16/22 0915   thiamine (VITAMIN B1) tablet 100 mg  100 mg Oral Daily Derrill Center, NP   100 mg at 08/16/22 0914   traZODone (DESYREL) tablet 50 mg  50 mg Oral QHS PRN Derrill Center, NP       Current Outpatient Medications  Medication Sig Dispense Refill   busPIRone (BUSPAR) 30 MG tablet Take 1 tablet (30 mg total) by mouth 2 (two) times daily. 60 tablet 5   desmopressin (DDAVP) 0.2 MG tablet Take 200 mcg by mouth at bedtime.     fenofibrate (TRICOR) 145 MG tablet  Take 1 tablet (145 mg total) by mouth daily. (Patient taking differently: Take 145 mg by mouth at bedtime.) 30 tablet 11   hydrALAZINE (APRESOLINE) 10 MG tablet Take 10 mg by mouth 3 (three) times daily. Pt takes 0ne  10 mg tablet every morning and one 10mg  tablet at lunch and  two 10 mg  tablets at night     insulin glargine (LANTUS SOLOSTAR) 100 UNIT/ML Solostar Pen Inject 12 Units into the skin every morning. And pen needles 1/day 15 mL 11   latanoprost (XALATAN) 0.005 % ophthalmic solution Place 1 drop into both eyes at bedtime.     losartan (COZAAR) 100 MG tablet Take 100 mg by mouth daily.     metoprolol (TOPROL-XL) 200 MG 24 hr tablet Take 200 mg by mouth daily. Pt takes one 200 mg tablet  Along with one 100 mg tablet for a total of 300 mg every morning     metoprolol succinate (TOPROL-XL) 100 MG 24 hr tablet Take 1 tablet (100 mg total) by mouth daily. Take with or immediately following a meal. (Patient taking differently: Take 100 mg by mouth daily. Take with or immediately following a meal. Pt takes one 100 mg tablet  along with one 200 mg  tablet for a total of 300 mg every morning) 30 tablet 11   PARoxetine (PAXIL) 30 MG tablet Take 2 tablets (60 mg total) by mouth daily. 60 tablet 5   rosuvastatin (CRESTOR) 40 MG tablet Take 1 tablet (40 mg total) by mouth daily. (Patient  taking differently: Take 40 mg by mouth at bedtime.) 90 tablet 3   chlordiazePOXIDE (LIBRIUM) 25 MG capsule 50mg  PO TID x 1D, then 25-50mg  PO BID X 1D, then 25-50mg  PO QD X 1D (Patient not taking: Reported on 08/15/2022) 10 capsule 0   gabapentin (NEURONTIN) 600 MG tablet Take 1 capsules by mouth every morning, 1 capsule at noon, and take 2 capsules at bedtime. (Patient not taking: Reported on 08/15/2022) 120 tablet 5   HYDROcodone-acetaminophen (NORCO/VICODIN) 5-325 MG tablet Take 1 tablet by mouth every 6 (six) hours as needed. (Patient not taking: Reported on 08/15/2022) 14 tablet 0   Omega-3 Fatty Acids (FISH OIL) 1000 MG  CAPS Take 2 capsules (2,000 mg total) by mouth 2 (two) times daily. (Patient not taking: Reported on 08/15/2022)  0    Labs  Lab Results:  Admission on 08/14/2022  Component Date Value Ref Range Status   SARS Coronavirus 2 by RT PCR 08/14/2022 NEGATIVE  NEGATIVE Final   Comment: (NOTE) SARS-CoV-2 target nucleic acids are NOT DETECTED.  The SARS-CoV-2 RNA is generally detectable in upper respiratory specimens during the acute phase of infection. The lowest concentration of SARS-CoV-2 viral copies this assay can detect is 138 copies/mL. A negative result does not preclude SARS-Cov-2 infection and should not be used as the sole basis for treatment or other patient management decisions. A negative result may occur with  improper specimen collection/handling, submission of specimen other than nasopharyngeal swab, presence of viral mutation(s) within the areas targeted by this assay, and inadequate number of viral copies(<138 copies/mL). A negative result must be combined with clinical observations, patient history, and epidemiological information. The expected result is Negative.  Fact Sheet for Patients:  EntrepreneurPulse.com.au  Fact Sheet for Healthcare Providers:  IncredibleEmployment.be  This test is no                          t yet approved or cleared by the [place] FDA and  has been authorized for detection and/or diagnosis of SARS-CoV-2 by FDA under an Emergency Use Authorization (EUA). This EUA will remain  in effect (meaning this test can be used) for the duration of the COVID-19 declaration under Section 564(b)(1) of the Act, 21 U.S.C.section 360bbb-3(b)(1), unless the authorization is terminated  or revoked sooner.       Influenza A by PCR 08/14/2022 NEGATIVE  NEGATIVE Final   Influenza B by PCR 08/14/2022 NEGATIVE  NEGATIVE Final   Comment: (NOTE) The Xpert Xpress SARS-CoV-2/FLU/RSV plus assay is intended as an aid in the  diagnosis of influenza from Nasopharyngeal swab specimens and should not be used as a sole basis for treatment. Nasal washings and aspirates are unacceptable for Xpert Xpress SARS-CoV-2/FLU/RSV testing.  Fact Sheet for Patients: EntrepreneurPulse.com.au  Fact Sheet for Healthcare Providers: IncredibleEmployment.be  This test is not yet approved or cleared by the [place] FDA and has been authorized for detection and/or diagnosis of SARS-CoV-2 by FDA under an Emergency Use Authorization (EUA). This EUA will remain in effect (meaning this test can be used) for the duration of the COVID-19 declaration under Section 564(b)(1) of the Act, 21 U.S.C. section 360bbb-3(b)(1), unless the authorization is terminated or revoked.  Performed at [facility], [place] [address], [place], [place] [address]    POC Amphetamine UR 08/14/2022 None Detected  NONE DETECTED (Cut Off Level 1000 ng/mL) Final   POC Secobarbital (BAR) 08/14/2022 None Detected  NONE DETECTED (Cut Off Level 300  ng/mL) Final   POC Buprenorphine (BUP) 08/14/2022 None Detected  NONE DETECTED (Cut Off Level 10 ng/mL) Final   POC Oxazepam (BZO) 08/14/2022 None Detected  NONE DETECTED (Cut Off Level 300 ng/mL) Final   POC Cocaine UR 08/14/2022 None Detected  NONE DETECTED (Cut Off Level 300 ng/mL) Final   POC Methamphetamine UR 08/14/2022 None Detected  NONE DETECTED (Cut Off Level 1000 ng/mL) Final   POC Morphine 08/14/2022 None Detected  NONE DETECTED (Cut Off Level 300 ng/mL) Final   POC Methadone UR 08/14/2022 None Detected  NONE DETECTED (Cut Off Level 300 ng/mL) Final   POC Oxycodone UR 08/14/2022 None Detected  NONE DETECTED (Cut Off Level 100 ng/mL) Final   POC Marijuana UR 08/14/2022 None Detected  NONE DETECTED (Cut Off Level 50 ng/mL) Final   Hgb A1c MFr Bld 08/14/2022 6.7 (H)  4.8 - 5.6 % Final   Comment: (NOTE) Pre diabetes:          5.7%-6.4%  Diabetes:               >6.4%  Glycemic control for   <7.0% adults with diabetes    Mean Plasma Glucose 08/14/2022 145.59  mg/dL Final   Performed at [facility], [place] [address], [place], [place] [address]   SARSCOV2ONAVIRUS 2 AG 08/14/2022 NEGATIVE  NEGATIVE Final   Comment: (NOTE) SARS-CoV-2 antigen NOT DETECTED.   Negative results are presumptive.  Negative results do not preclude SARS-CoV-2 infection and should not be used as the sole basis for treatment or other patient management decisions, including infection  control decisions, particularly in the presence of clinical signs and  symptoms consistent with COVID-19, or in those who have been in contact with the virus.  Negative results must be combined with clinical observations, patient history, and epidemiological information. The expected result is Negative.  Fact Sheet for Patients: HandmadeRecipes.com.cy  Fact Sheet for Healthcare Providers: FuneralLife.at  This test is not yet approved or cleared by the [place] FDA and  has been authorized for detection and/or diagnosis of SARS-CoV-2 by FDA under an Emergency Use Authorization (EUA).  This EUA will remain in effect (meaning this test can be used) for the duration of  the COV                          ID-19 declaration under Section 564(b)(1) of the Act, 21 U.S.C. section 360bbb-3(b)(1), unless the authorization is terminated or revoked sooner.     Glucose-Capillary 08/14/2022 65 (L)  70 - 99 mg/dL Final   Glucose reference range applies only to samples taken after fasting for at least 8 hours.   Glucose-Capillary 08/14/2022 175 (H)  70 - 99 mg/dL Final   Glucose reference range applies only to samples taken after fasting for at least 8 hours.   Glucose-Capillary 08/15/2022 88  70 - 99 mg/dL Final   Glucose reference range applies only to samples taken after fasting for at least 8 hours.   Glucose-Capillary 08/15/2022 225 (H)  70 -  99 mg/dL Final   Glucose reference range applies only to samples taken after fasting for at least 8 hours.   Glucose-Capillary 08/15/2022 124 (H)  70 - 99 mg/dL Final   Glucose reference range applies only to samples taken after fasting for at least 8 hours.   Glucose-Capillary 08/15/2022 169 (H)  70 - 99 mg/dL Final   Glucose reference range applies only to samples taken after fasting for  at least 8 hours.   Glucose-Capillary 08/16/2022 151 (H)  70 - 99 mg/dL Final   Glucose reference range applies only to samples taken after fasting for at least 8 hours.  Admission on 08/14/2022, Discharged on 08/14/2022  Component Date Value Ref Range Status   Sodium 08/14/2022 133 (L)  135 - 145 mmol/L Final   Potassium 08/14/2022 4.0  3.5 - 5.1 mmol/L Final   Chloride 08/14/2022 100  98 - 111 mmol/L Final   CO2 08/14/2022 23  22 - 32 mmol/L Final   Glucose, Bld 08/14/2022 111 (H)  70 - 99 mg/dL Final   Glucose reference range applies only to samples taken after fasting for at least 8 hours.   BUN 08/14/2022 12  6 - 20 mg/dL Final   Creatinine, Ser 08/14/2022 1.18  0.61 - 1.24 mg/dL Final   Calcium 08/14/2022 9.8  8.9 - 10.3 mg/dL Final   Total Protein 08/14/2022 7.8  6.5 - 8.1 g/dL Final   Albumin 08/14/2022 4.7  3.5 - 5.0 g/dL Final   AST 08/14/2022 51 (H)  15 - 41 U/L Final   ALT 08/14/2022 35  0 - 44 U/L Final   Alkaline Phosphatase 08/14/2022 37 (L)  38 - 126 U/L Final   Total Bilirubin 08/14/2022 0.9  0.3 - 1.2 mg/dL Final   GFR, Estimated 08/14/2022 >60  >60 mL/min Final   Comment: (NOTE) Calculated using the CKD-EPI Creatinine Equation (2021)    Anion gap 08/14/2022 10  5 - 15 Final   Performed at [facility], [place] [address], [place], [place] [address]   WBC 08/14/2022 9.4  4.0 - 10.5 K/uL Final   RBC 08/14/2022 5.53  4.22 - 5.81 MIL/uL Final   Hemoglobin 08/14/2022 17.7 (H)  13.0 - 17.0 g/dL Final   HCT 08/14/2022 49.0  39.0 - 52.0 % Final   MCV 08/14/2022 88.6   80.0 - 100.0 fL Final   MCH 08/14/2022 32.0  26.0 - 34.0 pg Final   MCHC 08/14/2022 36.1 (H)  30.0 - 36.0 g/dL Final   RDW 08/14/2022 11.8  11.5 - 15.5 % Final   Platelets 08/14/2022 176  150 - 400 K/uL Final   nRBC 08/14/2022 0.0  0.0 - 0.2 % Final   Performed at [facility], [place] [address], [place], [place] [address]   Opiates 08/14/2022 NONE DETECTED  NONE DETECTED Final   Cocaine 08/14/2022 NONE DETECTED  NONE DETECTED Final   Benzodiazepines 08/14/2022 NONE DETECTED  NONE DETECTED Final   Amphetamines 08/14/2022 NONE DETECTED  NONE DETECTED Final   Tetrahydrocannabinol 08/14/2022 NONE DETECTED  NONE DETECTED Final   Barbiturates 08/14/2022 NONE DETECTED  NONE DETECTED Final   Comment: (NOTE) DRUG SCREEN FOR MEDICAL PURPOSES ONLY.  IF CONFIRMATION IS NEEDED FOR ANY PURPOSE, NOTIFY LAB WITHIN 5 DAYS.  LOWEST DETECTABLE LIMITS FOR URINE DRUG SCREEN Drug Class                     Cutoff (ng/mL) Amphetamine and metabolites    1000 Barbiturate and metabolites    200 Benzodiazepine                 426 Tricyclics and metabolites     300 Opiates and metabolites        300 Cocaine and metabolites        300 THC  50 Performed at [facility], [address] [place], [place] [id number]     Blood Alcohol level:  Lab Results  Component Value Date   [facility]    Metabolic Disorder Labs: Lab Results  Component Value Date   HGBA1C 6.7 (H) 08/14/2022   MPG 145.59 08/14/2022   No results found for: "PROLACTIN" Lab Results  Component Value Date   CHOL 440 (H) 10/31/2019   TRIG 3,788 (HH) 10/31/2019   HDL 10 (L) 10/31/2019   CHOLHDL 44.0 (H) 10/31/2019   VLDL 49.0 (H) 03/14/2014   LDLCALC Comment (A) 10/31/2019   LDLCALC Comment 02/21/2017    Therapeutic Lab Levels: No results found for: "LITHIUM" No results found for: "VALPROATE" No results found for: "CBMZ"  Physical Findings   AUDIT     Flowsheet Row [facility]  Alcohol Use Disorder Identification Test Final Score (AUDIT) 23      Flowsheet Row [facility] Most recent reading at 08/14/2022 11:37 PM ED [facility]20[facility] Most recent reading at 08/14/2022 11:43 AM  C-SSRS RISK CATEGORY No Risk No Risk        Musculoskeletal  Strength & Muscle Tone: within normal limits Gait & Station: normal Patient leans: N/A  Psychiatric Specialty Exam  Presentation  General Appearance: Appropriate for Environment; Casual; Fairly Groomed  Eye Contact:Good  Speech:Clear and Coherent; Normal Rate  Speech Volume:Normal  Handedness:Right   Mood and Affect  Mood:Euthymic ("good")  Affect:Appropriate; Congruent; Full Range   Thought Process  Thought Processes:Coherent; Goal Directed; Linear  Descriptions of Associations:Intact  Orientation:Full (Time, Place and Person)  Thought Content:Logical; WDL  Diagnosis of Schizophrenia or Schizoaffective disorder in past: No    Hallucinations:Hallucinations: None  Ideas of Reference:None  Suicidal Thoughts:Suicidal Thoughts: No  Homicidal Thoughts:Homicidal Thoughts: No   Sensorium  Memory:Immediate Good  Judgment:Fair  Insight:Fair   Executive Functions  Concentration:Good  Attention Span:Good  Recall:Good  Fund of Knowledge:Good  Language:Good   Psychomotor Activity  Psychomotor Activity:Psychomotor Activity: Normal   Assets  Assets:Communication Skills; Desire for Improvement   Sleep  Sleep:Sleep: Fair  Physical Exam  Physical Exam HENT:     Head: Normocephalic.     Nose: Nose normal.  Eyes:     Conjunctiva/sclera: Conjunctivae normal.  Cardiovascular:     Rate and Rhythm: Normal rate.  Musculoskeletal:        General: Normal range of motion.     Cervical back:  Normal range of motion.  Neurological:     Mental Status: He is alert and oriented to person, place, and time.    Review of Systems  Constitutional: Negative.   HENT: Negative.    Eyes: Negative.   Respiratory: Negative.    Cardiovascular: Negative.   Gastrointestinal: Negative.   Genitourinary: Negative.   Musculoskeletal: Negative.   Skin: Negative.   Neurological: Negative.   Endo/Heme/Allergies: Negative.    Blood pressure 135/86, pulse 77, temperature 97.9 F (36.6 C), temperature source Oral, resp. rate 18, SpO2 99 %. There is no height or weight on file to calculate BMI.  Treatment Plan Summary: [facility] for mood stabilization and alcohol detox. Patient is voluntary.    AUD Continue Ativan 1 mg po taper for AUD (ends 9/27) Discuss with patient starting naltrexone for AUD  CIWA per protocol with thiamine, MV  Anxiety Continue  Paxil 30 mg BID-per pt request, how he currently takes medication at home for anxiety  Continue hydroxyzine 25 mg po TID for anxiety as needed  Continue buspar 30 mg po BID for anxiety   Tobacco use d/o NRT Encouraged cessation  HTN Continued the losartan 100 mg p.o. daily  Continued metoprolol 200 mg daily and continue to monitor blood pressure. Goal is to titrate to home dose of 300 mg if appropriate  Continue Hydralazine 10 mg p.o. twice daily and 20 mg at bedtime.  Diabetes Desmopressin 253mcg Insulin with CBGs  HLD Atorvastatin 40mg  daily  DISPO: Tentative Date: 08/18/2022 Location: Home with family  Merrily Brittle, DO 08/16/2022 9:46 AM

## 2022-08-17 DIAGNOSIS — F1010 Alcohol abuse, uncomplicated: Secondary | ICD-10-CM

## 2022-08-17 LAB — GLUCOSE, CAPILLARY: Glucose-Capillary: 160 mg/dL — ABNORMAL HIGH (ref 70–99)

## 2022-08-17 NOTE — ED Notes (Signed)
Pt is in the bed sleeping. Respirations are even and unlabored. No acute distress noted. Will continue to monitor for safety. 

## 2022-08-17 NOTE — BH Assessment (Signed)
Group Session   LCSW facilitated group session to provide psycho-education on the brain's amygdala and stress response when faced with either threatening or overwhelming situation; discussed and demonstrated mindfulness skills used to regulate overstimulation; discussion on effective communication, specifically with taking personal accountability and recognizing when actions have an impact on others.   Pt presented on time for the group.  Pt presented as being in a neutral mood with a congruent affect.  Pt appeared to be attentive and contributed well to the group's discussion as he asked probing and clarifying questions to understand more.  Pt's interpersonal interactions appeared to be appropriate to the facilitator and peers in the group.     Omelia Blackwater, MSW, [place] phone [phone] work mobile

## 2023-07-21 ENCOUNTER — Other Ambulatory Visit: Payer: Self-pay

## 2023-07-21 ENCOUNTER — Encounter (HOSPITAL_COMMUNITY): Payer: Self-pay

## 2023-07-21 ENCOUNTER — Emergency Department (HOSPITAL_COMMUNITY): Payer: No Typology Code available for payment source

## 2023-07-21 ENCOUNTER — Emergency Department (HOSPITAL_COMMUNITY)
Admission: EM | Admit: 2023-07-21 | Discharge: 2023-07-21 | Disposition: A | Discharge status: Home / Self Care | Payer: No Typology Code available for payment source | Attending: Emergency Medicine | Admitting: Emergency Medicine

## 2023-07-21 DIAGNOSIS — E119 Type 2 diabetes mellitus without complications: Secondary | ICD-10-CM | POA: Diagnosis not present

## 2023-07-21 DIAGNOSIS — I10 Essential (primary) hypertension: Secondary | ICD-10-CM | POA: Diagnosis not present

## 2023-07-21 DIAGNOSIS — R55 Syncope and collapse: Secondary | ICD-10-CM | POA: Diagnosis present

## 2023-07-21 DIAGNOSIS — Z79899 Other long term (current) drug therapy: Secondary | ICD-10-CM | POA: Diagnosis not present

## 2023-07-21 DIAGNOSIS — Z720 Tobacco use: Secondary | ICD-10-CM | POA: Insufficient documentation

## 2023-07-21 DIAGNOSIS — Z794 Long term (current) use of insulin: Secondary | ICD-10-CM | POA: Diagnosis not present

## 2023-07-21 LAB — CBC
HCT: 45.5 % (ref 39.0–52.0)
Hemoglobin: 15.5 g/dL (ref 13.0–17.0)
MCH: 30.5 pg (ref 26.0–34.0)
MCHC: 34.1 g/dL (ref 30.0–36.0)
MCV: 89.6 fL (ref 80.0–100.0)
Platelets: 179 10*3/uL (ref 150–400)
RBC: 5.08 MIL/uL (ref 4.22–5.81)
RDW: 12.2 % (ref 11.5–15.5)
WBC: 9.8 10*3/uL (ref 4.0–10.5)
nRBC: 0 % (ref 0.0–0.2)

## 2023-07-21 LAB — COMPREHENSIVE METABOLIC PANEL
ALT: 33 U/L (ref 0–44)
AST: 43 U/L — ABNORMAL HIGH (ref 15–41)
Albumin: 4.3 g/dL (ref 3.5–5.0)
Alkaline Phosphatase: 36 U/L — ABNORMAL LOW (ref 38–126)
Anion gap: 11 (ref 5–15)
BUN: 11 mg/dL (ref 6–20)
CO2: 19 mmol/L — ABNORMAL LOW (ref 22–32)
Calcium: 9 mg/dL (ref 8.9–10.3)
Chloride: 103 mmol/L (ref 98–111)
Creatinine, Ser: 1.12 mg/dL (ref 0.61–1.24)
GFR, Estimated: >60 mL/min (ref >60)
Glucose, Bld: 111 mg/dL — ABNORMAL HIGH (ref 70–99)
Potassium: 4 mmol/L (ref 3.5–5.1)
Sodium: 133 mmol/L — ABNORMAL LOW (ref 135–145)
Total Bilirubin: 0.7 mg/dL (ref 0.3–1.2)
Total Protein: 6.7 g/dL (ref 6.5–8.1)

## 2023-07-21 LAB — PROTIME-INR
INR: 1.1 (ref 0.8–1.2)
Prothrombin Time: 14 seconds (ref 11.4–15.2)

## 2023-07-21 LAB — DIFFERENTIAL
Abs Immature Granulocytes: 0.04 10*3/uL (ref 0.00–0.07)
Basophils Absolute: 0 10*3/uL (ref 0.0–0.1)
Basophils Relative: 0 %
Eosinophils Absolute: 0.1 10*3/uL (ref 0.0–0.5)
Eosinophils Relative: 1 %
Immature Granulocytes: 0 %
Lymphocytes Relative: 19 %
Lymphs Abs: 1.9 10*3/uL (ref 0.7–4.0)
Monocytes Absolute: 0.7 10*3/uL (ref 0.1–1.0)
Monocytes Relative: 7 %
Neutro Abs: 7.1 10*3/uL (ref 1.7–7.7)
Neutrophils Relative %: 73 %

## 2023-07-21 LAB — I-STAT CHEM 8, ED
BUN: 12 mg/dL (ref 6–20)
Calcium, Ion: 1.06 mmol/L — ABNORMAL LOW (ref 1.15–1.40)
Chloride: 104 mmol/L (ref 98–111)
Creatinine, Ser: 1.1 mg/dL (ref 0.61–1.24)
Glucose, Bld: 110 mg/dL — ABNORMAL HIGH (ref 70–99)
HCT: 47 % (ref 39.0–52.0)
Hemoglobin: 16 g/dL (ref 13.0–17.0)
Potassium: 4 mmol/L (ref 3.5–5.1)
Sodium: 133 mmol/L — ABNORMAL LOW (ref 135–145)
TCO2: 20 mmol/L — ABNORMAL LOW (ref 22–32)

## 2023-07-21 LAB — TROPONIN I (HIGH SENSITIVITY)
Troponin I (High Sensitivity): 3 ng/L (ref <18)
Troponin I (High Sensitivity): 5 ng/L (ref <18)

## 2023-07-21 LAB — APTT: aPTT: 27 seconds (ref 24–36)

## 2023-07-21 LAB — ETHANOL: Alcohol, Ethyl (B): <10 mg/dL (ref <10)

## 2023-07-21 LAB — CBG MONITORING, ED: Glucose-Capillary: 97 mg/dL (ref 70–99)

## 2023-07-21 MED ORDER — SODIUM CHLORIDE 0.9% FLUSH: 3.0000 mL | Freq: Once | INTRAVENOUS | Status: DC

## 2023-07-21 NOTE — ED Triage Notes (Signed)
Reports doctors have changed his BP meds and thought his meds were causing him to have weakness and lightheadedness and vision changes.  Reports he also has anxiety.  Patient reports BP has been elevated.  ALso reports coming out of [employer] today had bilateral leg weakness trouble swallowing and couldn't answer his step dads questions.  Neuro symptoms have resolved at this time.

## 2023-07-21 NOTE — Discharge Instructions (Addendum)
You were seen in the emergency department today for a near syncopal or passing out episode.  As we discussed your blood work and CT scan looked very reassuring.  Your electrolytes were normal, and we did not see any evidence of increased demand on your heart.  I strongly recommend that you talk to your primary doctor regarding your blood pressure medication, anxiety medication, and evaluation for possible neuropathy.  Some of the medications that were you can be started on for anxiety do require close follow-up, which is why feel this would be best done by your primary doctor.  Continue to monitor how you're doing and return to the ER for new or worsening symptoms.

## 2023-07-21 NOTE — ED Provider Triage Note (Signed)
Emergency Medicine Provider Triage Evaluation Note  Oscar Ruiz , a 47 y.o. male  was evaluated in triage.  Pt complains of episode of near syncope today.  Patient reports that around noon he was at [facility] when he was speaking to his stepdad and had a momentary less than 5 seconds trouble with speaking and swallowing.  He reports that he felt near syncopal in his knees as well.  He reports that he has been having the near syncopal with bilateral leg weakness off and on for months.  He reports has been having headaches off and on for months as well.  Last of his blood pressure medication was changed was a year ago but he reports he was having symptoms with his blood pressure medication management before.  He thinks he may be having anxiety but wanted to make sure he was not having a stroke.  He denies any chest pain, shortness breath, or any palpitations.  He denies any actual syncope.  No room spinning sensation.  Nuys any visual changes.  Review of Systems  Positive:  Negative:   Physical Exam  BP (!) 167/95   Pulse 76   Temp 97.7 F (36.5 C) (Oral)   Resp (!) 21   Ht 5\' 8"  (1.727 m)   Wt 98.9 kg   SpO2 100%   BMI 33.15 kg/m  Gen:   Awake, no distress   Resp:  Normal effort  MSK:   Moves extremities without difficulty  Other:  Cranial nerves II through XII intact.  No facial droop noted.  Patient answering questions appropriately with appropriate speech.  Strength and sensation grossly intact as well.  Palpable radial pulses.  Medical Decision Making  Medically screening exam initiated at 2:04 PM.  Appropriate orders placed.  Oscar Ruiz was informed that the remainder of the evaluation will be completed by another provider, this initial triage assessment does not replace that evaluation, and the importance of remaining in the ED until their evaluation is complete.  Patient had symptoms for momentary.  Is not having any symptoms now.  Will order a CT of his head and add on  troponin for near syncopal event.  He does not have any focal neurodeficits and is currently not have any trouble speaking or swallowing.  I do not think this is a code stroke needed at this time.   Oscar Ruiz, [place] 07/21/23 [phone]

## 2023-07-21 NOTE — ED Provider Notes (Signed)
[place] EMERGENCY DEPARTMENT AT [facility] Provider Note   CSN: [id number] Arrival date & time: 07/21/23  1137     History  Chief Complaint  Patient presents with   Neurologic Problem    Oscar Ruiz is a 47 y.o. male with hypertension, seizures, tobacco use, alcohol use, anxiety, diabetes, hyperlipidemia, who presents emergency department after near syncopal episode.  Patient was at the store when he started feeling very lightheaded.  He states that he was speaking to his stepdad, and then had an episode in which she was having trouble speaking, trouble swallowing, and both of his legs felt numb.  He did almost lose consciousness.  He states that he has had episodes like this in the past that he usually attributed to his anxiety.  He states he has been feeling more anxious in the past couple months.  He also has been intermittently having symptoms after his blood pressure medication was changed last year.  He thinks his symptoms are related to anxiety, but wanted to come to the ER to rule out a stroke.  He denied any chest pain, shortness of breath, palpitations, syncope, dizziness, visual changes.  He has no symptoms at present.    Neurologic Problem       Home Medications Prior to Admission medications   Medication Sig Start Date End Date Taking? Authorizing Provider  busPIRone (BUSPAR) 30 MG tablet Take 1 tablet (30 mg total) by mouth 2 (two) times daily. 12/20/19   Melony Overly T, PA-C  desmopressin (DDAVP) 0.2 MG tablet Take 200 mcg by mouth at bedtime. 05/02/20   [provider]  fenofibrate 160 MG tablet Take 1 tablet (160 mg total) by mouth daily. 08/17/22 09/16/22  Princess Bruins, DO  hydrALAZINE (APRESOLINE) 10 MG tablet Take 10 mg by mouth 3 (three) times daily. Pt takes 0ne  10 mg tablet every morning and one 10mg  tablet at lunch and  two 10 mg  tablets at night    [provider]  insulin glargine (LANTUS SOLOSTAR) 100 UNIT/ML Solostar  Pen Inject 12 Units into the skin every morning. And pen needles 1/day 04/04/20   Romero Belling, MD  latanoprost (XALATAN) 0.005 % ophthalmic solution Place 1 drop into both eyes at bedtime.    [provider]  losartan (COZAAR) 100 MG tablet Take 100 mg by mouth daily.    [provider]  metoprolol succinate (TOPROL-XL) 200 MG 24 hr tablet Take 1 tablet (200 mg total) by mouth daily. Take with or immediately following a meal. 08/17/22 09/16/22  Princess Bruins, DO  Omega-3 Fatty Acids (FISH OIL) 1000 MG CAPS Take 2 capsules (2,000 mg total) by mouth 2 (two) times daily. Patient not taking: Reported on 08/15/2022 11/01/19   Jake Bathe, MD  PARoxetine (PAXIL) 30 MG tablet Take 1 tablet (30 mg total) by mouth 2 (two) times daily. 08/16/22 09/15/22  Princess Bruins, DO  rosuvastatin (CRESTOR) 40 MG tablet Take 1 tablet (40 mg total) by mouth at bedtime. 08/16/22 09/15/22  Princess Bruins, DO  thiamine (VITAMIN B-1) 100 MG tablet Take 1 tablet (100 mg total) by mouth daily. 08/17/22 09/16/22  Princess Bruins, DO      Allergies    Bupropion and Gabapentin    Review of Systems   Review of Systems  Neurological:  Positive for weakness and light-headedness.  Psychiatric/Behavioral:  The patient is nervous/anxious.   All other systems reviewed and are negative.   Physical Exam Updated Vital Signs BP Marland Kitchen)  156/88 (BP Location: Right Arm)   Pulse 71   Temp 98.5 F (36.9 C) (Oral)   Resp 18   Ht 5\' 8"  (1.727 m)   Wt 98.9 kg   SpO2 96%   BMI 33.15 kg/m  Physical Exam Vitals and nursing note reviewed.  Constitutional:      Appearance: Normal appearance.  HENT:     Head: Normocephalic and atraumatic.  Eyes:     Conjunctiva/sclera: Conjunctivae normal.  Cardiovascular:     Rate and Rhythm: Normal rate and regular rhythm.  Pulmonary:     Effort: Pulmonary effort is normal. No respiratory distress.     Breath sounds: Normal breath sounds.  Abdominal:     General: There is no  distension.     Palpations: Abdomen is soft.     Tenderness: There is no abdominal tenderness.  Skin:    General: Skin is warm and dry.  Neurological:     General: No focal deficit present.     Mental Status: He is alert.     Comments: Neuro: Speech is clear, able to follow commands. CN III-XII intact grossly intact. PERRLA. EOMI. Sensation intact throughout. Str 5/5 all extremities.      ED Results / Procedures / Treatments   Labs (all labs ordered are listed, but only abnormal results are displayed) Labs Reviewed  COMPREHENSIVE METABOLIC PANEL - Abnormal; Notable for the following components:      Result Value   Sodium 133 (*)    CO2 19 (*)    Glucose, Bld 111 (*)    AST 43 (*)    Alkaline Phosphatase 36 (*)    All other components within normal limits  I-STAT CHEM 8, ED - Abnormal; Notable for the following components:   Sodium 133 (*)    Glucose, Bld 110 (*)    Calcium, Ion 1.06 (*)    TCO2 20 (*)    All other components within normal limits  PROTIME-INR  APTT  CBC  DIFFERENTIAL  ETHANOL  CBG MONITORING, ED  TROPONIN I (HIGH SENSITIVITY)  TROPONIN I (HIGH SENSITIVITY)    EKG EKG Interpretation Date/Time:  Thursday July 21 2023 12:23:10 EDT Ventricular Rate:  74 PR Interval:  182 QRS Duration:  84 QT Interval:  362 QTC Calculation: 401 R Axis:   79  Text Interpretation: Normal sinus rhythm Normal ECG When compared with ECG of 26-Jul-2014 11:56, PREVIOUS ECG IS PRESENT Nonspecific ST and T wave abnormality RESOLVED SINCE PREVIOUS Confirmed by Gwyneth Sprout ([id number]) on 07/21/2023 8:07:47 PM  Radiology CT HEAD WO CONTRAST  Result Date: 07/21/2023 CLINICAL DATA:  Episode of bilateral leg weakness and difficulty answering questions earlier today. Vision changes and lightheadedness. EXAM: CT HEAD WITHOUT CONTRAST TECHNIQUE: Contiguous axial images were obtained from the base of the skull through the vertex without intravenous contrast. RADIATION DOSE  REDUCTION: This exam was performed according to the departmental dose-optimization program which includes automated exposure control, adjustment of the mA and/or kV according to patient size and/or use of iterative reconstruction technique. COMPARISON:  CT head report dated July 24, 2022. MRI brain dated June 05, 2008. FINDINGS: Brain: No evidence of acute infarction, hemorrhage, hydrocephalus, extra-axial collection or mass lesion/mass effect. Vascular: Atherosclerotic vascular calcification of the carotid siphons. No hyperdense vessel. Skull: Normal. Negative for fracture or focal lesion. Sinuses/Orbits: No acute finding. Old medial right orbital wall fracture again noted. Other: None. IMPRESSION: 1. No acute intracranial abnormality. Electronically Signed   By: Vickki Hearing.D.  On: 07/21/2023 15:22    Procedures Procedures    Medications Ordered in ED Medications  sodium chloride flush (NS) 0.9 % injection 3 mL (has no administration in time range)    ED Course/ Medical Decision Making/ A&P                                 Medical Decision Making Amount and/or Complexity of Data Reviewed Labs: ordered. Radiology: ordered.  This patient is a 47 y.o. male  who presents to the ED for concern of near syncopal episode.   Differential diagnoses prior to evaluation: The emergent differential diagnosis includes, but is not limited to,  syncope, ACS/MI, CVA/TIA, anxiety, arrhythmia. This is not an exhaustive differential.   Past Medical History / Co-morbidities / Social History: hypertension, seizures, tobacco use, alcohol use, anxiety, diabetes, hyperlipidemia  Physical Exam: Physical exam performed. The pertinent findings include: Hypertensive, otherwise normal vital signs.  In no acute distress.  Normal neurologic exam as above.  Lab Tests/Imaging studies: I personally interpreted labs/imaging and the pertinent results include: CBC and CMP grossly unremarkable.  Negative  ethanol.  Flat negative troponins.  CT head without acute abnormalities.. I agree with the radiologist interpretation.  Cardiac monitoring: EKG obtained and interpreted by myself and attending physician which shows: NSR   Disposition: After consideration of the diagnostic results and the patients response to treatment, I feel that emergency department workup does not suggest an emergent condition requiring admission or immediate intervention beyond what has been performed at this time. The plan is: Discharge to home with reassurance.  Low concern for ACS or MI as cause of symptoms as patient had no acute ischemic changes on EKG with negative troponins.  Never had any chest pain.  I did have a thorough discussion with patient about his workup and the possibility for a TIA as the cause of his symptoms.  Although I believe it is less likely at this time, I did offer MRI brain.  Patient feels strongly that his symptoms were related to anxiety.  He states he has been struggling with worsening anxiety in the past couple months.  He does plan to follow-up with his primary doctor regarding his blood pressure medication and anxiety medication.  He would like to go home and does not want any further evaluation at this time.  I think this is reasonable and he demonstrates capacity to make decisions. The patient is safe for discharge and has been instructed to return immediately for worsening symptoms, change in symptoms or any other concerns.  Final Clinical Impression(s) / ED Diagnoses Final diagnoses:  Near syncope    Rx / DC Orders ED Discharge Orders     None      Portions of this report may have been transcribed using voice recognition software. Every effort was made to ensure accuracy; however, inadvertent computerized transcription errors may be present.    Jeanella Flattery 07/21/23 2255    Gwyneth Sprout, MD 07/22/23 [phone]

## 2024-10-20 ENCOUNTER — Emergency Department (HOSPITAL_BASED_OUTPATIENT_CLINIC_OR_DEPARTMENT_OTHER)
Admission: EM | Admit: 2024-10-20 | Discharge: 2024-10-20 | Disposition: A | Discharge status: Home / Self Care | Attending: Emergency Medicine | Admitting: Emergency Medicine

## 2024-10-20 ENCOUNTER — Other Ambulatory Visit: Payer: Self-pay

## 2024-10-20 ENCOUNTER — Encounter (HOSPITAL_BASED_OUTPATIENT_CLINIC_OR_DEPARTMENT_OTHER): Payer: Self-pay | Admitting: Emergency Medicine

## 2024-10-20 ENCOUNTER — Emergency Department (HOSPITAL_BASED_OUTPATIENT_CLINIC_OR_DEPARTMENT_OTHER)

## 2024-10-20 DIAGNOSIS — M79671 Pain in right foot: Secondary | ICD-10-CM | POA: Diagnosis present

## 2024-10-20 DIAGNOSIS — S93621A Sprain of tarsometatarsal ligament of right foot, initial encounter: Secondary | ICD-10-CM | POA: Diagnosis not present

## 2024-10-20 DIAGNOSIS — W109XXA Fall (on) (from) unspecified stairs and steps, initial encounter: Secondary | ICD-10-CM | POA: Insufficient documentation

## 2024-10-20 DIAGNOSIS — Z794 Long term (current) use of insulin: Secondary | ICD-10-CM | POA: Diagnosis not present

## 2024-10-20 DIAGNOSIS — S92301A Fracture of unspecified metatarsal bone(s), right foot, initial encounter for closed fracture: Secondary | ICD-10-CM

## 2024-10-20 DIAGNOSIS — S92344A Nondisplaced fracture of fourth metatarsal bone, right foot, initial encounter for closed fracture: Secondary | ICD-10-CM | POA: Insufficient documentation

## 2024-10-20 DIAGNOSIS — E119 Type 2 diabetes mellitus without complications: Secondary | ICD-10-CM | POA: Diagnosis not present

## 2024-10-20 MED ORDER — IBUPROFEN 800 MG PO TABS
800.0000 mg | ORAL_TABLET | Freq: Once | ORAL | Status: AC
  Administered 2024-10-20: 800 mg via ORAL
  Filled 2024-10-20: qty 1

## 2024-10-20 NOTE — ED Triage Notes (Signed)
 Pt endorses mechanical fall afater tripping on stone at home last night. Pt denies head injury denies thinners. Pt c/o RT foot pain, swelling noted

## 2024-10-20 NOTE — ED Notes (Signed)
 Consult canceled by EDP Yolande

## 2024-10-20 NOTE — Discharge Instructions (Addendum)
 You were seen for your foot fractures in the emergency department.   At home, please ice and elevate your foot.  Do not bear weight on it (for 6 weeks or until you are told to do so by orthopedics) and use the crutches to get around.    Check your MyChart online for the results of any tests that had not resulted by the time you left the emergency department.   Follow-up with your primary doctor in 2-3 days regarding your visit.  Follow-up with orthopedic surgeons to soon as possible.  Return immediately to the emergency department if you experience any of the following: Worsening pain, or any other concerning symptoms.    Thank you for visiting our Emergency Department. It was a pleasure taking care of you today.

## 2024-10-20 NOTE — ED Provider Notes (Addendum)
 [place] EMERGENCY DEPARTMENT AT [facility] Provider Note   CSN: [id number] Arrival date & time: 10/20/24  0800     Patient presents with: Foot Pain   Oscar Ruiz is a 48 y.o. male.   48 year old male history of diabetes who presents to the emergency department foot pain.  Last night the patient was stepping down from his porch and missed a step.  He landed on the ball of his right foot and felt immediate pain.  Has been having swelling to the dorsum of his foot.  No ankle pain.  Unable to bear weight due to the pain.       Prior to Admission medications   Medication Sig Start Date End Date Taking? Authorizing Provider  busPIRone  (BUSPAR ) 30 MG tablet Take 1 tablet (30 mg total) by mouth 2 (two) times daily. 12/20/19   Rhys Verneita DASEN, PA-C  desmopressin  (DDAVP ) 0.2 MG tablet Take 200 mcg by mouth at bedtime. 05/02/20   [provider]  fenofibrate  160 MG tablet Take 1 tablet (160 mg total) by mouth daily. 08/17/22 09/16/22  Nguyen, Julie, DO  hydrALAZINE  (APRESOLINE ) 10 MG tablet Take 10 mg by mouth 3 (three) times daily. Pt takes 0ne  10 mg tablet every morning and one 10mg  tablet at lunch and  two 10 mg  tablets at night    [provider]  insulin  glargine (LANTUS  SOLOSTAR) 100 UNIT/ML Solostar Pen Inject 12 Units into the skin every morning. And pen needles 1/day 04/04/20   Kassie Mallick, MD  latanoprost (XALATAN) 0.005 % ophthalmic solution Place 1 drop into both eyes at bedtime.    [provider]  losartan  (COZAAR ) 100 MG tablet Take 100 mg by mouth daily.    [provider]  metoprolol  succinate (TOPROL -XL) 200 MG 24 hr tablet Take 1 tablet (200 mg total) by mouth daily. Take with or immediately following a meal. 08/17/22 09/16/22  Nguyen, Julie, DO  Omega-3 Fatty Acids (FISH OIL ) 1000 MG CAPS Take 2 capsules (2,000 mg total) by mouth 2 (two) times daily. Patient not taking: Reported on 08/15/2022 11/01/19   Jeffrie Oneil BROCKS, MD   PARoxetine  (PAXIL ) 30 MG tablet Take 1 tablet (30 mg total) by mouth 2 (two) times daily. 08/16/22 09/15/22  Nguyen, Julie, DO  rosuvastatin  (CRESTOR ) 40 MG tablet Take 1 tablet (40 mg total) by mouth at bedtime. 08/16/22 09/15/22  Nguyen, Julie, DO  thiamine  (VITAMIN B-1) 100 MG tablet Take 1 tablet (100 mg total) by mouth daily. 08/17/22 09/16/22  Nguyen, Julie, DO    Allergies: Bupropion  and Gabapentin     Review of Systems  Updated Vital Signs BP (!) 154/82 (BP Location: Right Arm)   Pulse 80   Temp 98.2 F (36.8 C) (Oral)   Resp 18   Ht 5' 8 (1.727 m)   Wt 90.7 kg   SpO2 99%   BMI 30.41 kg/m   Physical Exam Musculoskeletal:     Comments: No tenderness to palpation of the distal fibula or tibia on the right side.  Significant amount of swelling to the dorsum of the right foot.  Tenderness to palpation along the MTP joints of the right foot.  Bruising to the dorsum of the right foot.  Capillary refill 2 to 3 seconds in all digits of the right foot.  DP pulse 2+.  Compartments of the foot are soft.     (all labs ordered are listed, but only abnormal results are displayed) Labs Reviewed - No data  to display  EKG: None  Radiology: CT Foot Right Wo Contrast Result Date: 10/20/2024 CLINICAL DATA:  Patient tripped on a stone. Patient presented today with mid foot pain. Suspected fracture at the base of the fourth metatarsal noted on radiographs. Clinical suspicion for Lisfranc fracture. EXAM: CT OF THE RIGHT FOOT WITHOUT CONTRAST TECHNIQUE: Multidetector CT imaging of the right foot was performed according to the standard protocol. Multiplanar CT image reconstructions were also generated. RADIATION DOSE REDUCTION: This exam was performed according to the departmental dose-optimization program which includes automated exposure control, adjustment of the mA and/or kV according to patient size and/or use of iterative reconstruction technique. COMPARISON:  Current right foot radiographs.  FINDINGS: Bones/Joint/Cartilage There are multiple midfoot fractures. Numerous small fracture fragments that lie along the inferior, lateral margin the medial cuneiform and plantar medial margin of the base of the second metatarsal. Few small avulsion fractures are noted from the superior, lateral margin of the medial cuneiform. Despite these fractures, at the origin and attachment the Lisfranc ligaments, there is no displacement/widening between the first and second metatarsals or medial cuneiform and second metatarsal. Small avulsion fractures are noted from dorsal lateral margins of the middle and lateral cuneiforms. There is a small nondisplaced avulsion fracture from the dorsal lateral margin of the distal cuboid. Nondisplaced oblique fracture is noted at the base of the fourth metacarpal that extends to the cuboid metatarsal articulation. All tarsal metatarsal articulations are normally aligned. Ligaments Suboptimally assessed by CT. Muscles and Tendons Muscles are grossly unremarkable.  Visualized tendons appear intact. Soft tissues Diffuse soft tissue edema. IMPRESSION: 1. Midfoot fractures as described. Multiple avulsion fractures involving medial cuneiform and base of the second metatarsal consistent with Lisfranc fractures. No displacement/widening between the first and second metatarsals. 2. Additional small avulsion fractures from the middle and lateral cuneiforms and cuboid. 3. Nondisplaced intra-articular fracture at the base of the fourth metatarsal. 4. Foot joints normally spaced and aligned. Electronically Signed   By: Alm Parkins M.D.   On: 10/20/2024 11:59   DG Foot Complete Right Result Date: 10/20/2024 CLINICAL DATA:  Mid foot pain after tripping on a stone EXAM: RIGHT FOOT COMPLETE - 3 VIEW COMPARISON:  None Available. FINDINGS: Faint irregular lucencies present within the base of the fourth metatarsal. Findings are favored to reflect a nondisplaced fracture. There is associated soft  tissue swelling in the dorsal midfoot. IMPRESSION: Suspect nondisplaced fracture of the base of the fourth metatarsal. Electronically Signed   By: Wilkie Lent M.D.   On: 10/20/2024 09:12     .Splint Application  Date/Time: 10/20/2024 12:43 PM  Performed by: Yolande Lamar BROCKS, MD Authorized by: Yolande Lamar BROCKS, MD   Consent:    Consent obtained:  Verbal   Consent given by:  Patient Universal protocol:    Patient identity confirmed:  Verbally with patient Pre-procedure details:    Distal neurologic exam:  Normal   Distal perfusion: distal pulses strong and brisk capillary refill   Procedure details:    Location:  Ankle   Ankle location:  R ankle   Cast type:  Short leg Post-procedure details:    Distal neurologic exam:  Normal   Distal perfusion: distal pulses strong and brisk capillary refill     Procedure completion:  Tolerated well, no immediate complications    Medications Ordered in the ED  ibuprofen  (ADVIL ) tablet 800 mg (800 mg Oral Given 10/20/24 0840)  Medical Decision Making Amount and/or Complexity of Data Reviewed Radiology: ordered.  Risk Prescription drug management.   48 year old male history of diabetes presents emergency department with right foot pain  Initial Ddx:  Metatarsal fracture, Lisfranc injury, compartment syndrome, ankle fracture  MDM/Course:  Patient presents emergency department with foot  pain.  He landed on the ball of his foot after missing a step.  Does have significant mild swelling but no signs of compartment syndrome.  Neurovascular intact otherwise.  X-ray showed a fourth metatarsal fracture and follow-up CT did indeed show Lisfranc injury with several other midfoot fractures.  Was placed in a posterior short leg splint and given crutches.  Instructed to be nonweightbearing for the next 6 weeks and to follow-up with orthopedics.  Upon re-evaluation pain remained controlled after ibuprofen .   He declined any prescription for narcotics.  Heritage manager from [facility] orthopedics messaged regarding scheduling follow-up.  This patient presents to the ED for concern of complaints listed in HPI, this involves an extensive number of treatment options, and is a complaint that carries with it a high risk of complications and morbidity. Disposition including potential need for admission considered.   Dispo: DC Home. Return precautions discussed including, but not limited to, those listed in the AVS. Allowed pt time to ask questions which were answered fully prior to dc.  Additional history obtained from friend Records reviewed Outpatient Clinic Notes I independently reviewed the following imaging with scope of interpretation limited to determining acute life threatening conditions related to emergency care: Extremity x-ray(s) and agree with the radiologist interpretation with the following exceptions: none I have reviewed the patients home medications and made adjustments as needed  Portions of this note were generated with Dragon dictation software. Dictation errors may occur despite best attempts at proofreading.     Final diagnoses:  Sprain of ligament of tarsometatarsal joint of right foot, initial encounter  Closed nondisplaced fracture of metatarsal bone of right foot, unspecified metatarsal, initial encounter    ED Discharge Orders     None          Yolande Lamar BROCKS, MD 10/20/24 1225    Yolande Lamar BROCKS, MD 10/20/24 1244

## 2024-10-22 ENCOUNTER — Telehealth (HOSPITAL_BASED_OUTPATIENT_CLINIC_OR_DEPARTMENT_OTHER): Payer: Self-pay
